# Patient Record
Sex: Male | Born: 1955 | Race: White | Hispanic: No | Marital: Married | Smoking: Former smoker
Health system: Southern US, Community
[De-identification: ages and names within clinical notes are randomized; demographics above are authoritative.]

## PROBLEM LIST (undated history)

## (undated) DIAGNOSIS — M549 Dorsalgia, unspecified: Secondary | ICD-10-CM

## (undated) DIAGNOSIS — E785 Hyperlipidemia, unspecified: Secondary | ICD-10-CM

## (undated) DIAGNOSIS — M1990 Unspecified osteoarthritis, unspecified site: Secondary | ICD-10-CM

## (undated) DIAGNOSIS — K219 Gastro-esophageal reflux disease without esophagitis: Secondary | ICD-10-CM

## (undated) DIAGNOSIS — B019 Varicella without complication: Secondary | ICD-10-CM

## (undated) DIAGNOSIS — E079 Disorder of thyroid, unspecified: Secondary | ICD-10-CM

## (undated) DIAGNOSIS — Z86010 Personal history of colonic polyps: Secondary | ICD-10-CM

## (undated) DIAGNOSIS — Z8619 Personal history of other infectious and parasitic diseases: Secondary | ICD-10-CM

## (undated) DIAGNOSIS — R5383 Other fatigue: Secondary | ICD-10-CM

## (undated) DIAGNOSIS — Z801 Family history of malignant neoplasm of trachea, bronchus and lung: Secondary | ICD-10-CM

## (undated) DIAGNOSIS — R748 Abnormal levels of other serum enzymes: Secondary | ICD-10-CM

## (undated) DIAGNOSIS — Z8042 Family history of malignant neoplasm of prostate: Secondary | ICD-10-CM

## (undated) DIAGNOSIS — I7300 Raynaud's syndrome without gangrene: Secondary | ICD-10-CM

## (undated) DIAGNOSIS — I2510 Atherosclerotic heart disease of native coronary artery without angina pectoris: Secondary | ICD-10-CM

## (undated) DIAGNOSIS — R55 Syncope and collapse: Secondary | ICD-10-CM

## (undated) DIAGNOSIS — N486 Induration penis plastica: Secondary | ICD-10-CM

## (undated) DIAGNOSIS — Z0000 Encounter for general adult medical examination without abnormal findings: Secondary | ICD-10-CM

## (undated) DIAGNOSIS — R0989 Other specified symptoms and signs involving the circulatory and respiratory systems: Secondary | ICD-10-CM

## (undated) DIAGNOSIS — K5900 Constipation, unspecified: Secondary | ICD-10-CM

## (undated) DIAGNOSIS — G709 Myoneural disorder, unspecified: Secondary | ICD-10-CM

## (undated) DIAGNOSIS — R739 Hyperglycemia, unspecified: Secondary | ICD-10-CM

## (undated) DIAGNOSIS — F419 Anxiety disorder, unspecified: Secondary | ICD-10-CM

## (undated) DIAGNOSIS — F418 Other specified anxiety disorders: Secondary | ICD-10-CM

## (undated) DIAGNOSIS — F1010 Alcohol abuse, uncomplicated: Secondary | ICD-10-CM

## (undated) DIAGNOSIS — Z889 Allergy status to unspecified drugs, medicaments and biological substances status: Secondary | ICD-10-CM

## (undated) DIAGNOSIS — E663 Overweight: Secondary | ICD-10-CM

## (undated) DIAGNOSIS — H9190 Unspecified hearing loss, unspecified ear: Secondary | ICD-10-CM

## (undated) DIAGNOSIS — R030 Elevated blood-pressure reading, without diagnosis of hypertension: Secondary | ICD-10-CM

## (undated) DIAGNOSIS — I10 Essential (primary) hypertension: Secondary | ICD-10-CM

## (undated) DIAGNOSIS — R001 Bradycardia, unspecified: Secondary | ICD-10-CM

## (undated) DIAGNOSIS — E039 Hypothyroidism, unspecified: Secondary | ICD-10-CM

## (undated) DIAGNOSIS — I499 Cardiac arrhythmia, unspecified: Secondary | ICD-10-CM

## (undated) DIAGNOSIS — Z800 Family history of malignant neoplasm of digestive organs: Secondary | ICD-10-CM

## (undated) DIAGNOSIS — F1011 Alcohol abuse, in remission: Secondary | ICD-10-CM

## (undated) HISTORY — DX: Family history of malignant neoplasm of trachea, bronchus and lung: Z80.1

## (undated) HISTORY — DX: Personal history of other infectious and parasitic diseases: Z86.19

## (undated) HISTORY — DX: Personal history of colonic polyps: Z86.010

## (undated) HISTORY — DX: Constipation, unspecified: K59.00

## (undated) HISTORY — DX: Essential (primary) hypertension: I10

## (undated) HISTORY — PX: COLONOSCOPY: SHX174

## (undated) HISTORY — DX: Bradycardia, unspecified: R00.1

## (undated) HISTORY — DX: Unspecified hearing loss, unspecified ear: H91.90

## (undated) HISTORY — DX: Dorsalgia, unspecified: M54.9

## (undated) HISTORY — DX: Family history of malignant neoplasm of prostate: Z80.42

## (undated) HISTORY — DX: Other specified anxiety disorders: F41.8

## (undated) HISTORY — DX: Elevated blood-pressure reading, without diagnosis of hypertension: R03.0

## (undated) HISTORY — DX: Disorder of thyroid, unspecified: E07.9

## (undated) HISTORY — PX: NO PAST SURGERIES: SHX2092

## (undated) HISTORY — DX: Family history of malignant neoplasm of digestive organs: Z80.0

## (undated) HISTORY — DX: Hyperglycemia, unspecified: R73.9

## (undated) HISTORY — PX: SKIN BIOPSY: SHX1

## (undated) HISTORY — DX: Raynaud's syndrome without gangrene: I73.00

## (undated) HISTORY — DX: Other fatigue: R53.83

## (undated) HISTORY — DX: Hyperlipidemia, unspecified: E78.5

## (undated) HISTORY — DX: Unspecified osteoarthritis, unspecified site: M19.90

## (undated) HISTORY — DX: Induration penis plastica: N48.6

## (undated) HISTORY — DX: Allergy status to unspecified drugs, medicaments and biological substances status: Z88.9

## (undated) HISTORY — DX: Overweight: E66.3

## (undated) HISTORY — DX: Varicella without complication: B01.9

## (undated) HISTORY — DX: Other specified symptoms and signs involving the circulatory and respiratory systems: R09.89

## (undated) HISTORY — DX: Alcohol abuse, in remission: F10.11

## (undated) HISTORY — DX: Encounter for general adult medical examination without abnormal findings: Z00.00

---

## 2007-04-17 DIAGNOSIS — Z860100 Personal history of colon polyps, unspecified: Secondary | ICD-10-CM

## 2007-04-17 HISTORY — DX: Personal history of colonic polyps: Z86.010

## 2007-04-17 HISTORY — DX: Personal history of colon polyps, unspecified: Z86.0100

## 2010-09-01 ENCOUNTER — Encounter: Payer: Self-pay | Admitting: Family Medicine

## 2010-09-01 ENCOUNTER — Ambulatory Visit (INDEPENDENT_AMBULATORY_CARE_PROVIDER_SITE_OTHER): Payer: BC Managed Care – PPO | Admitting: Family Medicine

## 2010-09-01 DIAGNOSIS — Z889 Allergy status to unspecified drugs, medicaments and biological substances status: Secondary | ICD-10-CM

## 2010-09-01 DIAGNOSIS — E079 Disorder of thyroid, unspecified: Secondary | ICD-10-CM

## 2010-09-01 DIAGNOSIS — E663 Overweight: Secondary | ICD-10-CM

## 2010-09-01 DIAGNOSIS — Z8619 Personal history of other infectious and parasitic diseases: Secondary | ICD-10-CM

## 2010-09-01 DIAGNOSIS — I10 Essential (primary) hypertension: Secondary | ICD-10-CM

## 2010-09-01 DIAGNOSIS — IMO0001 Reserved for inherently not codable concepts without codable children: Secondary | ICD-10-CM

## 2010-09-01 DIAGNOSIS — E785 Hyperlipidemia, unspecified: Secondary | ICD-10-CM

## 2010-09-01 DIAGNOSIS — Z9109 Other allergy status, other than to drugs and biological substances: Secondary | ICD-10-CM

## 2010-09-01 DIAGNOSIS — Z0000 Encounter for general adult medical examination without abnormal findings: Secondary | ICD-10-CM

## 2010-09-01 DIAGNOSIS — J321 Chronic frontal sinusitis: Secondary | ICD-10-CM

## 2010-09-01 DIAGNOSIS — R030 Elevated blood-pressure reading, without diagnosis of hypertension: Secondary | ICD-10-CM

## 2010-09-01 MED ORDER — LISINOPRIL 20 MG PO TABS: 20.0000 mg | ORAL_TABLET | Freq: Every day | ORAL | Status: DC

## 2010-09-01 MED ORDER — AZELASTINE HCL 0.15 % NA SOLN: 2.0000 | NASAL | Status: DC | PRN

## 2010-09-01 NOTE — Patient Instructions (Signed)

## 2010-09-02 ENCOUNTER — Encounter: Payer: Self-pay | Admitting: Family Medicine

## 2010-09-02 DIAGNOSIS — Z8619 Personal history of other infectious and parasitic diseases: Secondary | ICD-10-CM

## 2010-09-02 DIAGNOSIS — E663 Overweight: Secondary | ICD-10-CM

## 2010-09-02 DIAGNOSIS — I10 Essential (primary) hypertension: Secondary | ICD-10-CM | POA: Insufficient documentation

## 2010-09-02 DIAGNOSIS — IMO0001 Reserved for inherently not codable concepts without codable children: Secondary | ICD-10-CM

## 2010-09-02 DIAGNOSIS — Z889 Allergy status to unspecified drugs, medicaments and biological substances status: Secondary | ICD-10-CM

## 2010-09-02 HISTORY — DX: Overweight: E66.3

## 2010-09-02 HISTORY — DX: Personal history of other infectious and parasitic diseases: Z86.19

## 2010-09-02 HISTORY — DX: Allergy status to unspecified drugs, medicaments and biological substances: Z88.9

## 2010-09-02 HISTORY — DX: Essential (primary) hypertension: I10

## 2010-09-02 HISTORY — DX: Reserved for inherently not codable concepts without codable children: IMO0001

## 2010-09-02 NOTE — Assessment & Plan Note (Signed)
Patient reports a history of hypothyroidism which was treated for several months, but never followed up with repeat labs or ongoing prescriptiosn, will check a TSH level before prescribing any further meds

## 2010-09-03 DIAGNOSIS — E785 Hyperlipidemia, unspecified: Secondary | ICD-10-CM | POA: Insufficient documentation

## 2010-09-03 NOTE — Assessment & Plan Note (Signed)
Presently well controlled on Astepro. He report she has previously been treated with nasal steroids but develop excessive pressure in his eyes so they stopped these and his pressure returned to normal. Has moved here from [place] roughly 2 years ago and notes his allergies are much better here. He reports he had allergy testing done 10 years ago which did not show any obvious trigger. He has struggled intermittently was complication as well as chronic sinusitis in the frontal and maxillary regions. Consider a daily nonsedating antihistamine po as needed in future

## 2010-09-03 NOTE — Assessment & Plan Note (Signed)
Patient agrees to return for fasting labs next week. Encouraged to avoid trans fats and consider a fish oil and fiber supplement

## 2010-09-03 NOTE — Progress Notes (Signed)
Zachary Rowe [id number] 24-Jan-1955 09/03/2010      Progress Note New Patient  Subjective    Chief Complaint  Patient presents with  . Establish Care    new patient  Patient is a 55 year old Caucasian male in today to establish care. Moves to [place] roughly 2 years ago from [place]. Since moving here he has had very little difficulty with the allergies he suffered with in [place]. He used to have recurrent frontal and maxillary sinusitis and has suffered with very little of that.  Does not routinely take up oral antihistamines but does use Astepro daily and that seems to keep his congestion and pain and today.  No recent fevers, chills, ear pain, sore throat, chest pain, palpitations, shortness of breath, GI or GU complaints. He is a previous cigarette smoker but unfortunately does continue to chew tobacco intermittently. Past Medical History  Diagnosis Date  . Measles as a child  . Chicken pox as a child  . Chronic sinusitis 01-03-2000  . Hypertension   . Thyroid disease   . Hyperlipidemia   . Multiple allergies 09/02/2010  . History of chicken pox 09/02/2010  . History of measles 09/02/2010  . Overweight 09/02/2010  . Elevated BP 09/02/2010  . HTN (hypertension) 09/02/2010    Past Surgical History  Procedure Date  . Skin biopsy     BB in forehead    Family History  Problem Relation Age of Onset  . Hypertension Mother   . Diabetes Mother     borderline  . Neuropathy Mother   . Hypertension Father   . Diabetes Father     type 2  . Cancer Father 59    liver  . Diabetes Maternal Grandmother   . Emphysema Maternal Grandfather   . Stroke Paternal Grandfather     History   Social History  . Marital Status: Married    Spouse Name: N/A    Number of Children: N/A  . Years of Education: N/A   Occupational History  . Not on file.   Social History Main Topics  . Smoking status: Former Smoker    Types: Cigarettes    Quit date: 01/03/2007  . Smokeless  tobacco: Current User    Types: Chew   Comment: dip  . Alcohol Use: Yes     3-4 bottles of wine weekly  . Drug Use: No  . Sexually Active: Yes -- Male partner(s)   Other Topics Concern  . Not on file   Social History Narrative  . No narrative on file    No current outpatient prescriptions on file prior to visit.    No Known Allergies  Review of Systems  Review of Systems  Constitutional: Negative for fever, chills and malaise/fatigue.  HENT: Negative for hearing loss, nosebleeds and congestion.   Eyes: Negative for discharge.  Respiratory: Negative for cough, sputum production, shortness of breath and wheezing.   Cardiovascular: Negative for chest pain, palpitations and leg swelling.  Gastrointestinal: Negative for heartburn, nausea, vomiting, abdominal pain, diarrhea, constipation and blood in stool.  Genitourinary: Negative for dysuria, urgency, frequency and hematuria.  Musculoskeletal: Negative for myalgias, back pain and falls.  Skin: Negative for rash.  Neurological: Negative for dizziness, tremors, sensory change, focal weakness, loss of consciousness, weakness and headaches.  Endo/Heme/Allergies: Negative for polydipsia. Does not bruise/bleed easily.  Psychiatric/Behavioral: Negative for depression and suicidal ideas. The patient is not nervous/anxious and does not have insomnia.     Objective  BP 141/87  Pulse  60  Temp(Src) 98.1 F (36.7 C) (Oral)  Ht 6\' 3"  (1.905 m)  Wt 217 lb (98.431 kg)  BMI 27.12 kg/m2  SpO2 98%  Physical Exam  Physical Exam  Constitutional: He is oriented to person, place, and time and well-developed, well-nourished, and in no distress. No distress.  HENT:  Head: Normocephalic and atraumatic.  Right Ear: External ear normal.  Left Ear: External ear normal.  Nose: Nose normal.  Mouth/Throat: Oropharynx is clear and moist. No oropharyngeal exudate.  Eyes: Conjunctivae are normal. Pupils are equal, round, and reactive to light.  Right eye exhibits no discharge. Left eye exhibits no discharge. No scleral icterus.  Neck: Normal range of motion. Neck supple. No thyromegaly present.  Cardiovascular: Normal rate, regular rhythm and normal heart sounds.   No murmur heard. Pulmonary/Chest: Effort normal and breath sounds normal. No respiratory distress. He has no wheezes.  Abdominal: He exhibits no distension and no mass. There is no tenderness. There is no guarding.  Musculoskeletal: He exhibits no edema and no tenderness.  Lymphadenopathy:    He has no cervical adenopathy.  Neurological: He is alert and oriented to person, place, and time. He has normal reflexes. He displays normal reflexes. No cranial nerve deficit. He exhibits normal muscle tone. Gait normal. Coordination normal. GCS score is 15.  Skin: Skin is warm and dry. No rash noted. He is not diaphoretic. No erythema. No pallor.  Psychiatric: Mood, memory, affect and judgment normal.       Assessment & Plan  Thyroid disease Patient reports a history of hypothyroidism which was treated for several months, but never followed up with repeat labs or ongoing prescriptiosn, will check a TSH level before prescribing any further meds  Multiple allergies Presently well controlled on Astepro. He report she has previously been treated with nasal steroids but develop excessive pressure in his eyes so they stopped these and his pressure returned to normal. Has moved here from [place] roughly 2 years ago and notes his allergies are much better here. He reports he had allergy testing done 10 years ago which did not show any obvious trigger. He has struggled intermittently was complication as well as chronic sinusitis in the frontal and maxillary regions. Consider a daily nonsedating antihistamine po as needed in future  HTN (hypertension) Mild elevation on Lisinopril once daily. He is asked to minimize salt, get adequate sleep and we will reassess at next  visit.  Hyperlipidemia Patient agrees to return for fasting labs next week. Encouraged to avoid trans fats and consider a fish oil and fiber supplement

## 2010-09-03 NOTE — Assessment & Plan Note (Signed)
Mild elevation on Lisinopril once daily. He is asked to minimize salt, get adequate sleep and we will reassess at next visit.

## 2010-09-06 ENCOUNTER — Other Ambulatory Visit: Payer: BC Managed Care – PPO

## 2010-09-08 ENCOUNTER — Telehealth: Payer: Self-pay

## 2010-09-08 ENCOUNTER — Other Ambulatory Visit: Payer: Self-pay | Admitting: Family Medicine

## 2010-09-08 ENCOUNTER — Other Ambulatory Visit (INDEPENDENT_AMBULATORY_CARE_PROVIDER_SITE_OTHER): Payer: BC Managed Care – PPO

## 2010-09-08 DIAGNOSIS — Z0000 Encounter for general adult medical examination without abnormal findings: Secondary | ICD-10-CM

## 2010-09-08 DIAGNOSIS — E079 Disorder of thyroid, unspecified: Secondary | ICD-10-CM

## 2010-09-08 DIAGNOSIS — E785 Hyperlipidemia, unspecified: Secondary | ICD-10-CM

## 2010-09-08 DIAGNOSIS — I10 Essential (primary) hypertension: Secondary | ICD-10-CM

## 2010-09-08 LAB — RENAL FUNCTION PANEL
BUN: 17 mg/dL (ref 6–23)
CO2: 29 mEq/L (ref 19–32)
Creatinine, Ser: 1.2 mg/dL (ref 0.4–1.5)
GFR: 65.36 mL/min (ref >60.00)
Glucose, Bld: 98 mg/dL (ref 70–99)
Sodium: 139 mEq/L (ref 135–145)

## 2010-09-08 LAB — CBC WITH DIFFERENTIAL/PLATELET
Basophils Absolute: 0 10*3/uL (ref 0.0–0.1)
Basophils Relative: 0.5 % (ref 0.0–3.0)
Eosinophils Absolute: 0.2 10*3/uL (ref 0.0–0.7)
Hemoglobin: 14.3 g/dL (ref 13.0–17.0)
MCHC: 33.4 g/dL (ref 30.0–36.0)
MCV: 97.7 fl (ref 78.0–100.0)
Monocytes Absolute: 0.5 10*3/uL (ref 0.1–1.0)
Neutro Abs: 3.1 10*3/uL (ref 1.4–7.7)
Neutrophils Relative %: 61.5 % (ref 43.0–77.0)
RBC: 4.39 Mil/uL (ref 4.22–5.81)
RDW: 13.5 % (ref 11.5–14.6)

## 2010-09-08 LAB — HEPATIC FUNCTION PANEL
AST: 19 U/L (ref 0–37)
Albumin: 4.3 g/dL (ref 3.5–5.2)
Total Bilirubin: 0.7 mg/dL (ref 0.3–1.2)

## 2010-09-08 LAB — LIPID PANEL
Cholesterol: 208 mg/dL — ABNORMAL HIGH (ref 0–200)
Total CHOL/HDL Ratio: 3
Triglycerides: 53 mg/dL (ref 0.0–149.0)
VLDL: 10.6 mg/dL (ref 0.0–40.0)

## 2010-09-08 LAB — PSA: PSA: 0.33 ng/mL (ref 0.10–4.00)

## 2010-09-08 LAB — TSH: TSH: 8.04 u[IU]/mL — ABNORMAL HIGH (ref 0.35–5.50)

## 2010-09-08 NOTE — Telephone Encounter (Signed)
FYI: Pt would like future medication to go to [facility]

## 2010-09-09 ENCOUNTER — Telehealth: Payer: Self-pay

## 2010-09-09 DIAGNOSIS — I10 Essential (primary) hypertension: Secondary | ICD-10-CM

## 2010-09-09 MED ORDER — LISINOPRIL 20 MG PO TABS: 20.0000 mg | ORAL_TABLET | Freq: Every day | ORAL | Status: DC

## 2010-09-09 MED ORDER — LEVOTHYROXINE SODIUM 25 MCG PO TABS: 25.0000 ug | ORAL_TABLET | Freq: Every day | ORAL | Status: DC

## 2010-09-09 NOTE — Telephone Encounter (Signed)
Pt called and left a message stating that he would like md to return his call about the information he received about his labs earlier today. His callback number is [phone].

## 2010-09-09 NOTE — Telephone Encounter (Signed)
RX sent to [facility]

## 2010-09-09 NOTE — Telephone Encounter (Signed)
Spoke with patient over the phone, he agrees to start the Levothyroxine due to his subclinical hypothyroid. Also he is requesting a prescription of his Lisinopril get sent to [facility], he can have a 90 day supply with 3 rf

## 2010-11-01 ENCOUNTER — Encounter: Payer: Self-pay | Admitting: Family Medicine

## 2010-11-01 ENCOUNTER — Ambulatory Visit (INDEPENDENT_AMBULATORY_CARE_PROVIDER_SITE_OTHER): Payer: BC Managed Care – PPO | Admitting: Family Medicine

## 2010-11-01 DIAGNOSIS — I10 Essential (primary) hypertension: Secondary | ICD-10-CM

## 2010-11-01 DIAGNOSIS — F1010 Alcohol abuse, uncomplicated: Secondary | ICD-10-CM

## 2010-11-01 DIAGNOSIS — R5381 Other malaise: Secondary | ICD-10-CM

## 2010-11-01 DIAGNOSIS — R5383 Other fatigue: Secondary | ICD-10-CM | POA: Insufficient documentation

## 2010-11-01 DIAGNOSIS — E875 Hyperkalemia: Secondary | ICD-10-CM

## 2010-11-01 DIAGNOSIS — E079 Disorder of thyroid, unspecified: Secondary | ICD-10-CM

## 2010-11-01 DIAGNOSIS — E039 Hypothyroidism, unspecified: Secondary | ICD-10-CM

## 2010-11-01 DIAGNOSIS — F1011 Alcohol abuse, in remission: Secondary | ICD-10-CM

## 2010-11-01 HISTORY — DX: Alcohol abuse, in remission: F10.11

## 2010-11-01 HISTORY — DX: Other fatigue: R53.83

## 2010-11-01 LAB — RENAL FUNCTION PANEL
BUN: 17 mg/dL (ref 6–23)
Creatinine, Ser: 1 mg/dL (ref 0.4–1.5)
GFR: 87.18 mL/min (ref >60.00)
Glucose, Bld: 92 mg/dL (ref 70–99)
Phosphorus: 3.6 mg/dL (ref 2.3–4.6)
Sodium: 140 mEq/L (ref 135–145)

## 2010-11-01 LAB — T4, FREE: Free T4: 0.96 ng/dL (ref 0.60–1.60)

## 2010-11-01 LAB — TSH: TSH: 1.66 u[IU]/mL (ref 0.35–5.50)

## 2010-11-01 NOTE — Assessment & Plan Note (Signed)
Patient notes a history of roughly a bottle and a half of wine nightly. He reports he quit cold [place] a week ago and other than feeling a little tired and anxious he reports so further difficulty. No tremulous, hallucinations or other concerns. Agrees to continue to abstain

## 2010-11-01 NOTE — Assessment & Plan Note (Signed)
Likely multifactorial but with his HTN, bradycardia  And alcohol history we will check a 2d Echo to further evaluate.

## 2010-11-01 NOTE — Assessment & Plan Note (Signed)
Sent for repeat labs today. No need for change in medications today, continue Levothyroxine at the previous dose

## 2010-11-01 NOTE — Progress Notes (Signed)
EDMON MAGID [id number] [birth year] 11/01/2010      Progress Note-Follow Up  Subjective  Chief Complaint  Chief Complaint  Patient presents with  . Follow-up    2 month follow up- on thyroid    HPI  Caucasian male who is in today for followup on hypertension and multiple other medical problems. He reports one week ago he decided abruptly to quit drinking alcohol. He denies any inciting event but realized he continued to much for too long. Was up to a bottle and a half of wine a night. He denies any hallucinations, tremulousness, headaches, chest pain, palpitations, shortness of breath as a result. He notes he has had some mild increase in anxiety. His mother has found a mass in her stomach and lives in [place]. He may need to go out to help her with that so he does acknowledge increased stress at the moment. No other acute illness or concerns noted at today's visit.  Past Medical History  Diagnosis Date  . Measles as a child  . Chicken pox as a child  . Chronic sinusitis 01-03-2000  . Hypertension   . Thyroid disease   . Hyperlipidemia   . Multiple allergies 09/02/2010  . History of chicken pox 09/02/2010  . History of measles 09/02/2010  . Overweight 09/02/2010  . Elevated BP 09/02/2010  . HTN (hypertension) 09/02/2010  . 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 Fatigue 11/01/2010    Past Surgical History  Procedure Date  . Skin biopsy     BB in forehead    Family History  Problem Relation Age of Onset  . Hypertension Mother   . Diabetes Mother     borderline  . Neuropathy Mother   . Hypertension Father   . Diabetes Father     type 2  . Cancer Father 44    liver  . Diabetes Maternal Grandmother   . Emphysema Maternal Grandfather   . Stroke Paternal Grandfather     History   Social History  . Marital Status: Married    Spouse Name: N/A    Number of Children: N/A  . Years of Education: N/A   Occupational History  . Not on file.   Social History Main  Topics  . Smoking status: Former Smoker    Types: Cigarettes    Quit date: 01/03/2007  . Smokeless tobacco: Current User    Types: Chew   Comment: dip  . Alcohol Use: Yes     3-4 bottles of wine weekly  . Drug Use: No  . Sexually Active: Yes -- Male partner(s)   Other Topics Concern  . Not on file   Social History Narrative  . No narrative on file    Current Outpatient Prescriptions on File Prior to Visit  Medication Sig Dispense Refill  . aspirin 81 MG tablet Take 81 mg by mouth daily.        . Azelastine HCl (ASTEPRO) 0.15 % SOLN Place 2 sprays into the nose as needed (allergies/sinusitis).  30 mL  3  . levothyroxine (LEVOTHROID) 25 MCG tablet Take 1 tablet (25 mcg total) by mouth daily.  30 tablet  1  . lisinopril (PRINIVIL,ZESTRIL) 20 MG tablet Take 1 tablet (20 mg total) by mouth daily.  90 tablet  1    No Known Allergies  Review of Systems  Review of Systems  Constitutional: Positive for malaise/fatigue. Negative for fever.  HENT: Negative for congestion.   Eyes: Negative for discharge.  Respiratory: Negative for  shortness of breath.   Cardiovascular: Negative for chest pain, palpitations and leg swelling.  Gastrointestinal: Negative for nausea, abdominal pain and diarrhea.  Genitourinary: Negative for dysuria.  Musculoskeletal: Negative for falls.  Skin: Negative for rash.  Neurological: Negative for loss of consciousness and headaches.  Endo/Heme/Allergies: Negative for polydipsia.  Psychiatric/Behavioral: Negative for depression and suicidal ideas. The patient is not nervous/anxious and does not have insomnia.     Objective  BP 150/84  Pulse 54  Temp(Src) 97.7 F (36.5 C) (Oral)  Ht 6\' 3"  (1.905 m)  Wt 224 lb 6.4 oz (101.787 kg)  BMI 28.05 kg/m2  SpO2 98%  Physical Exam  Physical Exam  Constitutional: He is oriented to person, place, and time and well-developed, well-nourished, and in no distress. No distress.  HENT:  Head: Normocephalic and  atraumatic.  Eyes: Conjunctivae are normal.  Neck: Neck supple. No thyromegaly present.  Cardiovascular: Normal rate, regular rhythm and normal heart sounds.   No murmur heard. Pulmonary/Chest: Effort normal and breath sounds normal. No respiratory distress.  Abdominal: He exhibits no distension and no mass. There is no tenderness.  Musculoskeletal: He exhibits no edema.  Neurological: He is alert and oriented to person, place, and time.  Skin: Skin is warm.  Psychiatric: Memory, affect and judgment normal.    Lab Results  Component Value Date   TSH 1.66 11/01/2010   Lab Results  Component Value Date   WBC 5.0 09/08/2010   HGB 14.3 09/08/2010   HCT 42.9 09/08/2010   MCV 97.7 09/08/2010   PLT 270.0 09/08/2010   Lab Results  Component Value Date   CREATININE 1.0 11/01/2010   BUN 17 11/01/2010   NA 140 11/01/2010   K 5.2* 11/01/2010   CL 105 11/01/2010   CO2 29 11/01/2010   Lab Results  Component Value Date   ALT 18 09/08/2010   AST 19 09/08/2010   ALKPHOS 41 09/08/2010   BILITOT 0.7 09/08/2010   Lab Results  Component Value Date   CHOL 208* 09/08/2010   Lab Results  Component Value Date   HDL 62.20 09/08/2010   No results found for this basename: [facility]   Lab Results  Component Value Date   TRIG 53.0 09/08/2010   Lab Results  Component Value Date   CHOLHDL 3 09/08/2010     Assessment & Plan  Thyroid disease Sent for repeat labs today. No need for change in medications today, continue Levothyroxine at the previous dose   Fatigue Likely multifactorial but with his HTN, bradycardia  And alcohol history we will check a 2d Echo to further evaluate.  Alcohol abuse, in remission Patient notes a history of roughly a bottle and a half of wine nightly. He reports he quit cold [place] a week ago and other than feeling a little tired and anxious he reports so further difficulty. No tremulous, hallucinations or other concerns. Agrees to continue to abstain  Hyperkalemia Very mild and  slightly improved from last blood draw. Minimize K in the diet and we may need to consider cutting back on his Lisinopril in the future. No sign of kidney dysfunction is noted.  HTN (hypertension) Will continue Lisinopril at current dosing for now. BP likely up somewhat due to recent alcohol cessation. Reports he has been off of alcohol for an entire week thus far so no need for further medications in that regard at this time

## 2010-11-01 NOTE — Assessment & Plan Note (Signed)
Very mild and slightly improved from last blood draw. Minimize K in the diet and we may need to consider cutting back on his Lisinopril in the future. No sign of kidney dysfunction is noted.

## 2010-11-01 NOTE — Assessment & Plan Note (Signed)
Will continue Lisinopril at current dosing for now. BP likely up somewhat due to recent alcohol cessation. Reports he has been off of alcohol for an entire week thus far so no need for further medications in that regard at this time

## 2010-11-01 NOTE — Patient Instructions (Signed)

## 2010-11-06 ENCOUNTER — Encounter: Payer: Self-pay | Admitting: Family Medicine

## 2010-11-06 DIAGNOSIS — E039 Hypothyroidism, unspecified: Secondary | ICD-10-CM

## 2010-11-07 ENCOUNTER — Encounter: Payer: Self-pay | Admitting: Family Medicine

## 2010-11-07 ENCOUNTER — Ambulatory Visit (HOSPITAL_COMMUNITY): Payer: BC Managed Care – PPO | Attending: Family Medicine | Admitting: Radiology

## 2010-11-07 DIAGNOSIS — I059 Rheumatic mitral valve disease, unspecified: Secondary | ICD-10-CM | POA: Insufficient documentation

## 2010-11-07 DIAGNOSIS — I379 Nonrheumatic pulmonary valve disorder, unspecified: Secondary | ICD-10-CM | POA: Insufficient documentation

## 2010-11-07 DIAGNOSIS — I079 Rheumatic tricuspid valve disease, unspecified: Secondary | ICD-10-CM | POA: Insufficient documentation

## 2010-11-07 DIAGNOSIS — I10 Essential (primary) hypertension: Secondary | ICD-10-CM

## 2010-11-07 DIAGNOSIS — I517 Cardiomegaly: Secondary | ICD-10-CM

## 2010-11-07 DIAGNOSIS — R5383 Other fatigue: Secondary | ICD-10-CM

## 2010-11-07 DIAGNOSIS — F1010 Alcohol abuse, uncomplicated: Secondary | ICD-10-CM

## 2010-11-07 DIAGNOSIS — I319 Disease of pericardium, unspecified: Secondary | ICD-10-CM | POA: Insufficient documentation

## 2010-11-07 DIAGNOSIS — R5381 Other malaise: Secondary | ICD-10-CM | POA: Insufficient documentation

## 2010-11-07 MED ORDER — LEVOTHYROXINE SODIUM 25 MCG PO TABS: 25.0000 ug | ORAL_TABLET | Freq: Every day | ORAL | Status: DC

## 2010-11-07 NOTE — Telephone Encounter (Signed)
Please advise mychart message.

## 2010-12-09 ENCOUNTER — Encounter: Payer: Self-pay | Admitting: Family Medicine

## 2010-12-09 MED ORDER — LEVOTHYROXINE SODIUM 25 MCG PO TABS: 25.0000 ug | ORAL_TABLET | Freq: Every day | ORAL | Status: DC

## 2010-12-19 ENCOUNTER — Telehealth: Payer: Self-pay

## 2010-12-19 NOTE — Telephone Encounter (Signed)
Received paperwork from [facility] stating that Levothroid is on manuf back order? Per md ok to switch to Levothyrox 0.025mg  along as pt is willing to make the switch? Per patient its okay to switch medication

## 2010-12-22 ENCOUNTER — Telehealth: Payer: Self-pay

## 2010-12-22 ENCOUNTER — Other Ambulatory Visit: Payer: Self-pay

## 2010-12-22 ENCOUNTER — Encounter: Payer: Self-pay | Admitting: Family Medicine

## 2010-12-22 DIAGNOSIS — E039 Hypothyroidism, unspecified: Secondary | ICD-10-CM

## 2010-12-22 MED ORDER — LEVOTHYROXINE SODIUM 25 MCG PO TABS: 25.0000 ug | ORAL_TABLET | Freq: Every day | ORAL | Status: DC

## 2010-12-22 NOTE — Telephone Encounter (Signed)
Pt sent an echart message stating that he had called [facility] and they were stating they didn't have pts RX for Levothyroxine? I called and talked to [facility] [phone]) and she put me on hold and then came back on the phone to tell me that they need pt to call them because they need the patients [facility] card number? I informed patient that the medication was sent to the pharmacy on 12-19-10 at 9:05 AM. I informed patient that I can send a 1 month supply to a convenient pharmacy? Pt stated he was going to call them and would call us back if we need to send it to a closer pharmacy.

## 2010-12-26 ENCOUNTER — Other Ambulatory Visit: Payer: Self-pay | Admitting: *Deleted

## 2010-12-26 DIAGNOSIS — E039 Hypothyroidism, unspecified: Secondary | ICD-10-CM

## 2010-12-26 MED ORDER — LEVOTHYROXINE SODIUM 25 MCG PO TABS: 25.0000 ug | ORAL_TABLET | Freq: Every day | ORAL | Status: DC

## 2010-12-26 NOTE — Telephone Encounter (Signed)
RX sent on 12/22/10 #30 x 5.  90-day supply x 1 refill sent to [facility].

## 2011-04-11 ENCOUNTER — Encounter: Payer: Self-pay | Admitting: Family Medicine

## 2011-04-11 ENCOUNTER — Ambulatory Visit (INDEPENDENT_AMBULATORY_CARE_PROVIDER_SITE_OTHER): Payer: BC Managed Care – PPO | Admitting: Family Medicine

## 2011-04-11 VITALS — BP 124/84 | HR 51 | Temp 97.9°F | Ht 75.0 in | Wt 226.8 lb

## 2011-04-11 DIAGNOSIS — F1011 Alcohol abuse, in remission: Secondary | ICD-10-CM

## 2011-04-11 DIAGNOSIS — I7300 Raynaud's syndrome without gangrene: Secondary | ICD-10-CM

## 2011-04-11 DIAGNOSIS — E079 Disorder of thyroid, unspecified: Secondary | ICD-10-CM

## 2011-04-11 DIAGNOSIS — I10 Essential (primary) hypertension: Secondary | ICD-10-CM

## 2011-04-11 DIAGNOSIS — E039 Hypothyroidism, unspecified: Secondary | ICD-10-CM

## 2011-04-11 DIAGNOSIS — E875 Hyperkalemia: Secondary | ICD-10-CM

## 2011-04-11 HISTORY — DX: Raynaud's syndrome without gangrene: I73.00

## 2011-04-11 LAB — RENAL FUNCTION PANEL
CO2: 25 mEq/L (ref 19–32)
Calcium: 9.2 mg/dL (ref 8.4–10.5)
Chloride: 105 mEq/L (ref 96–112)
Glucose, Bld: 95 mg/dL (ref 70–99)
Potassium: 4.4 mEq/L (ref 3.5–5.1)
Sodium: 141 mEq/L (ref 135–145)

## 2011-04-11 LAB — CBC
Platelets: 251 10*3/uL (ref 150.0–400.0)
RBC: 4.46 Mil/uL (ref 4.22–5.81)
WBC: 4.9 10*3/uL (ref 4.5–10.5)

## 2011-04-11 LAB — TSH: TSH: 5.01 u[IU]/mL (ref 0.35–5.50)

## 2011-04-11 NOTE — Assessment & Plan Note (Signed)
Check tsh and freet4

## 2011-04-11 NOTE — Assessment & Plan Note (Signed)
Doing well repeat numbers improved

## 2011-04-11 NOTE — Assessment & Plan Note (Addendum)
May consider increasing Aspirin to twice a day. Encouraged to take [facility] daily, wear Smart [employer] daily. Increase exercise, report worsening symptoms

## 2011-04-11 NOTE — Progress Notes (Signed)
Patient ID: Zachary Rowe, male   DOB: [birth year], 56 y.o.   MRN: [id number] NAAMAN CURRO [id number] [birth year] 04/11/2011      Progress Note-Follow Up  Subjective  Chief Complaint  Chief Complaint  Patient presents with  . numbness    in toes    HPI  This 75 are old Caucasian male in today complaining of some trouble with his toes. He reports over the winter when he was sitting for prolonged periods of time doing some sewing he had some redness and purple color with pain in his left second and third toes as well as his right second toe when he would get up move around and warm up the pain and redness resolved. This is a new phenomenon hearing is present time. He denies any other back pain, hip pain, radicular symptoms. He's had no recent falls or traumas. He denies any polyuria polydipsia increased fatigue or signs of glucose dysfunction. He had surgery Amounts again. Reports 2-3 beverages a night. We'll have to glasses wine and occasional scotch. Does not feel he is getting away from him the way he has in the past. He is a previous smoker but has not smoked for many years  Past Medical History  Diagnosis Date  . Measles as a child  . Chicken pox as a child  . Chronic sinusitis 01-03-2000  . Hypertension   . Thyroid disease   . Hyperlipidemia   . Multiple allergies 09/02/2010  . History of chicken pox 09/02/2010  . History of measles 09/02/2010  . Overweight 09/02/2010  . Elevated BP 09/02/2010  . HTN (hypertension) 09/02/2010  . 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 Fatigue 11/01/2010  . Raynaud disease 04/11/2011    Past Surgical History  Procedure Date  . Skin biopsy     BB in forehead    Family History  Problem Relation Age of Onset  . Hypertension Mother   . Diabetes Mother     borderline  . Neuropathy Mother   . Hypertension Father   . Diabetes Father     type 2  . Cancer Father 62    liver  . Diabetes Maternal Grandmother   . Emphysema Maternal Grandfather     . Stroke Paternal Grandfather     History   Social History  . Marital Status: Married    Spouse Name: N/A    Number of Children: N/A  . Years of Education: N/A   Occupational History  . Not on file.   Social History Main Topics  . Smoking status: Former Smoker    Types: Cigarettes    Quit date: 01/03/2007  . Smokeless tobacco: Current User    Types: Chew   Comment: dip  . Alcohol Use: Yes     3-4 bottles of wine weekly  . Drug Use: No  . Sexually Active: Yes -- Male partner(s)   Other Topics Concern  . Not on file   Social History Narrative  . No narrative on file    Current Outpatient Prescriptions on File Prior to Visit  Medication Sig Dispense Refill  . aspirin 81 MG tablet Take 81 mg by mouth daily.        . Azelastine HCl (ASTEPRO) 0.15 % SOLN Place 2 sprays into the nose as needed (allergies/sinusitis).  30 mL  3  . levothyroxine (LEVOTHROID) 25 MCG tablet Take 1 tablet (25 mcg total) by mouth daily.  90 tablet  1  . lisinopril (PRINIVIL,ZESTRIL) 20 MG tablet  Take 1 tablet (20 mg total) by mouth daily.  90 tablet  1    No Known Allergies  Review of Systems  Review of Systems  Constitutional: Negative for fever and malaise/fatigue.  HENT: Negative for congestion.   Eyes: Negative for discharge.  Respiratory: Negative for shortness of breath.   Cardiovascular: Negative for chest pain, palpitations and leg swelling.  Gastrointestinal: Negative for nausea, abdominal pain and diarrhea.  Genitourinary: Negative for dysuria.  Musculoskeletal: Negative for falls.       Describes pain, red/purple in toes left 2 and 3 and right 2 after prolonged sitting in a cold room. Resolves when he warms and moves around  Skin: Negative for rash.  Neurological: Negative for loss of consciousness and headaches.  Endo/Heme/Allergies: Negative for polydipsia.  Psychiatric/Behavioral: Negative for depression and suicidal ideas. The patient is not nervous/anxious and does not  have insomnia.     Objective  BP 124/84  Pulse 51  Temp(Src) 97.9 F (36.6 C) (Temporal)  Ht 6\' 3"  (1.905 m)  Wt 226 lb 12.8 oz (102.876 kg)  BMI 28.35 kg/m2  SpO2 99%  Physical Exam  Physical Exam  Constitutional: He is oriented to person, place, and time and well-developed, well-nourished, and in no distress. No distress.  HENT:  Head: Normocephalic and atraumatic.  Eyes: Conjunctivae are normal.  Neck: Neck supple. No thyromegaly present.  Cardiovascular: Normal rate and regular rhythm.  Exam reveals no gallop.   No murmur heard. Pulmonary/Chest: Effort normal and breath sounds normal. No respiratory distress.  Abdominal: He exhibits no distension and no mass. There is no tenderness.  Musculoskeletal: He exhibits no edema.  Neurological: He is alert and oriented to person, place, and time.  Skin: Skin is warm.  Psychiatric: Memory, affect and judgment normal.    Lab Results  Component Value Date   TSH 1.66 11/01/2010   Lab Results  Component Value Date   WBC 5.0 09/08/2010   HGB 14.3 09/08/2010   HCT 42.9 09/08/2010   MCV 97.7 09/08/2010   PLT 270.0 09/08/2010   Lab Results  Component Value Date   CREATININE 1.0 11/01/2010   BUN 17 11/01/2010   NA 140 11/01/2010   K 5.2* 11/01/2010   CL 105 11/01/2010   CO2 29 11/01/2010   Lab Results  Component Value Date   ALT 18 09/08/2010   AST 19 09/08/2010   ALKPHOS 41 09/08/2010   BILITOT 0.7 09/08/2010   Lab Results  Component Value Date   CHOL 208* 09/08/2010   Lab Results  Component Value Date   HDL 62.20 09/08/2010   No results found for this basename: [facility]   Lab Results  Component Value Date   TRIG 53.0 09/08/2010   Lab Results  Component Value Date   CHOLHDL 3 09/08/2010     Assessment & Plan  Raynaud disease May consider increasing Aspirin to twice a day. Encouraged to take [facility] daily, wear Smart [employer] daily. Increase exercise, report worsening symptoms  HTN (hypertension) Doing well repeat  numbers improved  Alcohol abuse, in remission Has begun to drink again 2-3 beverages nightly. Encouraged to start Thiamine and Folic Acid daily and encouraged to be very careflu with his consumption  Hyperkalemia Repeat renal today  Thyroid disease Check tsh and freet4

## 2011-04-11 NOTE — Patient Instructions (Signed)
Raynaud's Syndrome Raynaud's Syndrome is a disorder of the blood vessels in your hands and feet. It occurs when small arteries of the arms/hands or legs/feet become sensitive to cold or emotional upset. This causes the arteries to constrict, or narrow, and reduces blood flow to the area. The color in the fingers or toes changes from white to bluish to red and this is not usually painful. There may be numbness and tingling. Sores on the skin (ulcers) can form. Symptoms are usually relieved by warming. HOME CARE INSTRUCTIONS   Avoid exposure to cold. Keep your whole body warm and dry. Dress in layers. Wear mittens or gloves when handling ice or frozen food and when outdoors. Use holders for glasses or cans containing cold drinks. If possible, stay indoors during cold weather.   Limit your use of caffeine. Switch to decaffeinated coffee, tea, and soda pop. Avoid chocolate.   Avoid smoking or being around cigarette smoke. Smoke will make symptoms worse.   Wear loose fitting socks and comfortable, roomy shoes.   Avoid vibrating tools and machinery.   If possible, avoid stressful and emotional situations. Exercise, meditation and yoga may help you cope with stress. Biofeedback may be useful.   Ask your caregiver about medicine (calcium channel blockers) that may control Raynaud's phenomena.  SEEK MEDICAL CARE IF:   Your discomfort becomes worse, despite conservative treatment.   You develop sores on your fingers and toes that do not heal.  Document Released: 12/17/1999 Document Revised: 12/08/2010 Document Reviewed: 12/24/2007 [facility] Patient Information 2012 [place], [place].   Start Thiamine 100mg  daily and a Folic Acid 100mg  daily  Can increasing aspirin to twice daily and get some smart wool socks, megared daily

## 2011-04-11 NOTE — Assessment & Plan Note (Signed)
Has begun to drink again 2-3 beverages nightly. Encouraged to start Thiamine and Folic Acid daily and encouraged to be very careflu with his consumption

## 2011-04-11 NOTE — Assessment & Plan Note (Signed)
Repeat renal today 

## 2011-04-12 LAB — T4, FREE: Free T4: 0.87 ng/dL (ref 0.60–1.60)

## 2011-04-14 ENCOUNTER — Encounter: Payer: Self-pay | Admitting: Family Medicine

## 2011-06-07 ENCOUNTER — Other Ambulatory Visit: Payer: Self-pay | Admitting: Family Medicine

## 2011-06-23 ENCOUNTER — Encounter: Payer: Self-pay | Admitting: Family Medicine

## 2011-06-23 NOTE — Telephone Encounter (Signed)
Please advise mychart question? 

## 2011-08-11 ENCOUNTER — Telehealth: Payer: Self-pay

## 2011-08-11 ENCOUNTER — Encounter: Payer: Self-pay | Admitting: Family Medicine

## 2011-08-11 MED ORDER — LEVOTHYROXINE SODIUM 25 MCG PO TABS: 25.0000 ug | ORAL_TABLET | Freq: Every day | ORAL | Status: DC

## 2011-08-11 NOTE — Telephone Encounter (Signed)
RX sent to pharmacy  

## 2011-08-14 ENCOUNTER — Other Ambulatory Visit: Payer: Self-pay | Admitting: Family Medicine

## 2011-12-14 ENCOUNTER — Other Ambulatory Visit: Payer: Self-pay | Admitting: Family Medicine

## 2012-01-25 ENCOUNTER — Other Ambulatory Visit: Payer: Self-pay | Admitting: Family Medicine

## 2012-01-25 ENCOUNTER — Encounter: Payer: Self-pay | Admitting: Family Medicine

## 2012-02-17 ENCOUNTER — Other Ambulatory Visit: Payer: Self-pay

## 2012-05-21 ENCOUNTER — Encounter: Payer: Self-pay | Admitting: Family Medicine

## 2012-06-24 ENCOUNTER — Telehealth: Payer: Self-pay | Admitting: Family Medicine

## 2012-06-24 NOTE — Telephone Encounter (Signed)
Refill- astepro 0.15% spr. Use two spray as needed. (needs appointment for additional refills). Qty 30 last fill 12.12.2013

## 2012-07-22 ENCOUNTER — Other Ambulatory Visit: Payer: Self-pay | Admitting: Family Medicine

## 2012-07-22 MED ORDER — LISINOPRIL 20 MG PO TABS: 20.0000 mg | ORAL_TABLET | Freq: Every day | ORAL | Status: DC

## 2012-07-22 NOTE — Addendum Note (Signed)
Addended by: Court Joy on: 07/22/2012 09:25 AM   Modules accepted: Orders

## 2012-07-25 ENCOUNTER — Encounter: Payer: Self-pay | Admitting: Family Medicine

## 2012-07-29 ENCOUNTER — Other Ambulatory Visit: Payer: Self-pay | Admitting: *Deleted

## 2012-07-29 MED ORDER — AZELASTINE HCL 0.15 % NA SOLN: NASAL | Status: DC

## 2012-07-29 NOTE — Telephone Encounter (Signed)
Rx request to pharmacy; **Office Visit Needed Prior to Future Refills**/SLS  

## 2012-08-12 ENCOUNTER — Encounter: Payer: Self-pay | Admitting: Family Medicine

## 2012-08-12 ENCOUNTER — Other Ambulatory Visit: Payer: Self-pay | Admitting: Family Medicine

## 2012-08-12 ENCOUNTER — Ambulatory Visit (INDEPENDENT_AMBULATORY_CARE_PROVIDER_SITE_OTHER): Payer: BC Managed Care – PPO | Admitting: Family Medicine

## 2012-08-12 VITALS — BP 104/68 | HR 65 | Temp 98.0°F | Ht 75.0 in | Wt 215.0 lb

## 2012-08-12 DIAGNOSIS — R5383 Other fatigue: Secondary | ICD-10-CM

## 2012-08-12 DIAGNOSIS — R5381 Other malaise: Secondary | ICD-10-CM

## 2012-08-12 DIAGNOSIS — E079 Disorder of thyroid, unspecified: Secondary | ICD-10-CM

## 2012-08-12 DIAGNOSIS — E785 Hyperlipidemia, unspecified: Secondary | ICD-10-CM

## 2012-08-12 DIAGNOSIS — I10 Essential (primary) hypertension: Secondary | ICD-10-CM

## 2012-08-12 DIAGNOSIS — E875 Hyperkalemia: Secondary | ICD-10-CM

## 2012-08-12 LAB — CBC
HCT: 45 % (ref 39.0–52.0)
Hemoglobin: 15.9 g/dL (ref 13.0–17.0)
MCH: 32.4 pg (ref 26.0–34.0)
MCHC: 35.3 g/dL (ref 30.0–36.0)
MCV: 91.8 fL (ref 78.0–100.0)

## 2012-08-12 NOTE — Assessment & Plan Note (Signed)
Describes feeling fatigued after working out side for several hours. Encouraged adequate hydration and small, frequent meals

## 2012-08-12 NOTE — Assessment & Plan Note (Signed)
Repeat TSH

## 2012-08-12 NOTE — Assessment & Plan Note (Signed)
Mild in past. Recheck today, avoid trans fats.

## 2012-08-12 NOTE — Assessment & Plan Note (Signed)
Well controlled on current meds, no changes, check labs

## 2012-08-12 NOTE — Progress Notes (Signed)
Patient ID: Zachary Rowe, male   DOB: 22-Mar-1955, 57 y.o.   MRN: [id number] Zachary Rowe [id number] Jun 27, 1955 08/12/2012      Progress Note-Follow Up  Subjective  Chief Complaint  Chief Complaint  Patient presents with  . Follow-up    med refill    HPI  Patient is a 57 year old Caucasian male in today for followup. Patient is reporting generally feeling well but is complaining of some fatigability after working outside in hot weather for several hours. Chest pain or palpitations no shortness of breath. He denies recent, fevers, headaches, GI or GU complaints.  Past Medical History  Diagnosis Date  . Measles as a child  . Chicken pox as a child  . Chronic sinusitis 01-03-2000  . Hypertension   . Thyroid disease   . Hyperlipidemia   . Multiple allergies 09/02/2010  . History of chicken pox 09/02/2010  . History of measles 09/02/2010  . Overweight(278.02) 09/02/2010  . Elevated BP 09/02/2010  . HTN (hypertension) 09/02/2010  . 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 Fatigue 11/01/2010  . Raynaud disease 04/11/2011    Past Surgical History  Procedure Laterality Date  . Skin biopsy      BB in forehead    Family History  Problem Relation Age of Onset  . Hypertension Mother   . Diabetes Mother     borderline  . Neuropathy Mother   . Hypertension Father   . Diabetes Father     type 2  . Cancer Father 43    liver  . Diabetes Maternal Grandmother   . Emphysema Maternal Grandfather   . Stroke Paternal Grandfather     History   Social History  . Marital Status: Married    Spouse Name: N/A    Number of Children: N/A  . Years of Education: N/A   Occupational History  . Not on file.   Social History Main Topics  . Smoking status: Former Smoker    Types: Cigarettes    Quit date: 01/03/2007  . Smokeless tobacco: Current User    Types: Chew     Comment: dip  . Alcohol Use: Yes     Comment: 3-4 bottles of wine weekly  . Drug Use: No  . Sexually Active: Yes --  Male partner(s)   Other Topics Concern  . Not on file   Social History Narrative  . No narrative on file    Current Outpatient Prescriptions on File Prior to Visit  Medication Sig Dispense Refill  . aspirin 81 MG tablet Take 81 mg by mouth daily.        . Azelastine HCl (ASTEPRO) 0.15 % SOLN USE TWO SPRAY AS NEEDED  30 mL  0  . lisinopril (PRINIVIL,ZESTRIL) 20 MG tablet Take 1 tablet (20 mg total) by mouth daily.  90 tablet  1   No current facility-administered medications on file prior to visit.    No Known Allergies  Review of Systems  Review of Systems  Constitutional: Negative for fever and malaise/fatigue.  HENT: Negative for congestion.   Eyes: Negative for pain and discharge.  Respiratory: Negative for shortness of breath.   Cardiovascular: Negative for chest pain, palpitations and leg swelling.  Gastrointestinal: Negative for nausea, abdominal pain and diarrhea.  Genitourinary: Negative for dysuria.  Musculoskeletal: Negative for falls.  Skin: Negative for rash.  Neurological: Negative for loss of consciousness and headaches.  Endo/Heme/Allergies: Negative for polydipsia.  Psychiatric/Behavioral: Negative for depression and suicidal ideas. The patient is not  nervous/anxious and does not have insomnia.     Objective  BP 104/68  Pulse 65  Temp(Src) 98 F (36.7 C) (Oral)  Ht 6\' 3"  (1.905 m)  Wt 215 lb 0.6 oz (97.542 kg)  BMI 26.88 kg/m2  SpO2 97%  Physical Exam  Physical Exam  Constitutional: He is oriented to person, place, and time and well-developed, well-nourished, and in no distress. No distress.  HENT:  Head: Normocephalic and atraumatic.  Eyes: Conjunctivae are normal.  Neck: Neck supple. No thyromegaly present.  Cardiovascular: Normal rate, regular rhythm and normal heart sounds.   No murmur heard. Pulmonary/Chest: Effort normal and breath sounds normal. No respiratory distress.  Abdominal: He exhibits no distension and no mass. There is no  tenderness.  Musculoskeletal: He exhibits no edema.  Neurological: He is alert and oriented to person, place, and time.  Skin: Skin is warm.  Psychiatric: Memory, affect and judgment normal.    Lab Results  Component Value Date   TSH 5.01 04/11/2011   Lab Results  Component Value Date   WBC 4.9 04/11/2011   HGB 14.5 04/11/2011   HCT 43.3 04/11/2011   MCV 97.0 04/11/2011   PLT 251.0 04/11/2011   Lab Results  Component Value Date   CREATININE 1.3 04/11/2011   BUN 19 04/11/2011   NA 141 04/11/2011   K 4.4 04/11/2011   CL 105 04/11/2011   CO2 25 04/11/2011   Lab Results  Component Value Date   ALT 18 09/08/2010   AST 19 09/08/2010   ALKPHOS 41 09/08/2010   BILITOT 0.7 09/08/2010   Lab Results  Component Value Date   CHOL 208* 09/08/2010   Lab Results  Component Value Date   HDL 62.20 09/08/2010   No results found for this basename: LDLCALC   Lab Results  Component Value Date   TRIG 53.0 09/08/2010   Lab Results  Component Value Date   CHOLHDL 3 09/08/2010     Assessment & Plan  HTN (hypertension) Well controlled on current meds, no changes, check labs  Hyperlipidemia Mild in past. Recheck today, avoid trans fats.  Hyperkalemia Repeat renal panel today  Thyroid disease Repeat TSH  Fatigue Describes feeling fatigued after working out side for several hours. Encouraged adequate hydration and small, frequent meals

## 2012-08-12 NOTE — Assessment & Plan Note (Signed)
Repeat renal panel today 

## 2012-08-12 NOTE — Patient Instructions (Signed)
gatorade while working in hot weather  Hypertension As your heart beats, it forces blood through your arteries. This force is your blood pressure. If the pressure is too high, it is called hypertension (HTN) or high blood pressure. HTN is dangerous because you may have it and not know it. High blood pressure may mean that your heart has to work harder to pump blood. Your arteries may be narrow or stiff. The extra work puts you at risk for heart disease, stroke, and other problems.  Blood pressure consists of two numbers, a higher number over a lower, 110/72, for example. It is stated as "110 over 72." The ideal is below 120 for the top number (systolic) and under 80 for the bottom (diastolic). Write down your blood pressure today. You should pay close attention to your blood pressure if you have certain conditions such as:  Heart failure.  Prior heart attack.  Diabetes  Chronic kidney disease.  Prior stroke.  Multiple risk factors for heart disease. To see if you have HTN, your blood pressure should be measured while you are seated with your arm held at the level of the heart. It should be measured at least twice. A one-time elevated blood pressure reading (especially in the Emergency Department) does not mean that you need treatment. There may be conditions in which the blood pressure is different between your right and left arms. It is important to see your caregiver soon for a recheck. Most people have essential hypertension which means that there is not a specific cause. This type of high blood pressure may be lowered by changing lifestyle factors such as:  Stress.  Smoking.  Lack of exercise.  Excessive weight.  Drug/tobacco/alcohol use.  Eating less salt. Most people do not have symptoms from high blood pressure until it has caused damage to the body. Effective treatment can often prevent, delay or reduce that damage. TREATMENT  When a cause has been identified, treatment for  high blood pressure is directed at the cause. There are a large number of medications to treat HTN. These fall into several categories, and your caregiver will help you select the medicines that are best for you. Medications may have side effects. You should review side effects with your caregiver. If your blood pressure stays high after you have made lifestyle changes or started on medicines,   Your medication(s) may need to be changed.  Other problems may need to be addressed.  Be certain you understand your prescriptions, and know how and when to take your medicine.  Be sure to follow up with your caregiver within the time frame advised (usually within two weeks) to have your blood pressure rechecked and to review your medications.  If you are taking more than one medicine to lower your blood pressure, make sure you know how and at what times they should be taken. Taking two medicines at the same time can result in blood pressure that is too low. SEEK IMMEDIATE MEDICAL CARE IF:  You develop a severe headache, blurred or changing vision, or confusion.  You have unusual weakness or numbness, or a faint feeling.  You have severe chest or abdominal pain, vomiting, or breathing problems. MAKE SURE YOU:   Understand these instructions.  Will watch your condition.  Will get help right away if you are not doing well or get worse. Document Released: 12/19/2004 Document Revised: 03/13/2011 Document Reviewed: 08/09/2007 [facility] Patient Information 2014 [place], [place].

## 2012-08-13 LAB — HEPATIC FUNCTION PANEL
Alkaline Phosphatase: 45 U/L (ref 39–117)
Bilirubin, Direct: 0.1 mg/dL (ref 0.0–0.3)
Indirect Bilirubin: 0.6 mg/dL (ref 0.0–0.9)
Total Bilirubin: 0.7 mg/dL (ref 0.3–1.2)
Total Protein: 7.6 g/dL (ref 6.0–8.3)

## 2012-08-13 LAB — LIPID PANEL
LDL Cholesterol: 145 mg/dL — ABNORMAL HIGH (ref 0–99)
VLDL: 20 mg/dL (ref 0–40)

## 2012-08-13 LAB — HEMOGLOBIN A1C
Hgb A1c MFr Bld: 5.4 % (ref <5.7)
Mean Plasma Glucose: 108 mg/dL (ref <117)

## 2012-08-13 LAB — RENAL FUNCTION PANEL
BUN: 19 mg/dL (ref 6–23)
Chloride: 99 mEq/L (ref 96–112)
Phosphorus: 3.3 mg/dL (ref 2.3–4.6)
Potassium: 5.2 mEq/L (ref 3.5–5.3)

## 2012-10-11 ENCOUNTER — Encounter: Payer: Self-pay | Admitting: Family Medicine

## 2012-10-11 ENCOUNTER — Ambulatory Visit (INDEPENDENT_AMBULATORY_CARE_PROVIDER_SITE_OTHER): Payer: BC Managed Care – PPO | Admitting: Family Medicine

## 2012-10-11 VITALS — BP 130/80 | HR 56 | Temp 98.0°F | Ht 75.0 in | Wt 218.0 lb

## 2012-10-11 DIAGNOSIS — I10 Essential (primary) hypertension: Secondary | ICD-10-CM

## 2012-10-11 DIAGNOSIS — N4830 Priapism, unspecified: Secondary | ICD-10-CM

## 2012-10-11 DIAGNOSIS — N486 Induration penis plastica: Secondary | ICD-10-CM

## 2012-10-11 NOTE — Patient Instructions (Signed)
Has appt in 4 weeks needs lab appt added Stop Aspirin Consider Probiotic such as Digestive Advantage

## 2012-10-11 NOTE — Progress Notes (Signed)
Patient ID: Zachary Rowe, male   DOB: 03/23/1955, 57 y.o.   MRN: [id number] KYREESE CHIO [id number] [birth year] 10/11/2012      Progress Note-Follow Up  Subjective  Chief Complaint  Chief Complaint  Patient presents with  . hard spot on genitals    X 1 month- hurts with erections- sometimes going to the bathroom will make it sting    HPI  Patient is a Caucasian male who is complaining of some firmness at the base of the penile shaft and some painful erections over the last month. No dysuria or discharge no fevers or chills. No testicular symptoms. No abdominal pain or back pain. No new partner should. Denies chest pain, palpitations or shortness of breath  Past Medical History  Diagnosis Date  . Measles as a child  . Chicken pox as a child  . Chronic sinusitis 01-03-2000  . Hypertension   . Thyroid disease   . Hyperlipidemia   . Multiple allergies 09/02/2010  . History of chicken pox 09/02/2010  . History of measles 09/02/2010  . Overweight(278.02) 09/02/2010  . Elevated BP 09/02/2010  . HTN (hypertension) 09/02/2010  . 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 Fatigue 11/01/2010  . Raynaud disease 04/11/2011    Past Surgical History  Procedure Laterality Date  . Skin biopsy      BB in forehead    Family History  Problem Relation Age of Onset  . Hypertension Mother   . Diabetes Mother     borderline  . Neuropathy Mother   . Hypertension Father   . Diabetes Father     type 2  . Cancer Father 51    liver  . Diabetes Maternal Grandmother   . Emphysema Maternal Grandfather   . Stroke Paternal Grandfather     History   Social History  . Marital Status: Married    Spouse Name: N/A    Number of Children: N/A  . Years of Education: N/A   Occupational History  . Not on file.   Social History Main Topics  . Smoking status: Former Smoker    Types: Cigarettes    Quit date: 01/03/2007  . Smokeless tobacco: Current User    Types: Chew     Comment: dip  . Alcohol  Use: Yes     Comment: 3-4 bottles of wine weekly  . Drug Use: No  . Sexual Activity: Yes    Partners: Female   Other Topics Concern  . Not on file   Social History Narrative  . No narrative on file    Current Outpatient Prescriptions on File Prior to Visit  Medication Sig Dispense Refill  . aspirin 81 MG tablet Take 81 mg by mouth daily.        . Azelastine HCl (ASTEPRO) 0.15 % SOLN USE TWO SPRAY AS NEEDED  30 mL  0  . levothyroxine (SYNTHROID, LEVOTHROID) 25 MCG tablet Take 25 mcg by mouth daily before breakfast.      . lisinopril (PRINIVIL,ZESTRIL) 20 MG tablet Take 1 tablet (20 mg total) by mouth daily.  90 tablet  1   No current facility-administered medications on file prior to visit.    No Known Allergies  Review of Systems  Review of Systems  Constitutional: Negative for fever and malaise/fatigue.  HENT: Negative for congestion.   Eyes: Negative for discharge.  Respiratory: Negative for shortness of breath.   Cardiovascular: Negative for chest pain, palpitations and leg swelling.  Gastrointestinal: Negative for nausea, abdominal pain,  diarrhea, constipation, blood in stool and melena.  Genitourinary: Negative for dysuria, urgency, hematuria and flank pain.  Musculoskeletal: Negative for falls.  Skin: Negative for rash.  Neurological: Negative for loss of consciousness and headaches.  Endo/Heme/Allergies: Negative for polydipsia.  Psychiatric/Behavioral: Negative for depression and suicidal ideas. The patient is not nervous/anxious and does not have insomnia.     Objective  BP 130/80  Pulse 56  Temp(Src) 98 F (36.7 C) (Oral)  Ht 6\' 3"  (1.905 m)  Wt 218 lb (98.884 kg)  BMI 27.25 kg/m2  SpO2 98%  Physical Exam  Physical Exam  Constitutional: He is oriented to person, place, and time and well-developed, well-nourished, and in no distress. No distress.  HENT:  Head: Normocephalic and atraumatic.  Eyes: Conjunctivae are normal.  Neck: Neck supple. No  thyromegaly present.  Cardiovascular: Normal rate, regular rhythm and normal heart sounds.   No murmur heard. Pulmonary/Chest: Effort normal and breath sounds normal. No respiratory distress.  Abdominal: He exhibits no distension and no mass. There is no tenderness.  Genitourinary: No discharge found.  Firm nonmobile lesion at base of penis. No scrotal lesions  Musculoskeletal: He exhibits no edema.  Neurological: He is alert and oriented to person, place, and time.  Skin: Skin is warm.  Psychiatric: Memory, affect and judgment normal.    Lab Results  Component Value Date   TSH 3.075 08/12/2012   Lab Results  Component Value Date   WBC 5.7 08/12/2012   HGB 15.9 08/12/2012   HCT 45.0 08/12/2012   MCV 91.8 08/12/2012   PLT 320 08/12/2012   Lab Results  Component Value Date   CREATININE 1.23 08/12/2012   BUN 19 08/12/2012   NA 137 08/12/2012   K 5.2 08/12/2012   CL 99 08/12/2012   CO2 28 08/12/2012   Lab Results  Component Value Date   ALT 48 08/12/2012   AST 38* 08/12/2012   ALKPHOS 45 08/12/2012   BILITOT 0.7 08/12/2012   Lab Results  Component Value Date   CHOL 243* 08/12/2012   Lab Results  Component Value Date   HDL 78 08/12/2012   Lab Results  Component Value Date   LDLCALC 145* 08/12/2012   Lab Results  Component Value Date   TRIG 98 08/12/2012   Lab Results  Component Value Date   CHOLHDL 3.1 08/12/2012     Assessment & Plan  HTN (hypertension) Well controlled, no changes today  Peyronie disease New firmness at base of penis and painful erection. Is referred to urology for further consideration

## 2012-10-14 ENCOUNTER — Encounter: Payer: Self-pay | Admitting: Family Medicine

## 2012-10-14 DIAGNOSIS — N486 Induration penis plastica: Secondary | ICD-10-CM

## 2012-10-14 HISTORY — DX: Induration penis plastica: N48.6

## 2012-10-14 NOTE — Assessment & Plan Note (Signed)
New firmness at base of penis and painful erection. Is referred to urology for further consideration

## 2012-10-14 NOTE — Assessment & Plan Note (Signed)
Well controlled, no changes today 

## 2012-11-11 ENCOUNTER — Telehealth: Payer: Self-pay | Admitting: *Deleted

## 2012-11-11 DIAGNOSIS — Z79899 Other long term (current) drug therapy: Secondary | ICD-10-CM

## 2012-11-11 DIAGNOSIS — I10 Essential (primary) hypertension: Secondary | ICD-10-CM

## 2012-11-11 DIAGNOSIS — E785 Hyperlipidemia, unspecified: Secondary | ICD-10-CM

## 2012-11-11 DIAGNOSIS — E079 Disorder of thyroid, unspecified: Secondary | ICD-10-CM

## 2012-11-11 LAB — CBC
Hemoglobin: 15.6 g/dL (ref 13.0–17.0)
MCH: 32.7 pg (ref 26.0–34.0)
MCHC: 36 g/dL (ref 30.0–36.0)
MCV: 90.8 fL (ref 78.0–100.0)
Platelets: 298 10*3/uL (ref 150–400)
RBC: 4.77 MIL/uL (ref 4.22–5.81)

## 2012-11-11 NOTE — Telephone Encounter (Signed)
Lab orders placed, forwarded to [facility]/SLS 

## 2012-11-12 ENCOUNTER — Encounter: Payer: Self-pay | Admitting: Family Medicine

## 2012-11-12 ENCOUNTER — Ambulatory Visit (INDEPENDENT_AMBULATORY_CARE_PROVIDER_SITE_OTHER): Payer: BC Managed Care – PPO | Admitting: Family Medicine

## 2012-11-12 VITALS — BP 122/72 | HR 80 | Temp 98.0°F | Resp 16 | Ht 75.5 in | Wt 219.5 lb

## 2012-11-12 DIAGNOSIS — I10 Essential (primary) hypertension: Secondary | ICD-10-CM

## 2012-11-12 DIAGNOSIS — E079 Disorder of thyroid, unspecified: Secondary | ICD-10-CM

## 2012-11-12 DIAGNOSIS — Z0000 Encounter for general adult medical examination without abnormal findings: Secondary | ICD-10-CM

## 2012-11-12 DIAGNOSIS — E785 Hyperlipidemia, unspecified: Secondary | ICD-10-CM

## 2012-11-12 LAB — HEPATIC FUNCTION PANEL
ALT: 20 U/L (ref 0–53)
AST: 19 U/L (ref 0–37)
Albumin: 4.4 g/dL (ref 3.5–5.2)
Total Protein: 7.1 g/dL (ref 6.0–8.3)

## 2012-11-12 LAB — BASIC METABOLIC PANEL
BUN: 13 mg/dL (ref 6–23)
CO2: 28 mEq/L (ref 19–32)
Chloride: 102 mEq/L (ref 96–112)
Glucose, Bld: 99 mg/dL (ref 70–99)
Potassium: 4.8 mEq/L (ref 3.5–5.3)
Sodium: 138 mEq/L (ref 135–145)

## 2012-11-12 LAB — LIPID PANEL
Cholesterol: 231 mg/dL — ABNORMAL HIGH (ref 0–200)
VLDL: 23 mg/dL (ref 0–40)

## 2012-11-12 LAB — URINALYSIS, MICROSCOPIC ONLY
Bacteria, UA: NONE SEEN
Casts: NONE SEEN

## 2012-11-12 NOTE — Progress Notes (Signed)
Pre visit review using our clinic review tool, if applicable. No additional management support is needed unless otherwise documented below in the visit note/SLS  

## 2012-11-12 NOTE — Patient Instructions (Signed)

## 2012-11-12 NOTE — Progress Notes (Signed)
Patient ID: Zachary Rowe, male   DOB: [birth year], 57 y.o.   MRN: [id number] Zachary Rowe [id number] 02-Sep-1955 11/12/2012      Progress Note-Follow Up  Subjective  Chief Complaint  Chief Complaint  Patient presents with  . Annual Exam    Labs done prior.    HPI  Patient is a 57 yo male in today for annual exam. Doing well, no recent hospitalizations or acute illness. Has been seen by [facility] for Peyronie's and they have agreed to just monitor. Denies any headaches, chest pain, palpitations, shortness of breath, GI or GU concerns. He's taking medications as prescribed and without difficulty.  Past Medical History  Diagnosis Date  . Measles as a child  . Chicken pox as a child  . Chronic sinusitis 01-03-2000  . Hypertension   . Thyroid disease   . Hyperlipidemia   . Multiple allergies 09/02/2010  . History of chicken pox 09/02/2010  . History of measles 09/02/2010  . Overweight(278.02) 09/02/2010  . Elevated BP 09/02/2010  . HTN (hypertension) 09/02/2010  . 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 Fatigue 11/01/2010  . Raynaud disease 04/11/2011  . Peyronie disease 10/14/2012    Past Surgical History  Procedure Laterality Date  . Skin biopsy      BB in forehead    Family History  Problem Relation Age of Onset  . Hypertension Mother   . Diabetes Mother     borderline  . Neuropathy Mother   . Hypertension Father   . Diabetes Father     type 2  . Cancer Father 20    liver  . Diabetes Maternal Grandmother   . Emphysema Maternal Grandfather   . Stroke Paternal Grandfather     History   Social History  . Marital Status: Married    Spouse Name: N/A    Number of Children: N/A  . Years of Education: N/A   Occupational History  . Not on file.   Social History Main Topics  . Smoking status: Former Smoker    Types: Cigarettes    Quit date: 01/03/2007  . Smokeless tobacco: Current User    Types: Chew     Comment: dip  . Alcohol Use: Yes     Comment:  3-4 bottles of wine weekly  . Drug Use: No  . Sexual Activity: Yes    Partners: Female   Other Topics Concern  . Not on file   Social History Narrative  . No narrative on file    Current Outpatient Prescriptions on File Prior to Visit  Medication Sig Dispense Refill  . aspirin 81 MG tablet Take 81 mg by mouth daily.        . Azelastine HCl (ASTEPRO) 0.15 % SOLN USE TWO SPRAY AS NEEDED  30 mL  0  . levothyroxine (SYNTHROID, LEVOTHROID) 25 MCG tablet Take 25 mcg by mouth daily before breakfast.      . lisinopril (PRINIVIL,ZESTRIL) 20 MG tablet Take 1 tablet (20 mg total) by mouth daily.  90 tablet  1   No current facility-administered medications on file prior to visit.    No Known Allergies  Review of Systems  Review of Systems  Constitutional: Negative for fever, chills and malaise/fatigue.  HENT: Negative for congestion, hearing loss and nosebleeds.   Eyes: Negative for discharge.  Respiratory: Negative for cough, sputum production, shortness of breath and wheezing.   Cardiovascular: Negative for chest pain, palpitations and leg swelling.  Gastrointestinal: Negative for heartburn, nausea, vomiting,  abdominal pain, diarrhea, constipation and blood in stool.  Genitourinary: Negative for dysuria, urgency, frequency and hematuria.  Musculoskeletal: Negative for back pain, falls and myalgias.  Skin: Negative for rash.  Neurological: Negative for dizziness, tremors, sensory change, focal weakness, loss of consciousness, weakness and headaches.  Endo/Heme/Allergies: Negative for polydipsia. Does not bruise/bleed easily.  Psychiatric/Behavioral: Negative for depression and suicidal ideas. The patient is not nervous/anxious and does not have insomnia.     Objective  BP 122/72  Pulse 80  Temp(Src) 98 F (36.7 C) (Oral)  Resp 16  Ht 6' 3.5" (1.918 m)  Wt 219 lb 8 oz (99.565 kg)  BMI 27.07 kg/m2  SpO2 98%  Physical Exam  Physical Exam  Constitutional: He is oriented to  person, place, and time and well-developed, well-nourished, and in no distress. No distress.  HENT:  Head: Normocephalic and atraumatic.  Eyes: Conjunctivae are normal.  Neck: Neck supple. No thyromegaly present.  Cardiovascular: Normal rate, regular rhythm and normal heart sounds.   No murmur heard. Pulmonary/Chest: Effort normal and breath sounds normal. No respiratory distress.  Abdominal: He exhibits no distension and no mass. There is no tenderness.  Musculoskeletal: He exhibits no edema.  Neurological: He is alert and oriented to person, place, and time.  Skin: Skin is warm.  Psychiatric: Memory, affect and judgment normal.    Lab Results  Component Value Date   TSH 4.281 11/11/2012   Lab Results  Component Value Date   WBC 4.7 11/11/2012   HGB 15.6 11/11/2012   HCT 43.3 11/11/2012   MCV 90.8 11/11/2012   PLT 298 11/11/2012   Lab Results  Component Value Date   CREATININE 1.22 11/11/2012   BUN 13 11/11/2012   NA 138 11/11/2012   K 4.8 11/11/2012   CL 102 11/11/2012   CO2 28 11/11/2012   Lab Results  Component Value Date   ALT 20 11/11/2012   AST 19 11/11/2012   ALKPHOS 42 11/11/2012   BILITOT 0.9 11/11/2012   Lab Results  Component Value Date   CHOL 231* 11/11/2012   Lab Results  Component Value Date   HDL 64 11/11/2012   Lab Results  Component Value Date   LDLCALC 144* 11/11/2012   Lab Results  Component Value Date   TRIG 113 11/11/2012   Lab Results  Component Value Date   CHOLHDL 3.6 11/11/2012     Assessment & Plan HTN (hypertension) Well controlled, no changes  Thyroid disease Stable on current dose of Levothyroxine no changes  Hyperlipidemia Avoid trans fats, increase exercise. Start krill oil caps  Preventative health care Doing well, encouraged heart heatlhy diet, regular exercise and adequate sleep. Reviewed annual labs. Has first colonoscopy in 2009 repeat in 2019.

## 2012-11-17 ENCOUNTER — Encounter: Payer: Self-pay | Admitting: Family Medicine

## 2012-11-17 DIAGNOSIS — Z0000 Encounter for general adult medical examination without abnormal findings: Secondary | ICD-10-CM

## 2012-11-17 HISTORY — DX: Encounter for general adult medical examination without abnormal findings: Z00.00

## 2012-11-17 NOTE — Assessment & Plan Note (Signed)
Doing well, encouraged heart heatlhy diet, regular exercise and adequate sleep. Reviewed annual labs. Has first colonoscopy in 2009 repeat in 2019.

## 2012-11-17 NOTE — Assessment & Plan Note (Signed)
Stable on current dose of Levothyroxine no changes

## 2012-11-17 NOTE — Assessment & Plan Note (Signed)
Well controlled, no changes 

## 2012-11-17 NOTE — Assessment & Plan Note (Signed)
Avoid trans fats, increase exercise. Start krill oil caps

## 2013-01-13 ENCOUNTER — Telehealth: Payer: Self-pay | Admitting: Family Medicine

## 2013-01-13 NOTE — Telephone Encounter (Signed)
Patient is requesting a referral to [name] for a colonscopy. He states that the last colonscopy that he had was in 2009 and was recommended to follow up in 3 years. Gastroenterologist that he saw was in [place], Dr. Tiana Loft.

## 2013-01-14 ENCOUNTER — Other Ambulatory Visit: Payer: Self-pay | Admitting: Family Medicine

## 2013-01-14 NOTE — Telephone Encounter (Signed)
Please advise 

## 2013-01-14 NOTE — Telephone Encounter (Signed)
I just need to know why they wanted to repeat it so soon so I can put that in the referral. Did he have polyps? Etc? FH?

## 2013-01-15 NOTE — Telephone Encounter (Signed)
Spoke with pt, he states he had polyps on his previous colonoscopy and they advised him to repeat it in 3 years.  He is not sure what type of polyps they were.

## 2013-01-16 ENCOUNTER — Other Ambulatory Visit: Payer: Self-pay | Admitting: Family Medicine

## 2013-01-16 DIAGNOSIS — Z86010 Personal history of colonic polyps: Secondary | ICD-10-CM

## 2013-01-16 NOTE — Telephone Encounter (Signed)
So I put in referral but the gastroenterologist is going to want the report from the last one if possible, see if he can come in with contact info from last GI so he can sign of release of records for the colonoscopy

## 2013-01-17 NOTE — Telephone Encounter (Signed)
Returned my call

## 2013-01-17 NOTE — Telephone Encounter (Signed)
Left a message for pt to return my call. 

## 2013-01-20 NOTE — Telephone Encounter (Signed)
Patient informed and states it should be in MD's records.  I looked and found a copy and put it on Jennifer's desk

## 2013-01-21 ENCOUNTER — Encounter: Payer: Self-pay | Admitting: Internal Medicine

## 2013-02-04 ENCOUNTER — Encounter: Payer: Self-pay | Admitting: Family Medicine

## 2013-02-20 ENCOUNTER — Encounter: Payer: Self-pay | Admitting: Internal Medicine

## 2013-02-20 ENCOUNTER — Ambulatory Visit (AMBULATORY_SURGERY_CENTER): Payer: Self-pay | Admitting: *Deleted

## 2013-02-20 VITALS — Ht 75.0 in | Wt 224.2 lb

## 2013-02-20 DIAGNOSIS — Z86010 Personal history of colonic polyps: Secondary | ICD-10-CM

## 2013-02-20 MED ORDER — NA SULFATE-K SULFATE-MG SULF 17.5-3.13-1.6 GM/177ML PO SOLN: 1.0000 | Freq: Once | ORAL | Status: DC

## 2013-02-20 NOTE — Progress Notes (Signed)
Denies allergies to eggs or soy products. Denies complications with sedation or anesthesia. 

## 2013-02-27 ENCOUNTER — Encounter: Payer: Self-pay | Admitting: Internal Medicine

## 2013-03-06 ENCOUNTER — Encounter: Payer: Self-pay | Admitting: Internal Medicine

## 2013-03-06 ENCOUNTER — Ambulatory Visit (AMBULATORY_SURGERY_CENTER): Payer: BC Managed Care – PPO | Admitting: Internal Medicine

## 2013-03-06 VITALS — BP 131/75 | HR 48 | Temp 97.4°F | Resp 25 | Ht 75.0 in | Wt 224.0 lb

## 2013-03-06 DIAGNOSIS — D129 Benign neoplasm of anus and anal canal: Secondary | ICD-10-CM

## 2013-03-06 DIAGNOSIS — Z86010 Personal history of colonic polyps: Secondary | ICD-10-CM

## 2013-03-06 DIAGNOSIS — D126 Benign neoplasm of colon, unspecified: Secondary | ICD-10-CM

## 2013-03-06 DIAGNOSIS — D128 Benign neoplasm of rectum: Secondary | ICD-10-CM

## 2013-03-06 HISTORY — PX: COLONOSCOPY: SHX174

## 2013-03-06 MED ORDER — SODIUM CHLORIDE 0.9 % IV SOLN: 500.0000 mL | INTRAVENOUS | Status: DC

## 2013-03-06 NOTE — Op Note (Signed)
[place]  [address] [place], [id number]   COLONOSCOPY PROCEDURE REPORT  PATIENT: Zachary Rowe, Zachary Rowe  MR#: [id number] BIRTHDATE: June 21, 1955 , 55  yrs. old GENDER: Male ENDOSCOPIST: Gatha Mayer, MD, [facility] REFERRED CV:ELFYB Charlett Blake, M.D. PROCEDURE DATE:  03/06/2013 PROCEDURE:   Colonoscopy with snare polypectomy First Screening Colonoscopy - Avg.  risk and is 50 yrs.  old or older - No.  Prior Negative Screening - Now for repeat screening. N/A  History of Adenoma - Now for follow-up colonoscopy & has been > or = to 3 yrs.  Yes hx of adenoma.  Has been 3 or more years since last colonoscopy.  Polyps Removed Today? Yes. ASA CLASS:   Class II INDICATIONS:Patient's personal history of adenomatous colon polyps and Last colonoscopy performed 5 years ago. MEDICATIONS: propofol (Diprivan) 400mg  IV, MAC sedation, administered by CRNA, and These medications were titrated to patient response per physician's verbal order  DESCRIPTION OF PROCEDURE:   After the risks benefits and alternatives of the procedure were thoroughly explained, informed consent was obtained.  A digital rectal exam revealed no abnormalities of the rectum, A digital rectal exam revealed no prostatic nodules, and A digital rectal exam revealed the prostate was not enlarged.   The LB CF-[id number] Loaner [id number]  endoscope was introduced through the anus and advanced to the cecum, which was identified by both the appendix and ileocecal valve. No adverse events experienced.   The quality of the prep was Suprep adequate The instrument was then slowly withdrawn as the colon was fully examined.  COLON FINDINGS: Four diminutive sessile polyps were found at the cecum, in the ascending colon, transverse colon, and rectum.  A polypectomy was performed with a cold snare.  The resection was complete and the polyp tissue was completely retrieved.   The colon mucosa was otherwise normal.   A right colon retroflexion  was performed.  Retroflexed views revealed no abnormalities. The time to cecum=3 minutes 35 seconds.  Withdrawal time=17 minutes 30 seconds.  The scope was withdrawn and the procedure completed. COMPLICATIONS: There were no complications.  ENDOSCOPIC IMPRESSION: 1.   Four diminutive sessile polyps were found at the cecum, in the ascending colon, transverse colon, and rectum; polypectomy was performed with a cold snare 2.   The colon mucosa was otherwise normal - adequate prep  RECOMMENDATIONS: Timing of repeat colonoscopy will be determined by pathology findings in a patient with hx adenoma 2009   eSigned:  Gatha Mayer, MD, [facility] 03/06/2013 10:42 AM   cc: The Patient and Willette Alma, MD

## 2013-03-06 NOTE — Progress Notes (Signed)
A/ox3 pleased with MAC, report to Suzanne RN 

## 2013-03-06 NOTE — Progress Notes (Signed)
Called to room to assist during endoscopic procedure.  Patient ID and intended procedure confirmed with present staff. Received instructions for my participation in the procedure from the performing physician.  

## 2013-03-06 NOTE — Patient Instructions (Addendum)
I found and removed 4 tiny polyps that look benign.  I will let you know pathology results and when to have another routine colonoscopy by mail.  I appreciate the opportunity to care for you. Gatha Mayer, MD, FACG YOU HAD AN ENDOSCOPIC PROCEDURE TODAY AT [facility]: Refer to the procedure report that was given to you for any specific questions about what was found during the examination.  If the procedure report does not answer your questions, please call your gastroenterologist to clarify.  If you requested that your care partner not be given the details of your procedure findings, then the procedure report has been included in a sealed envelope for you to review at your convenience later.  YOU SHOULD EXPECT: Some feelings of bloating in the abdomen. Passage of more gas than usual.  Walking can help get rid of the air that was put into your GI tract during the procedure and reduce the bloating. If you had a lower endoscopy (such as a colonoscopy or flexible sigmoidoscopy) you may notice spotting of blood in your stool or on the toilet paper. If you underwent a bowel prep for your procedure, then you may not have a normal bowel movement for a few days.  DIET: Your first meal following the procedure should be a light meal and then it is ok to progress to your normal diet.  A half-sandwich or bowl of soup is an example of a good first meal.  Heavy or fried foods are harder to digest and may make you feel nauseous or bloated.  Likewise meals heavy in dairy and vegetables can cause extra gas to form and this can also increase the bloating.  Drink plenty of fluids but you should avoid alcoholic beverages for 24 hours.  ACTIVITY: Your care partner should take you home directly after the procedure.  You should plan to take it easy, moving slowly for the rest of the day.  You can resume normal activity the day after the procedure however you should NOT DRIVE or use heavy machinery for 24  hours (because of the sedation medicines used during the test).    SYMPTOMS TO REPORT IMMEDIATELY: A gastroenterologist can be reached at any hour.  During normal business hours, 8:30 AM to 5:00 PM Monday through Friday, call [phone].  After hours and on weekends, please call the GI answering service at [phone] who will take a message and have the physician on call contact you.   Following lower endoscopy (colonoscopy or flexible sigmoidoscopy):  Excessive amounts of blood in the stool  Significant tenderness or worsening of abdominal pains  Swelling of the abdomen that is new, acute  Fever of 100F or higher  FOLLOW UP: If any biopsies were taken you will be contacted by phone or by letter within the next 1-3 weeks.  Call your gastroenterologist if you have not heard about the biopsies in 3 weeks.  Our staff will call the home number listed on your records the next business day following your procedure to check on you and address any questions or concerns that you may have at that time regarding the information given to you following your procedure. This is a courtesy call and so if there is no answer at the home number and we have not heard from you through the emergency physician on call, we will assume that you have returned to your regular daily activities without incident.  SIGNATURES/CONFIDENTIALITY: You and/or your care partner have signed  paperwork which will be entered into your electronic medical record.  These signatures attest to the fact that that the information above on your After Visit Summary has been reviewed and is understood.  Full responsibility of the confidentiality of this discharge information lies with you and/or your care-partner.

## 2013-03-07 ENCOUNTER — Telehealth: Payer: Self-pay | Admitting: *Deleted

## 2013-03-07 NOTE — Telephone Encounter (Signed)
No answer, left message to call if questions or concerns. 

## 2013-03-12 ENCOUNTER — Encounter: Payer: Self-pay | Admitting: Internal Medicine

## 2013-03-12 NOTE — Progress Notes (Signed)
Quick Note:  Diminutive adenomas Repeat colonoscopy 5 years ______

## 2013-06-25 ENCOUNTER — Other Ambulatory Visit: Payer: Self-pay | Admitting: Family Medicine

## 2013-06-30 ENCOUNTER — Encounter: Payer: Self-pay | Admitting: Family Medicine

## 2013-06-30 MED ORDER — AZELASTINE HCL 0.15 % NA SOLN: NASAL | Status: DC

## 2013-07-21 ENCOUNTER — Encounter: Payer: Self-pay | Admitting: Family Medicine

## 2013-07-21 ENCOUNTER — Telehealth: Payer: Self-pay | Admitting: Family Medicine

## 2013-07-21 MED ORDER — LEVOTHYROXINE SODIUM 25 MCG PO TABS: ORAL_TABLET | ORAL | Status: DC

## 2013-07-21 MED ORDER — LISINOPRIL 20 MG PO TABS: ORAL_TABLET | ORAL | Status: DC

## 2013-07-21 NOTE — Telephone Encounter (Signed)
Refills granted.  Patient overdue for follow-up and labs with Dr. Charlett Blake. No additional refills without appointment.

## 2013-07-21 NOTE — Telephone Encounter (Signed)
Refill- lisinopril  Refill- synthroid  [facility]

## 2013-07-22 MED ORDER — LEVOTHYROXINE SODIUM 25 MCG PO TABS: ORAL_TABLET | ORAL | Status: DC

## 2013-07-22 MED ORDER — LISINOPRIL 20 MG PO TABS: ORAL_TABLET | ORAL | Status: DC

## 2013-07-22 NOTE — Addendum Note (Signed)
Addended by: Varney Daily on: 07/22/2013 07:58 AM   Modules accepted: Orders

## 2013-07-25 ENCOUNTER — Encounter: Payer: Self-pay | Admitting: Physician Assistant

## 2013-07-25 ENCOUNTER — Ambulatory Visit (INDEPENDENT_AMBULATORY_CARE_PROVIDER_SITE_OTHER): Payer: BC Managed Care – PPO | Admitting: Physician Assistant

## 2013-07-25 VITALS — BP 148/92 | HR 42 | Temp 97.9°F | Resp 16 | Ht 75.0 in | Wt 215.5 lb

## 2013-07-25 DIAGNOSIS — R202 Paresthesia of skin: Secondary | ICD-10-CM

## 2013-07-25 DIAGNOSIS — R209 Unspecified disturbances of skin sensation: Secondary | ICD-10-CM

## 2013-07-25 LAB — COMPREHENSIVE METABOLIC PANEL
ALBUMIN: 4.5 g/dL (ref 3.5–5.2)
ALT: 22 U/L (ref 0–53)
AST: 23 U/L (ref 0–37)
Alkaline Phosphatase: 39 U/L (ref 39–117)
BUN: 14 mg/dL (ref 6–23)
CHLORIDE: 101 meq/L (ref 96–112)
CO2: 28 mEq/L (ref 19–32)
Calcium: 9 mg/dL (ref 8.4–10.5)
Creat: 1.09 mg/dL (ref 0.50–1.35)
Glucose, Bld: 86 mg/dL (ref 70–99)
Potassium: 4.9 mEq/L (ref 3.5–5.3)
Sodium: 137 mEq/L (ref 135–145)
Total Bilirubin: 0.7 mg/dL (ref 0.2–1.2)
Total Protein: 6.9 g/dL (ref 6.0–8.3)

## 2013-07-25 LAB — TSH: TSH: 5.138 u[IU]/mL — ABNORMAL HIGH (ref 0.350–4.500)

## 2013-07-25 LAB — HEMOGLOBIN A1C
Hgb A1c MFr Bld: 5.6 % (ref <5.7)
Mean Plasma Glucose: 114 mg/dL (ref <117)

## 2013-07-25 LAB — VITAMIN B12: Vitamin B-12: 262 pg/mL (ref 211–911)

## 2013-07-25 NOTE — Patient Instructions (Signed)
Please obtain labs.  I will call you with your results.  We will set up further workup and treatment based on lab results.

## 2013-07-25 NOTE — Assessment & Plan Note (Signed)
Unclear etiology. Will check B12, TSH, BMP and A1C.  If workup unremarkable, may need referral to neurology for further evaluation. May attempt trial of gabapentin once results are in.

## 2013-07-25 NOTE — Progress Notes (Signed)
Pre visit review using our clinic review tool, if applicable. No additional management support is needed unless otherwise documented below in the visit note/SLS  

## 2013-07-25 NOTE — Progress Notes (Signed)
Patient presents to clinic today c/o burning and "weird" sensation of plantar surface of feet bilaterally that has been present for about a month. Patient denies trauma or injury.  Is a non-smoker.  Denies history of B12 deficiency.  Has history of hypothyroidism, currently on Levothyroxine 25 mcg.  Denies history of diabetes.  Last A1C 1 year ago in the normal range.  Patient does have history of Raynaud's affecting bilateral feet in winter months.  Denies skin discoloration, coolness or decreased ROM.  Denies hx of nerve damage to lower extremities.  Denies altered sensation elsewhere.  Past Medical History  Diagnosis Date  . Measles as a child  . Chicken pox as a child  . Chronic sinusitis 01-03-2000  . Hypertension   . Thyroid disease   . Hyperlipidemia   . Multiple allergies 09/02/2010  . History of chicken pox 09/02/2010  . History of measles 09/02/2010  . Overweight(278.02) 09/02/2010  . Elevated BP 09/02/2010  . HTN (hypertension) 09/02/2010  . 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 Fatigue 11/01/2010  . Raynaud disease 04/11/2011  . Peyronie disease 10/14/2012  . Preventative health care 11/17/2012  . Hard of hearing     wears bilateral hearing aids  . Personal history of colonic polyps - adenoma 04/17/2007    04/2007 - 3 sigmoid polyps - at least 1 tubular adenoma    Current Outpatient Prescriptions on File Prior to Visit  Medication Sig Dispense Refill  . Azelastine HCl (ASTEPRO) 0.15 % SOLN USE TWO SPRAY AS NEEDED  30 mL  1  . folic acid (FOLVITE) 854 MCG tablet Take 400 mcg by mouth daily as needed.       Marland Kitchen levothyroxine (SYNTHROID) 25 MCG tablet TAKE 1 TABLET DAILY  90 tablet  0  . lisinopril (PRINIVIL,ZESTRIL) 20 MG tablet TAKE 1 TABLET DAILY  90 tablet  0  . Thiamine HCl (VITAMIN B-1) 250 MG tablet Take 250 mg by mouth daily as needed.        No current facility-administered medications on file prior to visit.    No Known Allergies  Family History  Problem Relation Age  of Onset  . Hypertension Mother   . Diabetes Mother     borderline  . Neuropathy Mother   . Macular degeneration Mother   . COPD Mother     recent pneumonia  . Hypertension Father   . Diabetes Father     type 2  . Cancer Father 53    liver  . Diabetes Maternal Grandmother   . Parkinson's disease Maternal Grandmother   . Emphysema Maternal Grandfather   . COPD Maternal Grandfather   . Stroke Paternal Grandfather   . Cancer Maternal Uncle     prostate cancer  . Colon cancer Neg Hx   . Esophageal cancer Neg Hx   . Rectal cancer Neg Hx   . Stomach cancer Neg Hx     History   Social History  . Marital Status: Married    Spouse Name: N/A    Number of Children: N/A  . Years of Education: N/A   Social History Main Topics  . Smoking status: Former Smoker    Types: Cigarettes    Quit date: 01/03/2007  . Smokeless tobacco: Current User    Types: Chew     Comment: dip  . Alcohol Use: 9.0 - 12.0 oz/week    15-20 Glasses of wine per week     Comment: occas drinks Scotch  . Drug Use: No  .  Sexual Activity: Yes    Partners: Female   Other Topics Concern  . None   Social History Narrative  . None   Review of Systems - See HPI.  All other ROS are negative.  BP 148/92  Pulse 42  Temp(Src) 97.9 F (36.6 C) (Oral)  Resp 16  Ht 6\' 3"  (1.905 m)  Wt 215 lb 8 oz (97.75 kg)  BMI 26.94 kg/m2  SpO2 98%  Physical Exam  Vitals reviewed. Constitutional: He is oriented to person, place, and time and well-developed, well-nourished, and in no distress.  HENT:  Head: Normocephalic and atraumatic.  Eyes: Conjunctivae are normal.  Neck: Neck supple. No thyromegaly present.  Cardiovascular: Normal rate, regular rhythm, normal heart sounds and intact distal pulses.   Pulmonary/Chest: Effort normal and breath sounds normal. No respiratory distress. He has no wheezes. He has no rales. He exhibits no tenderness.  Neurological: He is alert and oriented to person, place, and time. He  has normal strength and intact cranial nerves. No cranial nerve deficit. Gait normal.  Sensation of dorsal and plantar surfaces of feet intact bilaterally.  2-point discrimination and proprioception intact.  Sharp/sull sensation intact.  Monofilament testing within normal limits. Patient states soft sensation feels different on left foot from right foot.   Skin: Skin is warm and dry. No rash noted.  Psychiatric: Affect normal.   Assessment/Plan: Abnormal sensation of lower extremity -- bilateral feet Unclear etiology. Will check B12, TSH, BMP and A1C.  If workup unremarkable, may need referral to neurology for further evaluation. May attempt trial of gabapentin once results are in.

## 2013-07-27 ENCOUNTER — Telehealth: Payer: Self-pay | Admitting: Physician Assistant

## 2013-07-27 DIAGNOSIS — R7989 Other specified abnormal findings of blood chemistry: Secondary | ICD-10-CM

## 2013-07-27 NOTE — Telephone Encounter (Signed)
Labs unremarkable for cause of symptoms.  TSH is mildly elevated indicating that dose of levothyroxine may be insufficient.  Because this level is just slightly elevated, I recommend we repeat it in 3 weeks.  If still elevated will increase levothyroxine dose.  Order for TSH placed.  Patient expected in 3 weeks.  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

## 2013-07-28 NOTE — Telephone Encounter (Signed)
Letter mailed to patient and mychart message sent

## 2013-08-20 ENCOUNTER — Telehealth: Payer: Self-pay | Admitting: Physician Assistant

## 2013-08-20 DIAGNOSIS — E039 Hypothyroidism, unspecified: Secondary | ICD-10-CM

## 2013-08-20 LAB — TSH: TSH: 6.506 u[IU]/mL — ABNORMAL HIGH (ref 0.350–4.500)

## 2013-08-20 MED ORDER — LEVOTHYROXINE SODIUM 50 MCG PO TABS: 50.0000 ug | ORAL_TABLET | Freq: Every day | ORAL | Status: DC

## 2013-08-20 NOTE — Telephone Encounter (Signed)
TSH is still elevated, indicated current dose of Synthroid is no sufficient.  I have increased dosage to 50 mcg daily.  He can take two of the 25 mcg tablets until he picks up new prescription.  We will need to check his TSH again in 6 weeks to make sure we are on the right track.  This may take a couple of dose changes over the next couple of months until we get to a therapeutic dose.

## 2013-08-20 NOTE — Telephone Encounter (Signed)
Patient informed, understood & agreed; will call lab to get appt [[facility]] after the merger; future lab order placed/SLS

## 2013-08-22 ENCOUNTER — Encounter: Payer: Self-pay | Admitting: Family Medicine

## 2013-09-04 ENCOUNTER — Encounter: Payer: Self-pay | Admitting: Family Medicine

## 2013-09-04 DIAGNOSIS — E079 Disorder of thyroid, unspecified: Secondary | ICD-10-CM

## 2013-09-05 ENCOUNTER — Other Ambulatory Visit: Payer: Self-pay | Admitting: Physician Assistant

## 2013-09-05 DIAGNOSIS — E039 Hypothyroidism, unspecified: Secondary | ICD-10-CM

## 2013-09-05 MED ORDER — LEVOTHYROXINE SODIUM 50 MCG PO TABS: 50.0000 ug | ORAL_TABLET | Freq: Every day | ORAL | Status: DC

## 2013-09-05 NOTE — Addendum Note (Signed)
Addended by: Varney Daily on: 09/05/2013 08:16 AM   Modules accepted: Orders

## 2013-09-25 ENCOUNTER — Other Ambulatory Visit: Payer: Self-pay | Admitting: Physician Assistant

## 2013-09-25 DIAGNOSIS — E039 Hypothyroidism, unspecified: Secondary | ICD-10-CM

## 2013-09-25 NOTE — Telephone Encounter (Signed)
Inform patient we will send in 1 month of Levothyroxine but he will need an appt for additional refills  thanks

## 2013-09-29 ENCOUNTER — Encounter: Payer: Self-pay | Admitting: Family Medicine

## 2013-09-29 DIAGNOSIS — R202 Paresthesia of skin: Secondary | ICD-10-CM

## 2013-09-29 DIAGNOSIS — Z0000 Encounter for general adult medical examination without abnormal findings: Secondary | ICD-10-CM

## 2013-09-29 DIAGNOSIS — E079 Disorder of thyroid, unspecified: Secondary | ICD-10-CM

## 2013-09-29 DIAGNOSIS — W57XXXA Bitten or stung by nonvenomous insect and other nonvenomous arthropods, initial encounter: Secondary | ICD-10-CM

## 2013-09-29 DIAGNOSIS — R51 Headache: Secondary | ICD-10-CM

## 2013-09-29 DIAGNOSIS — A770 Spotted fever due to Rickettsia rickettsii: Secondary | ICD-10-CM

## 2013-09-29 DIAGNOSIS — M791 Myalgia, unspecified site: Secondary | ICD-10-CM

## 2013-09-29 DIAGNOSIS — E785 Hyperlipidemia, unspecified: Secondary | ICD-10-CM

## 2013-09-29 DIAGNOSIS — I10 Essential (primary) hypertension: Secondary | ICD-10-CM

## 2013-11-11 ENCOUNTER — Other Ambulatory Visit (INDEPENDENT_AMBULATORY_CARE_PROVIDER_SITE_OTHER): Payer: BC Managed Care – PPO

## 2013-11-11 DIAGNOSIS — R519 Headache, unspecified: Secondary | ICD-10-CM

## 2013-11-11 DIAGNOSIS — Z0000 Encounter for general adult medical examination without abnormal findings: Secondary | ICD-10-CM

## 2013-11-11 DIAGNOSIS — R202 Paresthesia of skin: Secondary | ICD-10-CM

## 2013-11-11 DIAGNOSIS — I10 Essential (primary) hypertension: Secondary | ICD-10-CM

## 2013-11-11 DIAGNOSIS — W57XXXA Bitten or stung by nonvenomous insect and other nonvenomous arthropods, initial encounter: Secondary | ICD-10-CM

## 2013-11-11 DIAGNOSIS — E785 Hyperlipidemia, unspecified: Secondary | ICD-10-CM

## 2013-11-11 DIAGNOSIS — M791 Myalgia, unspecified site: Secondary | ICD-10-CM

## 2013-11-11 DIAGNOSIS — R51 Headache: Secondary | ICD-10-CM

## 2013-11-11 DIAGNOSIS — E875 Hyperkalemia: Secondary | ICD-10-CM

## 2013-11-11 DIAGNOSIS — E079 Disorder of thyroid, unspecified: Secondary | ICD-10-CM

## 2013-11-11 DIAGNOSIS — A770 Spotted fever due to Rickettsia rickettsii: Secondary | ICD-10-CM

## 2013-11-11 LAB — PSA: PSA: 0.31 ng/mL (ref 0.10–4.00)

## 2013-11-11 LAB — RENAL FUNCTION PANEL
Albumin: 3.8 g/dL (ref 3.5–5.2)
BUN: 12 mg/dL (ref 6–23)
CHLORIDE: 101 meq/L (ref 96–112)
CO2: 28 mEq/L (ref 19–32)
Calcium: 9.3 mg/dL (ref 8.4–10.5)
Creatinine, Ser: 1.1 mg/dL (ref 0.4–1.5)
GFR: 76.85 mL/min (ref >60.00)
Glucose, Bld: 101 mg/dL — ABNORMAL HIGH (ref 70–99)
Phosphorus: 3.2 mg/dL (ref 2.3–4.6)
Potassium: 5.3 mEq/L — ABNORMAL HIGH (ref 3.5–5.1)
Sodium: 138 mEq/L (ref 135–145)

## 2013-11-11 LAB — HEPATIC FUNCTION PANEL
ALBUMIN: 3.8 g/dL (ref 3.5–5.2)
ALT: 42 U/L (ref 0–53)
AST: 31 U/L (ref 0–37)
Alkaline Phosphatase: 45 U/L (ref 39–117)
Bilirubin, Direct: 0.1 mg/dL (ref 0.0–0.3)
Total Bilirubin: 0.7 mg/dL (ref 0.2–1.2)
Total Protein: 7.1 g/dL (ref 6.0–8.3)

## 2013-11-11 LAB — LIPID PANEL
Cholesterol: 254 mg/dL — ABNORMAL HIGH (ref 0–200)
HDL: 57.2 mg/dL (ref >39.00)
LDL Cholesterol: 177 mg/dL — ABNORMAL HIGH (ref 0–99)
NonHDL: 196.8
TRIGLYCERIDES: 97 mg/dL (ref 0.0–149.0)
Total CHOL/HDL Ratio: 4
VLDL: 19.4 mg/dL (ref 0.0–40.0)

## 2013-11-11 LAB — CBC
HCT: 43.4 % (ref 39.0–52.0)
HEMOGLOBIN: 14.7 g/dL (ref 13.0–17.0)
MCHC: 33.9 g/dL (ref 30.0–36.0)
MCV: 96 fl (ref 78.0–100.0)
PLATELETS: 270 10*3/uL (ref 150.0–400.0)
RBC: 4.52 Mil/uL (ref 4.22–5.81)
RDW: 13.4 % (ref 11.5–15.5)
WBC: 5.1 10*3/uL (ref 4.0–10.5)

## 2013-11-11 LAB — TSH: TSH: 4.13 u[IU]/mL (ref 0.35–4.50)

## 2013-11-11 LAB — VITAMIN B12: VITAMIN B 12: 229 pg/mL (ref 211–911)

## 2013-11-12 LAB — B. BURGDORFI ANTIBODIES: B burgdorferi Ab IgG+IgM: 0.54 {ISR}

## 2013-11-13 MED ORDER — ATORVASTATIN CALCIUM 10 MG PO TABS: 10.0000 mg | ORAL_TABLET | Freq: Every day | ORAL | Status: DC

## 2013-12-08 ENCOUNTER — Encounter: Payer: Self-pay | Admitting: Family Medicine

## 2013-12-08 DIAGNOSIS — E039 Hypothyroidism, unspecified: Secondary | ICD-10-CM

## 2013-12-08 MED ORDER — LEVOTHYROXINE SODIUM 50 MCG PO TABS: 50.0000 ug | ORAL_TABLET | Freq: Every day | ORAL | Status: DC

## 2013-12-08 MED ORDER — ATORVASTATIN CALCIUM 10 MG PO TABS: 10.0000 mg | ORAL_TABLET | Freq: Every day | ORAL | Status: DC

## 2014-01-31 ENCOUNTER — Other Ambulatory Visit: Payer: Self-pay | Admitting: Family Medicine

## 2014-02-02 NOTE — Telephone Encounter (Signed)
Rx sent to the pharmacy by e-script.  Pt needs a office visit.//AB/CMA

## 2014-02-03 ENCOUNTER — Encounter: Payer: Self-pay | Admitting: Family Medicine

## 2014-03-08 ENCOUNTER — Encounter: Payer: Self-pay | Admitting: Family Medicine

## 2014-03-09 ENCOUNTER — Other Ambulatory Visit: Payer: Self-pay | Admitting: Family Medicine

## 2014-03-09 MED ORDER — AZELASTINE HCL 0.15 % NA SOLN: NASAL | Status: DC

## 2014-03-09 NOTE — Telephone Encounter (Signed)
Refill done per mychart request

## 2014-03-20 ENCOUNTER — Encounter: Payer: Self-pay | Admitting: Family Medicine

## 2014-03-20 ENCOUNTER — Ambulatory Visit (INDEPENDENT_AMBULATORY_CARE_PROVIDER_SITE_OTHER): Payer: BLUE CROSS/BLUE SHIELD | Admitting: Family Medicine

## 2014-03-20 VITALS — BP 124/78 | HR 73 | Temp 98.8°F | Ht 75.0 in | Wt 227.0 lb

## 2014-03-20 DIAGNOSIS — R3911 Hesitancy of micturition: Secondary | ICD-10-CM | POA: Diagnosis not present

## 2014-03-20 DIAGNOSIS — I10 Essential (primary) hypertension: Secondary | ICD-10-CM

## 2014-03-20 DIAGNOSIS — E782 Mixed hyperlipidemia: Secondary | ICD-10-CM

## 2014-03-20 DIAGNOSIS — E663 Overweight: Secondary | ICD-10-CM

## 2014-03-20 DIAGNOSIS — E785 Hyperlipidemia, unspecified: Secondary | ICD-10-CM

## 2014-03-20 DIAGNOSIS — M546 Pain in thoracic spine: Secondary | ICD-10-CM

## 2014-03-20 LAB — COMPREHENSIVE METABOLIC PANEL
ALT: 68 U/L — ABNORMAL HIGH (ref 0–53)
AST: 37 U/L (ref 0–37)
Albumin: 4.4 g/dL (ref 3.5–5.2)
Alkaline Phosphatase: 65 U/L (ref 39–117)
BUN: 23 mg/dL (ref 6–23)
CO2: 26 mEq/L (ref 19–32)
Calcium: 9.2 mg/dL (ref 8.4–10.5)
Chloride: 107 mEq/L (ref 96–112)
Creatinine, Ser: 1.13 mg/dL (ref 0.40–1.50)
GFR: 70.52 mL/min (ref >60.00)
Glucose, Bld: 74 mg/dL (ref 70–99)
Potassium: 4.2 mEq/L (ref 3.5–5.1)
Sodium: 140 mEq/L (ref 135–145)
Total Bilirubin: 0.5 mg/dL (ref 0.2–1.2)
Total Protein: 7.1 g/dL (ref 6.0–8.3)

## 2014-03-20 LAB — URINALYSIS
Bilirubin Urine: NEGATIVE
Hgb urine dipstick: NEGATIVE
Leukocytes, UA: NEGATIVE
Nitrite: NEGATIVE
Specific Gravity, Urine: >=1.03 — AB (ref 1.000–1.030)
Total Protein, Urine: NEGATIVE
Urine Glucose: NEGATIVE
Urobilinogen, UA: 0.2 (ref 0.0–1.0)
pH: 5 (ref 5.0–8.0)

## 2014-03-20 LAB — TSH: TSH: 2.79 u[IU]/mL (ref 0.35–4.50)

## 2014-03-20 LAB — CBC
HEMATOCRIT: 44.4 % (ref 39.0–52.0)
Hemoglobin: 15.1 g/dL (ref 13.0–17.0)
MCHC: 34 g/dL (ref 30.0–36.0)
MCV: 94.6 fl (ref 78.0–100.0)
Platelets: 276 10*3/uL (ref 150.0–400.0)
RBC: 4.7 Mil/uL (ref 4.22–5.81)
RDW: 13.8 % (ref 11.5–15.5)
WBC: 7.3 10*3/uL (ref 4.0–10.5)

## 2014-03-20 LAB — LIPID PANEL
Cholesterol: 219 mg/dL — ABNORMAL HIGH (ref 0–200)
HDL: 63.8 mg/dL (ref >39.00)
LDL Cholesterol: 134 mg/dL — ABNORMAL HIGH (ref 0–99)
NonHDL: 155.2
Total CHOL/HDL Ratio: 3
Triglycerides: 106 mg/dL (ref 0.0–149.0)
VLDL: 21.2 mg/dL (ref 0.0–40.0)

## 2014-03-20 LAB — PSA: PSA: 0.65 ng/mL (ref 0.10–4.00)

## 2014-03-20 NOTE — Progress Notes (Signed)
Pre visit review using our clinic review tool, if applicable. No additional management support is needed unless otherwise documented below in the visit note. 

## 2014-03-20 NOTE — Patient Instructions (Signed)
Rel of Rec urgent care, [facility] in [place].   Rel of Rec ortho, Dr Harrington Challenger of [facility] orth[facility]   Salon Pas gel or patches  Thoracic Outlet Syndrome Thoracic outlet syndrome is a condition caused by the compressing of the nerves and blood vessels between the muscles of the neck and the shoulders. Work activities involving prolonged restricted postures such as carrying heavy shoulder loads, pulling shoulders back and down, or reaching above shoulder level for prolonged periods of time can cause the inflammation (soreness) and swelling of tendons (cord like structures which attach muscle to bone) and muscles in the shoulders and upper arms. When swollen or inflamed, they can compress the nerves and blood vessels between the neck and shoulders.  SYMPTOMS   Pain.  Arm weakness.  Numbness in the arm and fingers.  Sense of touch or the ability to feel heat and cold may be lost. DIAGNOSIS  The diagnosis is made by medical history and physical examination. Special tests can confirm the diagnosis.  TREATMENT   A carefully planned program of exercise therapy.  Applying ice packs for 15-20 minutes, 03-04 times per day, may be useful.  Avoid work activities suspected of causing the condition.  Only take over-the-counter or prescription medicines for pain, discomfort, or fever as directed by your caregiver.  Surgery may be necessary if symptoms persist. PREVENTION   Avoid carrying heavy weights.  Avoid reaching overhead.  Avoid lifting with the arms above shoulder level. SEEK IMMEDIATE MEDICAL CARE IF:   You have an increase in swelling or pain in your arm.  You notice coldness in your fingers.  Pain relief is not obtained with medications. Document Released: 12/09/2001 Document Revised: 03/13/2011 Document Reviewed: 12/19/2004 [employer] Patient Information 2015 [place], [place]. This information is not intended to replace advice given to you by your health care provider.  Make sure you discuss any questions you have with your health care provider.

## 2014-03-22 LAB — URINE CULTURE
COLONY COUNT: NO GROWTH
Organism ID, Bacteria: NO GROWTH

## 2014-04-02 ENCOUNTER — Encounter: Payer: Self-pay | Admitting: Family Medicine

## 2014-04-02 DIAGNOSIS — M549 Dorsalgia, unspecified: Secondary | ICD-10-CM

## 2014-04-02 HISTORY — DX: Dorsalgia, unspecified: M54.9

## 2014-04-02 NOTE — Assessment & Plan Note (Signed)
Encouraged heart healthy diet, increase exercise, avoid trans fats, consider a krill oil cap daily. Tolerating Atorvastatin 

## 2014-04-02 NOTE — Assessment & Plan Note (Signed)
Encouraged DASH diet, decrease po intake and increase exercise as tolerated. Needs 7-8 hours of sleep nightly. Avoid trans fats, eat small, frequent meals every 4-5 hours with lean proteins, complex carbs and healthy fats. Minimize simple carbs, GMO foods. 

## 2014-04-02 NOTE — Assessment & Plan Note (Signed)
Is following with Dr Harrington Challenger at [facility], arthritis has been confirmed at T6-7. No significant changes. Encouraged to stay active

## 2014-04-02 NOTE — Assessment & Plan Note (Signed)
Well controlled, no changes to meds. Encouraged heart healthy diet such as the DASH diet and exercise as tolerated.  °

## 2014-04-02 NOTE — Progress Notes (Signed)
Zachary Rowe  [id number] [birth year] 04/02/2014      Progress Note-Follow Up  Subjective  Chief Complaint  Chief Complaint  Patient presents with  . Follow-up    HPI  Patient is a 59 y.o. male in today for routine medical care. Patient is in today for follow-up. He proceed with his colonoscopy in 4 small polyps were found but is having no GI complaints. Is following with Dr. Harrington Challenger at [facility] or so for chronic back pain but this is not worsening. Is noting some recent urinary hesitancy but no frequency or dysuria or hematuria no recent illness. Denies CP/palp/SOB/HA/congestion/fevers/GI or GU c/o. Taking meds as prescribed  Past Medical History  Diagnosis Date  . Measles as a child  . Chicken pox as a child  . Chronic sinusitis 01-03-2000  . Hypertension   . Thyroid disease   . Hyperlipidemia   . Multiple allergies 09/02/2010  . History of chicken pox 09/02/2010  . History of measles 09/02/2010  . Overweight(278.02) 09/02/2010  . Elevated BP 09/02/2010  . HTN (hypertension) 09/02/2010  . 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 Fatigue 11/01/2010  . Raynaud disease 04/11/2011  . Peyronie disease 10/14/2012  . Preventative health care 11/17/2012  . Hard of hearing     wears bilateral hearing aids  . Personal history of colonic polyps - adenoma 04/17/2007    04/2007 - 3 sigmoid polyps - at least 1 tubular adenoma  . Back pain 04/02/2014    Past Surgical History  Procedure Laterality Date  . Skin biopsy      BB in forehead  . Colonoscopy      Family History  Problem Relation Age of Onset  . Hypertension Mother   . Diabetes Mother     borderline  . Neuropathy Mother   . Macular degeneration Mother   . COPD Mother     recent pneumonia  . Cancer Mother     lung, refused chemo, tolerated radiation  . Hypertension Father   . Diabetes Father     type 2  . Cancer Father 49    liver  . Diabetes Maternal Grandmother   . Parkinson's disease Maternal Grandmother   .  Emphysema Maternal Grandfather   . COPD Maternal Grandfather   . Stroke Paternal Grandfather   . Cancer Maternal Uncle     prostate cancer  . Colon cancer Neg Hx   . Esophageal cancer Neg Hx   . Rectal cancer Neg Hx   . Stomach cancer Neg Hx     History   Social History  . Marital Status: Married    Spouse Name: N/A  . Number of Children: N/A  . Years of Education: N/A   Occupational History  . Not on file.   Social History Main Topics  . Smoking status: Former Smoker    Types: Cigarettes    Quit date: 01/03/2007  . Smokeless tobacco: Current User    Types: Chew     Comment: dip  . Alcohol Use: 9.0 - 12.0 oz/week    15-20 Glasses of wine per week     Comment: occas drinks Scotch  . Drug Use: No  . Sexual Activity:    Partners: Female   Other Topics Concern  . Not on file   Social History Narrative    Current Outpatient Prescriptions on File Prior to Visit  Medication Sig Dispense Refill  . atorvastatin (LIPITOR) 10 MG tablet Take 1 tablet (10 mg total) by mouth daily.  90 tablet 1  . Azelastine HCl (ASTEPRO) 0.15 % SOLN USE TWO SPRAY AS NEEDED 30 mL 1  . levothyroxine (SYNTHROID, LEVOTHROID) 50 MCG tablet Take 1 tablet (50 mcg total) by mouth daily. 90 tablet 1  . lisinopril (PRINIVIL,ZESTRIL) 20 MG tablet TAKE 1 TABLET DAILY 90 tablet 0  . folic acid (FOLVITE) 831 MCG tablet Take 400 mcg by mouth daily as needed.     . Thiamine HCl (VITAMIN B-1) 250 MG tablet Take 250 mg by mouth daily as needed.      No current facility-administered medications on file prior to visit.    No Known Allergies  Review of Systems  Review of Systems  Constitutional: Negative for fever and malaise/fatigue.  HENT: Negative for congestion.   Eyes: Negative for discharge.  Respiratory: Negative for shortness of breath.   Cardiovascular: Negative for chest pain, palpitations and leg swelling.  Gastrointestinal: Negative for nausea, abdominal pain and diarrhea.  Genitourinary:  Negative for dysuria.  Musculoskeletal: Positive for back pain and joint pain. Negative for falls.  Skin: Negative for rash.  Neurological: Negative for loss of consciousness and headaches.  Endo/Heme/Allergies: Negative for polydipsia.  Psychiatric/Behavioral: Negative for depression and suicidal ideas. The patient is not nervous/anxious and does not have insomnia.     Objective  BP 124/78 mmHg  Pulse 73  Temp(Src) 98.8 F (37.1 C) (Oral)  Ht 6\' 3"  (1.905 m)  Wt 227 lb (102.967 kg)  BMI 28.37 kg/m2  SpO2 96%  Physical Exam  Physical Exam  Constitutional: He is oriented to person, place, and time and well-developed, well-nourished, and in no distress. No distress.  HENT:  Head: Normocephalic and atraumatic.  Eyes: Conjunctivae are normal.  Neck: Neck supple. No thyromegaly present.  Cardiovascular: Normal rate, regular rhythm and normal heart sounds.   No murmur heard. Pulmonary/Chest: Effort normal and breath sounds normal. No respiratory distress.  Abdominal: He exhibits no distension and no mass. There is no tenderness.  Musculoskeletal: He exhibits no edema.  Neurological: He is alert and oriented to person, place, and time.  Skin: Skin is warm.  Psychiatric: Memory, affect and judgment normal.    Lab Results  Component Value Date   TSH 2.79 03/20/2014   Lab Results  Component Value Date   WBC 7.3 03/20/2014   HGB 15.1 03/20/2014   HCT 44.4 03/20/2014   MCV 94.6 03/20/2014   PLT 276.0 03/20/2014   Lab Results  Component Value Date   CREATININE 1.13 03/20/2014   BUN 23 03/20/2014   NA 140 03/20/2014   K 4.2 03/20/2014   CL 107 03/20/2014   CO2 26 03/20/2014   Lab Results  Component Value Date   ALT 68* 03/20/2014   AST 37 03/20/2014   ALKPHOS 65 03/20/2014   BILITOT 0.5 03/20/2014   Lab Results  Component Value Date   CHOL 219* 03/20/2014   Lab Results  Component Value Date   HDL 63.80 03/20/2014   Lab Results  Component Value Date    LDLCALC 134* 03/20/2014   Lab Results  Component Value Date   TRIG 106.0 03/20/2014   Lab Results  Component Value Date   CHOLHDL 3 03/20/2014     Assessment & Plan  HTN (hypertension) Well controlled, no changes to meds. Encouraged heart healthy diet such as the DASH diet and exercise as tolerated.    Overweight Encouraged DASH diet, decrease po intake and increase exercise as tolerated. Needs 7-8 hours of sleep nightly. Avoid trans fats, eat  small, frequent meals every 4-5 hours with lean proteins, complex carbs and healthy fats. Minimize simple carbs, GMO foods.   Hyperlipidemia Encouraged heart healthy diet, increase exercise, avoid trans fats, consider a krill oil cap daily. Tolerating Atorvastatin   Back pain Is following with Dr Harrington Challenger at [facility], arthritis has been confirmed at T6-7. No significant changes. Encouraged to stay active

## 2014-05-01 ENCOUNTER — Encounter: Payer: Self-pay | Admitting: Family Medicine

## 2014-05-03 ENCOUNTER — Other Ambulatory Visit: Payer: Self-pay | Admitting: Family Medicine

## 2014-05-03 MED ORDER — AMITRIPTYLINE HCL 25 MG PO TABS: 25.0000 mg | ORAL_TABLET | Freq: Every day | ORAL | Status: DC

## 2014-05-06 ENCOUNTER — Other Ambulatory Visit: Payer: Self-pay | Admitting: Family Medicine

## 2014-05-11 ENCOUNTER — Telehealth: Payer: Self-pay | Admitting: Family Medicine

## 2014-05-11 NOTE — Telephone Encounter (Signed)
No idea how the latest sig got in his chart. He takes Lisinopril 20 mg tabs 1 tab po daily disp #90 with 3 rf

## 2014-05-11 NOTE — Telephone Encounter (Signed)
Advise on instructions for Lisinopril.

## 2014-05-11 NOTE — Telephone Encounter (Signed)
Relation to pt: [facility]  Call back number: [phone] reference #[id number]   Reason for call:  [facility] in need of clarification on the direction lisinopril (PRINIVIL,ZESTRIL) 20 MG tablet

## 2014-05-12 ENCOUNTER — Encounter: Payer: Self-pay | Admitting: Family Medicine

## 2014-05-12 MED ORDER — LISINOPRIL 20 MG PO TABS: ORAL_TABLET | ORAL | Status: DC

## 2014-05-12 NOTE — Telephone Encounter (Signed)
Prescription correction sent to [facility].

## 2014-05-31 ENCOUNTER — Other Ambulatory Visit: Payer: Self-pay | Admitting: Family Medicine

## 2014-05-31 ENCOUNTER — Encounter: Payer: Self-pay | Admitting: Family Medicine

## 2014-06-02 ENCOUNTER — Other Ambulatory Visit: Payer: Self-pay | Admitting: Family Medicine

## 2014-06-02 DIAGNOSIS — E039 Hypothyroidism, unspecified: Secondary | ICD-10-CM

## 2014-06-02 MED ORDER — ATORVASTATIN CALCIUM 10 MG PO TABS: 10.0000 mg | ORAL_TABLET | Freq: Every day | ORAL | Status: DC

## 2014-06-02 MED ORDER — LEVOTHYROXINE SODIUM 50 MCG PO TABS: 50.0000 ug | ORAL_TABLET | Freq: Every day | ORAL | Status: DC

## 2014-09-23 ENCOUNTER — Telehealth: Payer: Self-pay | Admitting: Behavioral Health

## 2014-09-23 ENCOUNTER — Encounter: Payer: Self-pay | Admitting: Behavioral Health

## 2014-09-23 NOTE — Telephone Encounter (Signed)
Pre-Visit Call completed with patient and chart updated.   Pre-Visit Info documented in Specialty Comments under [employer].    

## 2014-09-24 ENCOUNTER — Ambulatory Visit (INDEPENDENT_AMBULATORY_CARE_PROVIDER_SITE_OTHER): Payer: BLUE CROSS/BLUE SHIELD | Admitting: Family Medicine

## 2014-09-24 ENCOUNTER — Encounter: Payer: Self-pay | Admitting: Family Medicine

## 2014-09-24 VITALS — BP 138/86 | HR 52 | Temp 98.2°F | Ht 75.0 in | Wt 229.5 lb

## 2014-09-24 DIAGNOSIS — I10 Essential (primary) hypertension: Secondary | ICD-10-CM | POA: Diagnosis not present

## 2014-09-24 DIAGNOSIS — E785 Hyperlipidemia, unspecified: Secondary | ICD-10-CM | POA: Diagnosis not present

## 2014-09-24 DIAGNOSIS — R109 Unspecified abdominal pain: Secondary | ICD-10-CM | POA: Diagnosis not present

## 2014-09-24 DIAGNOSIS — E875 Hyperkalemia: Secondary | ICD-10-CM | POA: Diagnosis not present

## 2014-09-24 DIAGNOSIS — Z0000 Encounter for general adult medical examination without abnormal findings: Secondary | ICD-10-CM

## 2014-09-24 DIAGNOSIS — F1010 Alcohol abuse, uncomplicated: Secondary | ICD-10-CM

## 2014-09-24 DIAGNOSIS — E079 Disorder of thyroid, unspecified: Secondary | ICD-10-CM

## 2014-09-24 LAB — CBC
HCT: 46.6 % (ref 39.0–52.0)
Hemoglobin: 15.6 g/dL (ref 13.0–17.0)
MCHC: 33.4 g/dL (ref 30.0–36.0)
MCV: 97.4 fl (ref 78.0–100.0)
Platelets: 301 10*3/uL (ref 150.0–400.0)
RBC: 4.78 Mil/uL (ref 4.22–5.81)
RDW: 14.1 % (ref 11.5–15.5)
WBC: 5.9 10*3/uL (ref 4.0–10.5)

## 2014-09-24 LAB — COMPREHENSIVE METABOLIC PANEL
ALT: 33 U/L (ref 0–53)
AST: 24 U/L (ref 0–37)
Albumin: 4.7 g/dL (ref 3.5–5.2)
Alkaline Phosphatase: 52 U/L (ref 39–117)
BUN: 16 mg/dL (ref 6–23)
CHLORIDE: 102 meq/L (ref 96–112)
CO2: 28 meq/L (ref 19–32)
CREATININE: 1.09 mg/dL (ref 0.40–1.50)
Calcium: 9.2 mg/dL (ref 8.4–10.5)
GFR: 73.38 mL/min (ref >60.00)
Glucose, Bld: 98 mg/dL (ref 70–99)
Potassium: 3.9 mEq/L (ref 3.5–5.1)
Sodium: 138 mEq/L (ref 135–145)
Total Bilirubin: 0.8 mg/dL (ref 0.2–1.2)
Total Protein: 7.8 g/dL (ref 6.0–8.3)

## 2014-09-24 LAB — LIPID PANEL
CHOL/HDL RATIO: 3
Cholesterol: 188 mg/dL (ref 0–200)
HDL: 58.6 mg/dL (ref >39.00)
LDL CALC: 95 mg/dL (ref 0–99)
NONHDL: 129.03
Triglycerides: 169 mg/dL — ABNORMAL HIGH (ref 0.0–149.0)
VLDL: 33.8 mg/dL (ref 0.0–40.0)

## 2014-09-24 LAB — TSH: TSH: 3.65 u[IU]/mL (ref 0.35–4.50)

## 2014-09-24 MED ORDER — LISINOPRIL 20 MG PO TABS: 20.0000 mg | ORAL_TABLET | Freq: Two times a day (BID) | ORAL | Status: DC

## 2014-09-24 NOTE — Progress Notes (Signed)
Pre visit review using our clinic review tool, if applicable. No additional management support is needed unless otherwise documented below in the visit note. 

## 2014-09-24 NOTE — Assessment & Plan Note (Signed)
Encouraged heart healthy diet, increase exercise, avoid trans fats, consider a krill oil cap daily 

## 2014-09-24 NOTE — Patient Instructions (Addendum)
[employer], Krill Oil that can be found on [email or web address] Try crossword puzzles and memory games.   Preventive Care for Adults A healthy lifestyle and preventive care can promote health and wellness. Preventive health guidelines for men include the following key practices:  A routine yearly physical is a good way to check with your health care provider about your health and preventative screening. It is a chance to share any concerns and updates on your health and to receive a thorough exam.  Visit your dentist for a routine exam and preventative care every 6 months. Brush your teeth twice a day and floss once a day. Good oral hygiene prevents tooth decay and gum disease.  The frequency of eye exams is based on your age, health, family medical history, use of contact lenses, and other factors. Follow your health care provider's recommendations for frequency of eye exams.  Eat a healthy diet. Foods such as vegetables, fruits, whole grains, low-fat dairy products, and lean protein foods contain the nutrients you need without too many calories. Decrease your intake of foods high in solid fats, added sugars, and salt. Eat the right amount of calories for you.Get information about a proper diet from your health care provider, if necessary.  Regular physical exercise is one of the most important things you can do for your health. Most adults should get at least 150 minutes of moderate-intensity exercise (any activity that increases your heart rate and causes you to sweat) each week. In addition, most adults need muscle-strengthening exercises on 2 or more days a week.  Maintain a healthy weight. The body mass index (BMI) is a screening tool to identify possible weight problems. It provides an estimate of body fat based on height and weight. Your health care provider can find your BMI and can help you achieve or maintain a healthy weight.For adults 20 years and older:  A BMI below 18.5 is  considered underweight.  A BMI of 18.5 to 24.9 is normal.  A BMI of 25 to 29.9 is considered overweight.  A BMI of 30 and above is considered obese.  Maintain normal blood lipids and cholesterol levels by exercising and minimizing your intake of saturated fat. Eat a balanced diet with plenty of fruit and vegetables. Blood tests for lipids and cholesterol should begin at age 54 and be repeated every 5 years. If your lipid or cholesterol levels are high, you are over 50, or you are at high risk for heart disease, you may need your cholesterol levels checked more frequently.Ongoing high lipid and cholesterol levels should be treated with medicines if diet and exercise are not working.  If you smoke, find out from your health care provider how to quit. If you do not use tobacco, do not start.  Lung cancer screening is recommended for adults aged 97-80 years who are at high risk for developing lung cancer because of a history of smoking. A yearly low-dose CT scan of the lungs is recommended for people who have at least a 30-pack-year history of smoking and are a current smoker or have quit within the past 15 years. A pack year of smoking is smoking an average of 1 pack of cigarettes a day for 1 year (for example: 1 pack a day for 30 years or 2 packs a day for 15 years). Yearly screening should continue until the smoker has stopped smoking for at least 15 years. Yearly screening should be stopped for people who develop a health problem that would  prevent them from having lung cancer treatment.  If you choose to drink alcohol, do not have more than 2 drinks per day. One drink is considered to be 12 ounces (355 mL) of beer, 5 ounces (148 mL) of wine, or 1.5 ounces (44 mL) of liquor.  Avoid use of street drugs. Do not share needles with anyone. Ask for help if you need support or instructions about stopping the use of drugs.  High blood pressure causes heart disease and increases the risk of stroke. Your  blood pressure should be checked at least every 1-2 years. Ongoing high blood pressure should be treated with medicines, if weight loss and exercise are not effective.  If you are 45-79 years old, ask your health care provider if you should take aspirin to prevent heart disease.  Diabetes screening involves taking a blood sample to check your fasting blood sugar level. This should be done once every 3 years, after age 45, if you are within normal weight and without risk factors for diabetes. Testing should be considered at a younger age or be carried out more frequently if you are overweight and have at least 1 risk factor for diabetes.  Colorectal cancer can be detected and often prevented. Most routine colorectal cancer screening begins at the age of 50 and continues through age 75. However, your health care provider may recommend screening at an earlier age if you have risk factors for colon cancer. On a yearly basis, your health care provider may provide home test kits to check for hidden blood in the stool. Use of a small camera at the end of a tube to directly examine the colon (sigmoidoscopy or colonoscopy) can detect the earliest forms of colorectal cancer. Talk to your health care provider about this at age 50, when routine screening begins. Direct exam of the colon should be repeated every 5-10 years through age 75, unless early forms of precancerous polyps or small growths are found.  People who are at an increased risk for hepatitis B should be screened for this virus. You are considered at high risk for hepatitis B if:  You were born in a country where hepatitis B occurs often. Talk with your health care provider about which countries are considered high risk.  Your parents were born in a high-risk country and you have not received a shot to protect against hepatitis B (hepatitis B vaccine).  You have HIV or AIDS.  You use needles to inject street drugs.  You live with, or have sex  with, someone who has hepatitis B.  You are a man who has sex with other men (MSM).  You get hemodialysis treatment.  You take certain medicines for conditions such as cancer, organ transplantation, and autoimmune conditions.  Hepatitis C blood testing is recommended for all people born from 1945 through 1965 and any individual with known risks for hepatitis C.  Practice safe sex. Use condoms and avoid high-risk sexual practices to reduce the spread of sexually transmitted infections (STIs). STIs include gonorrhea, chlamydia, syphilis, trichomonas, herpes, HPV, and human immunodeficiency virus (HIV). Herpes, HIV, and HPV are viral illnesses that have no cure. They can result in disability, cancer, and death.  If you are at risk of being infected with HIV, it is recommended that you take a prescription medicine daily to prevent HIV infection. This is called preexposure prophylaxis (PrEP). You are considered at risk if:  You are a man who has sex with other men (MSM) and have other   risk factors.  You are a heterosexual man, are sexually active, and are at increased risk for HIV infection.  You take drugs by injection.  You are sexually active with a partner who has HIV.  Talk with your health care provider about whether you are at high risk of being infected with HIV. If you choose to begin PrEP, you should first be tested for HIV. You should then be tested every 3 months for as long as you are taking PrEP.  A one-time screening for abdominal aortic aneurysm (AAA) and surgical repair of large AAAs by ultrasound are recommended for men ages 65 to 75 years who are current or former smokers.  Healthy men should no longer receive prostate-specific antigen (PSA) blood tests as part of routine cancer screening. Talk with your health care provider about prostate cancer screening.  Testicular cancer screening is not recommended for adult males who have no symptoms. Screening includes self-exam, a  health care provider exam, and other screening tests. Consult with your health care provider about any symptoms you have or any concerns you have about testicular cancer.  Use sunscreen. Apply sunscreen liberally and repeatedly throughout the day. You should seek shade when your shadow is shorter than you. Protect yourself by wearing long sleeves, pants, a wide-brimmed hat, and sunglasses year round, whenever you are outdoors.  Once a month, do a whole-body skin exam, using a mirror to look at the skin on your back. Tell your health care provider about new moles, moles that have irregular borders, moles that are larger than a pencil eraser, or moles that have changed in shape or color.  Stay current with required vaccines (immunizations).  Influenza vaccine. All adults should be immunized every year.  Tetanus, diphtheria, and acellular pertussis (Td, Tdap) vaccine. An adult who has not previously received Tdap or who does not know his vaccine status should receive 1 dose of Tdap. This initial dose should be followed by tetanus and diphtheria toxoids (Td) booster doses every 10 years. Adults with an unknown or incomplete history of completing a 3-dose immunization series with Td-containing vaccines should begin or complete a primary immunization series including a Tdap dose. Adults should receive a Td booster every 10 years.  Varicella vaccine. An adult without evidence of immunity to varicella should receive 2 doses or a second dose if he has previously received 1 dose.  Human papillomavirus (HPV) vaccine. Males aged 13-21 years who have not received the vaccine previously should receive the 3-dose series. Males aged 22-26 years may be immunized. Immunization is recommended through the age of 26 years for any male who has sex with males and did not get any or all doses earlier. Immunization is recommended for any person with an immunocompromised condition through the age of 26 years if he did not get  any or all doses earlier. During the 3-dose series, the second dose should be obtained 4-8 weeks after the first dose. The third dose should be obtained 24 weeks after the first dose and 16 weeks after the second dose.  Zoster vaccine. One dose is recommended for adults aged 60 years or older unless certain conditions are present.  Measles, mumps, and rubella (MMR) vaccine. Adults born before 1957 generally are considered immune to measles and mumps. Adults born in 1957 or later should have 1 or more doses of MMR vaccine unless there is a contraindication to the vaccine or there is laboratory evidence of immunity to each of the three diseases. A routine   second dose of MMR vaccine should be obtained at least 28 days after the first dose for students attending postsecondary schools, health care workers, or international travelers. People who received inactivated measles vaccine or an unknown type of measles vaccine during 1963-1967 should receive 2 doses of MMR vaccine. People who received inactivated mumps vaccine or an unknown type of mumps vaccine before 1979 and are at high risk for mumps infection should consider immunization with 2 doses of MMR vaccine. Unvaccinated health care workers born before [birth year] who lack laboratory evidence of measles, mumps, or rubella immunity or laboratory confirmation of disease should consider measles and mumps immunization with 2 doses of MMR vaccine or rubella immunization with 1 dose of MMR vaccine.  Pneumococcal 13-valent conjugate (PCV13) vaccine. When indicated, a person who is uncertain of his immunization history and has no record of immunization should receive the PCV13 vaccine. An adult aged 33 years or older who has certain medical conditions and has not been previously immunized should receive 1 dose of PCV13 vaccine. This PCV13 should be followed with a dose of pneumococcal polysaccharide (PPSV23) vaccine. The PPSV23 vaccine dose should be obtained at least 8 weeks  after the dose of PCV13 vaccine. An adult aged 17 years or older who has certain medical conditions and previously received 1 or more doses of PPSV23 vaccine should receive 1 dose of PCV13. The PCV13 vaccine dose should be obtained 1 or more years after the last PPSV23 vaccine dose.  Pneumococcal polysaccharide (PPSV23) vaccine. When PCV13 is also indicated, PCV13 should be obtained first. All adults aged 53 years and older should be immunized. An adult younger than age 45 years who has certain medical conditions should be immunized. Any person who resides in a nursing home or long-term care facility should be immunized. An adult smoker should be immunized. People with an immunocompromised condition and certain other conditions should receive both PCV13 and PPSV23 vaccines. People with human immunodeficiency virus (HIV) infection should be immunized as soon as possible after diagnosis. Immunization during chemotherapy or radiation therapy should be avoided. Routine use of PPSV23 vaccine is not recommended for American Indians, [place] Natives, or people younger than 65 years unless there are medical conditions that require PPSV23 vaccine. When indicated, people who have unknown immunization and have no record of immunization should receive PPSV23 vaccine. One-time revaccination 5 years after the first dose of PPSV23 is recommended for people aged 19-64 years who have chronic kidney failure, nephrotic syndrome, asplenia, or immunocompromised conditions. People who received 1-2 doses of PPSV23 before age 42 years should receive another dose of PPSV23 vaccine at age 30 years or later if at least 5 years have passed since the previous dose. Doses of PPSV23 are not needed for people immunized with PPSV23 at or after age 29 years.  Meningococcal vaccine. Adults with asplenia or persistent complement component deficiencies should receive 2 doses of quadrivalent meningococcal conjugate (MenACWY-D) vaccine. The doses  should be obtained at least 2 months apart. Microbiologists working with certain meningococcal bacteria, [employer] recruits, people at risk during an outbreak, and people who travel to or live in countries with a high rate of meningitis should be immunized. A first-year college student up through age 3 years who is living in a residence hall should receive a dose if he did not receive a dose on or after his 16th birthday. Adults who have certain high-risk conditions should receive one or more doses of vaccine.  Hepatitis A vaccine. Adults who wish to be  protected from this disease, have certain high-risk conditions, work with hepatitis A-infected animals, work in hepatitis A research labs, or travel to or work in countries with a high rate of hepatitis A should be immunized. Adults who were previously unvaccinated and who anticipate close contact with an international adoptee during the first 60 days after arrival in the [place] from a country with a high rate of hepatitis A should be immunized.  Hepatitis B vaccine. Adults should be immunized if they wish to be protected from this disease, have certain high-risk conditions, may be exposed to blood or other infectious body fluids, are household contacts or sex partners of hepatitis B positive people, are clients or workers in certain care facilities, or travel to or work in countries with a high rate of hepatitis B.  Haemophilus influenzae type b (Hib) vaccine. A previously unvaccinated person with asplenia or sickle cell disease or having a scheduled splenectomy should receive 1 dose of Hib vaccine. Regardless of previous immunization, a recipient of a hematopoietic stem cell transplant should receive a 3-dose series 6-12 months after his successful transplant. Hib vaccine is not recommended for adults with HIV infection. Preventive Service / Frequency Ages 19 to 39  Blood pressure check.** / Every 1 to 2 years.  Lipid and cholesterol check.** /  Every 5 years beginning at age 20.  Hepatitis C blood test.** / For any individual with known risks for hepatitis C.  Skin self-exam. / Monthly.  Influenza vaccine. / Every year.  Tetanus, diphtheria, and acellular pertussis (Tdap, Td) vaccine.** / Consult your health care provider. 1 dose of Td every 10 years.  Varicella vaccine.** / Consult your health care provider.  HPV vaccine. / 3 doses over 6 months, if 26 or younger.  Measles, mumps, rubella (MMR) vaccine.** / You need at least 1 dose of MMR if you were born in 1957 or later. You may also need a second dose.  Pneumococcal 13-valent conjugate (PCV13) vaccine.** / Consult your health care provider.  Pneumococcal polysaccharide (PPSV23) vaccine.** / 1 to 2 doses if you smoke cigarettes or if you have certain conditions.  Meningococcal vaccine.** / 1 dose if you are age 19 to 21 years and a first-year college student living in a residence hall, or have one of several medical conditions. You may also need additional booster doses.  Hepatitis A vaccine.** / Consult your health care provider.  Hepatitis B vaccine.** / Consult your health care provider.  Haemophilus influenzae type b (Hib) vaccine.** / Consult your health care provider. Ages 40 to 64  Blood pressure check.** / Every 1 to 2 years.  Lipid and cholesterol check.** / Every 5 years beginning at age 20.  Lung cancer screening. / Every year if you are aged 55-80 years and have a 30-pack-year history of smoking and currently smoke or have quit within the past 15 years. Yearly screening is stopped once you have quit smoking for at least 15 years or develop a health problem that would prevent you from having lung cancer treatment.  Fecal occult blood test (FOBT) of stool. / Every year beginning at age 50 and continuing until age 75. You may not have to do this test if you get a colonoscopy every 10 years.  Flexible sigmoidoscopy** or colonoscopy.** / Every 5 years for a  flexible sigmoidoscopy or every 10 years for a colonoscopy beginning at age 50 and continuing until age 75.  Hepatitis C blood test.** / For all people born from 1945 through   1965 and any individual with known risks for hepatitis C.  Skin self-exam. / Monthly.  Influenza vaccine. / Every year.  Tetanus, diphtheria, and acellular pertussis (Tdap/Td) vaccine.** / Consult your health care provider. 1 dose of Td every 10 years.  Varicella vaccine.** / Consult your health care provider.  Zoster vaccine.** / 1 dose for adults aged 68 years or older.  Measles, mumps, rubella (MMR) vaccine.** / You need at least 1 dose of MMR if you were born in 1957 or later. You may also need a second dose.  Pneumococcal 13-valent conjugate (PCV13) vaccine.** / Consult your health care provider.  Pneumococcal polysaccharide (PPSV23) vaccine.** / 1 to 2 doses if you smoke cigarettes or if you have certain conditions.  Meningococcal vaccine.** / Consult your health care provider.  Hepatitis A vaccine.** / Consult your health care provider.  Hepatitis B vaccine.** / Consult your health care provider.  Haemophilus influenzae type b (Hib) vaccine.** / Consult your health care provider. Ages 29 and over  Blood pressure check.** / Every 1 to 2 years.  Lipid and cholesterol check.**/ Every 5 years beginning at age 61.  Lung cancer screening. / Every year if you are aged 11-80 years and have a 30-pack-year history of smoking and currently smoke or have quit within the past 15 years. Yearly screening is stopped once you have quit smoking for at least 15 years or develop a health problem that would prevent you from having lung cancer treatment.  Fecal occult blood test (FOBT) of stool. / Every year beginning at age 23 and continuing until age 58. You may not have to do this test if you get a colonoscopy every 10 years.  Flexible sigmoidoscopy** or colonoscopy.** / Every 5 years for a flexible sigmoidoscopy or  every 10 years for a colonoscopy beginning at age 68 and continuing until age 74.  Hepatitis C blood test.** / For all people born from [birth year] through 1965 and any individual with known risks for hepatitis C.  Abdominal aortic aneurysm (AAA) screening.** / A one-time screening for ages 67 to 62 years who are current or former smokers.  Skin self-exam. / Monthly.  Influenza vaccine. / Every year.  Tetanus, diphtheria, and acellular pertussis (Tdap/Td) vaccine.** / 1 dose of Td every 10 years.  Varicella vaccine.** / Consult your health care provider.  Zoster vaccine.** / 1 dose for adults aged 3 years or older.  Pneumococcal 13-valent conjugate (PCV13) vaccine.** / Consult your health care provider.  Pneumococcal polysaccharide (PPSV23) vaccine.** / 1 dose for all adults aged 1 years and older.  Meningococcal vaccine.** / Consult your health care provider.  Hepatitis A vaccine.** / Consult your health care provider.  Hepatitis B vaccine.** / Consult your health care provider.  Haemophilus influenzae type b (Hib) vaccine.** / Consult your health care provider. **Family history and personal history of risk and conditions may change your health care provider's recommendations. Document Released: 02/14/2001 Document Revised: 12/24/2012 Document Reviewed: 05/16/2010 [facility] Patient Information 2015 [place], [place]. This information is not intended to replace advice given to you by your health care provider. Make sure you discuss any questions you have with your health care provider.

## 2014-09-24 NOTE — Progress Notes (Signed)
Patient ID: Zachary Rowe, male   DOB: [birth year], 59 y.o.   MRN: [id number]   Subjective:    Patient ID: Zachary Rowe, male    DOB: 1955-03-21, 59 y.o.   MRN: [id number]  Chief Complaint  Patient presents with  . Annual Exam    HPI Patient is in today for ann Denies CP/palp/SOB/HA/congestion/fevers/GI or GU c/o. Taking meds as prescribedual exam. Is generally feeling well. No recent illness or acute complaints except for some mild abdominal pain most notably in epigastrium and right upper quadrant. No change in bowels. No nausea vomiting. Does note very rare word recognition difficulties.  Past Medical History  Diagnosis Date  . Measles as a child  . Chicken pox as a child  . Chronic sinusitis 01-03-2000  . Hypertension   . Thyroid disease   . Hyperlipidemia   . Multiple allergies 09/02/2010  . History of chicken pox 09/02/2010  . History of measles 09/02/2010  . Overweight(278.02) 09/02/2010  . Elevated BP 09/02/2010  . HTN (hypertension) 09/02/2010  . 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 Fatigue 11/01/2010  . Raynaud disease 04/11/2011  . Peyronie disease 10/14/2012  . Preventative health care 11/17/2012  . Hard of hearing     wears bilateral hearing aids  . Personal history of colonic polyps - adenoma 04/17/2007    04/2007 - 3 sigmoid polyps - at least 1 tubular adenoma  . Back pain 04/02/2014    Past Surgical History  Procedure Laterality Date  . Skin biopsy      BB in forehead  . Colonoscopy      Family History  Problem Relation Age of Onset  . Hypertension Mother   . Diabetes Mother     borderline  . Neuropathy Mother   . Macular degeneration Mother   . COPD Mother     recent pneumonia  . Cancer Mother     lung, refused chemo, tolerated radiation  . Hypertension Father   . Diabetes Father     type 2  . Cancer Father 25    liver  . Diabetes Maternal Grandmother   . Parkinson's disease Maternal Grandmother   . Emphysema Maternal Grandfather   . COPD  Maternal Grandfather   . Stroke Paternal Grandfather   . Cancer Maternal Uncle     prostate cancer  . Colon cancer Neg Hx   . Esophageal cancer Neg Hx   . Rectal cancer Neg Hx   . Stomach cancer Neg Hx     Social History   Social History  . Marital Status: Married    Spouse Name: N/A  . Number of Children: N/A  . Years of Education: N/A   Occupational History  . Not on file.   Social History Main Topics  . Smoking status: Former Smoker    Types: Cigarettes    Quit date: 01/03/2007  . Smokeless tobacco: Current User    Types: Chew     Comment: dip  . Alcohol Use: 9.0 - 12.0 oz/week    15-20 Glasses of wine per week     Comment: occas drinks Scotch  . Drug Use: No  . Sexual Activity:    Partners: Female     Comment: lives with wife   Other Topics Concern  . Not on file   Social History Narrative    Outpatient Prescriptions Prior to Visit  Medication Sig Dispense Refill  . atorvastatin (LIPITOR) 10 MG tablet Take 1 tablet (10 mg total) by mouth daily.  90 tablet 2  . levothyroxine (SYNTHROID, LEVOTHROID) 50 MCG tablet Take 1 tablet (50 mcg total) by mouth daily. 90 tablet 2  . Thiamine HCl (VITAMIN B-1) 250 MG tablet Take 250 mg by mouth daily as needed.     . Azelastine HCl (ASTEPRO) 0.15 % SOLN USE TWO SPRAY AS NEEDED 30 mL 1  . lisinopril (PRINIVIL,ZESTRIL) 20 MG tablet Take 1 by mouth daily 90 tablet 3  . amitriptyline (ELAVIL) 25 MG tablet Take 1-2 tablets (25-50 mg total) by mouth at bedtime. (Patient not taking: Reported on 09/24/2014) 60 tablet 1  . folic acid (FOLVITE) 024 MCG tablet Take 400 mcg by mouth daily as needed.      No facility-administered medications prior to visit.    No Known Allergies  Review of Systems  Constitutional: Negative for fever and malaise/fatigue.  HENT: Negative for congestion.   Eyes: Negative for discharge.  Respiratory: Negative for shortness of breath.   Cardiovascular: Negative for chest pain, palpitations and leg  swelling.  Gastrointestinal: Positive for abdominal pain. Negative for nausea.  Genitourinary: Positive for urgency. Negative for dysuria.  Musculoskeletal: Negative for falls.  Skin: Negative for rash.  Neurological: Negative for loss of consciousness and headaches.  Endo/Heme/Allergies: Negative for environmental allergies.  Psychiatric/Behavioral: Negative for depression. The patient is not nervous/anxious.        Objective:    Physical Exam  BP 138/86 mmHg  Pulse 52  Temp(Src) 98.2 F (36.8 C) (Oral)  Ht 6\' 3"  (1.905 m)  Wt 229 lb 8 oz (104.101 kg)  BMI 28.69 kg/m2  SpO2 98% Wt Readings from Last 3 Encounters:  09/24/14 229 lb 8 oz (104.101 kg)  03/20/14 227 lb (102.967 kg)  07/25/13 215 lb 8 oz (97.75 kg)     Lab Results  Component Value Date   WBC 5.9 09/24/2014   HGB 15.6 09/24/2014   HCT 46.6 09/24/2014   PLT 301.0 09/24/2014   GLUCOSE 98 09/24/2014   CHOL 188 09/24/2014   TRIG 169.0* 09/24/2014   HDL 58.60 09/24/2014   LDLDIRECT 134.4 09/08/2010   LDLCALC 95 09/24/2014   ALT 33 09/24/2014   AST 24 09/24/2014   NA 138 09/24/2014   K 3.9 09/24/2014   CL 102 09/24/2014   CREATININE 1.09 09/24/2014   BUN 16 09/24/2014   CO2 28 09/24/2014   TSH 3.65 09/24/2014   PSA 0.65 03/20/2014   HGBA1C 5.6 07/25/2013    Lab Results  Component Value Date   TSH 3.65 09/24/2014   Lab Results  Component Value Date   WBC 5.9 09/24/2014   HGB 15.6 09/24/2014   HCT 46.6 09/24/2014   MCV 97.4 09/24/2014   PLT 301.0 09/24/2014   Lab Results  Component Value Date   NA 138 09/24/2014   K 3.9 09/24/2014   CO2 28 09/24/2014   GLUCOSE 98 09/24/2014   BUN 16 09/24/2014   CREATININE 1.09 09/24/2014   BILITOT 0.8 09/24/2014   ALKPHOS 52 09/24/2014   AST 24 09/24/2014   ALT 33 09/24/2014   PROT 7.8 09/24/2014   ALBUMIN 4.7 09/24/2014   CALCIUM 9.2 09/24/2014   GFR 73.38 09/24/2014   Lab Results  Component Value Date   CHOL 188 09/24/2014   Lab Results    Component Value Date   HDL 58.60 09/24/2014   Lab Results  Component Value Date   LDLCALC 95 09/24/2014   Lab Results  Component Value Date   TRIG 169.0* 09/24/2014   Lab Results  Component Value Date   CHOLHDL 3 09/24/2014   Lab Results  Component Value Date   HGBA1C 5.6 07/25/2013       Assessment & Plan:   . I have changed Mr. Kareem's lisinopril. I am also having him maintain his folic acid, vitamin B-1, amitriptyline, levothyroxine, and atorvastatin.  Meds ordered this encounter  Medications  . lisinopril (PRINIVIL,ZESTRIL) 20 MG tablet    Sig: Take 1 tablet (20 mg total) by mouth 2 (two) times daily. Take 1 by mouth daily    Dispense:  180 tablet    Refill:  3     Penni Homans, MD

## 2014-09-25 LAB — HEPATITIS PANEL, ACUTE
HCV Ab: NEGATIVE
HEP B S AG: NEGATIVE
Hep A IgM: NONREACTIVE
Hep B C IgM: NONREACTIVE

## 2014-09-25 LAB — HIV ANTIBODY (ROUTINE TESTING W REFLEX): HIV: NONREACTIVE

## 2014-09-28 ENCOUNTER — Other Ambulatory Visit: Payer: Self-pay | Admitting: Family Medicine

## 2014-09-29 ENCOUNTER — Other Ambulatory Visit: Payer: Self-pay | Admitting: Family Medicine

## 2014-09-29 ENCOUNTER — Ambulatory Visit (HOSPITAL_BASED_OUTPATIENT_CLINIC_OR_DEPARTMENT_OTHER)
Admission: RE | Admit: 2014-09-29 | Discharge: 2014-09-29 | Disposition: A | Discharge status: Home / Self Care | Payer: BLUE CROSS/BLUE SHIELD | Source: Ambulatory Visit | Attending: Family Medicine | Admitting: Family Medicine

## 2014-09-29 ENCOUNTER — Telehealth: Payer: Self-pay | Admitting: Family Medicine

## 2014-09-29 DIAGNOSIS — N281 Cyst of kidney, acquired: Secondary | ICD-10-CM | POA: Diagnosis not present

## 2014-09-29 DIAGNOSIS — E785 Hyperlipidemia, unspecified: Secondary | ICD-10-CM | POA: Insufficient documentation

## 2014-09-29 DIAGNOSIS — R932 Abnormal findings on diagnostic imaging of liver and biliary tract: Secondary | ICD-10-CM | POA: Insufficient documentation

## 2014-09-29 DIAGNOSIS — R109 Unspecified abdominal pain: Secondary | ICD-10-CM | POA: Diagnosis present

## 2014-09-29 DIAGNOSIS — F1010 Alcohol abuse, uncomplicated: Secondary | ICD-10-CM | POA: Diagnosis not present

## 2014-09-29 DIAGNOSIS — R140 Abdominal distension (gaseous): Secondary | ICD-10-CM | POA: Diagnosis not present

## 2014-09-29 NOTE — Telephone Encounter (Signed)
Dawn with [facility] Ph# [phone] Ref# [id number]  Lisinopril RX needs clarification, placed on hold until received. Sig: Take 1 tablet (20 mg total) by mouth 2 (two) times daily. Take 1 by mouth daily They need to know correct directions for use.

## 2014-09-29 NOTE — Telephone Encounter (Signed)
His lisinopril should be 20 mg tabs po bid, disp same number and refills, please give clarifying order to pharmacy

## 2014-09-29 NOTE — Telephone Encounter (Signed)
Relation to AY:TKZS Call back number:[phone]   Reason for call:  Patient stated he had a ultrasound today and they advised him to have a MRI or CT scan. Patient requesting orders. Patient would like to know when orders are placed so he can schedule an appoitment. Please advise

## 2014-09-29 NOTE — Telephone Encounter (Signed)
I have ordered CT scan

## 2014-09-29 NOTE — Telephone Encounter (Signed)
I have informed the patient of results per PCP instructions.  He does want further testing to be done, but does need to be done (scheduled) before October 09, 2014 as he is going out of town after the 7th of October and will be gone approximately 3 weeks.

## 2014-09-30 ENCOUNTER — Other Ambulatory Visit: Payer: Self-pay | Admitting: Family Medicine

## 2014-09-30 ENCOUNTER — Encounter: Payer: Self-pay | Admitting: Family Medicine

## 2014-09-30 NOTE — Telephone Encounter (Signed)
Notified pharmacist who stated understanding.

## 2014-10-02 ENCOUNTER — Encounter (HOSPITAL_BASED_OUTPATIENT_CLINIC_OR_DEPARTMENT_OTHER): Payer: Self-pay

## 2014-10-02 ENCOUNTER — Encounter: Payer: Self-pay | Admitting: Family Medicine

## 2014-10-02 ENCOUNTER — Ambulatory Visit (HOSPITAL_BASED_OUTPATIENT_CLINIC_OR_DEPARTMENT_OTHER)
Admission: RE | Admit: 2014-10-02 | Discharge: 2014-10-02 | Disposition: A | Discharge status: Home / Self Care | Payer: BLUE CROSS/BLUE SHIELD | Source: Ambulatory Visit | Attending: Family Medicine | Admitting: Family Medicine

## 2014-10-02 ENCOUNTER — Other Ambulatory Visit: Payer: Self-pay | Admitting: Family Medicine

## 2014-10-02 DIAGNOSIS — N281 Cyst of kidney, acquired: Secondary | ICD-10-CM | POA: Diagnosis not present

## 2014-10-02 DIAGNOSIS — R109 Unspecified abdominal pain: Secondary | ICD-10-CM | POA: Insufficient documentation

## 2014-10-02 DIAGNOSIS — I700 Atherosclerosis of aorta: Secondary | ICD-10-CM | POA: Diagnosis not present

## 2014-10-02 DIAGNOSIS — N289 Disorder of kidney and ureter, unspecified: Secondary | ICD-10-CM | POA: Diagnosis present

## 2014-10-02 MED ORDER — IOHEXOL 300 MG/ML  SOLN
100.0000 mL | Freq: Once | INTRAMUSCULAR | Status: AC | PRN
  Administered 2014-10-02: 100 mL via INTRAVENOUS

## 2014-10-02 NOTE — Assessment & Plan Note (Signed)
Minimize fatty and spicy foods, hydrate well. [place] of abd revealed renal lesion, renal lesion not cancerous on further imaging, no new concerns

## 2014-10-02 NOTE — Assessment & Plan Note (Signed)
Patient encouraged to maintain heart healthy diet, regular exercise, adequate sleep. Consider daily probiotics. Take medications as prescribed. Check labs 

## 2014-10-02 NOTE — Assessment & Plan Note (Signed)
Increase Lisinopril to 20 mg po bid

## 2014-10-02 NOTE — Assessment & Plan Note (Signed)
On Levothyroxine, continue to monitor 

## 2014-12-02 ENCOUNTER — Other Ambulatory Visit: Payer: Self-pay | Admitting: Family Medicine

## 2014-12-02 NOTE — Telephone Encounter (Signed)
Rx sent to the pharmacy by e-script.//AB/CMA 

## 2014-12-31 ENCOUNTER — Encounter: Payer: Self-pay | Admitting: Family Medicine

## 2014-12-31 ENCOUNTER — Ambulatory Visit (INDEPENDENT_AMBULATORY_CARE_PROVIDER_SITE_OTHER): Payer: BLUE CROSS/BLUE SHIELD | Admitting: Family Medicine

## 2014-12-31 VITALS — BP 132/86 | HR 51 | Temp 98.1°F | Resp 16 | Ht 75.0 in | Wt 232.2 lb

## 2014-12-31 DIAGNOSIS — L02222 Furuncle of back [any part, except buttock]: Secondary | ICD-10-CM

## 2014-12-31 DIAGNOSIS — L02232 Carbuncle of back [any part, except buttock]: Secondary | ICD-10-CM

## 2014-12-31 MED ORDER — VITAMIN B-1 250 MG PO TABS: 250.0000 mg | ORAL_TABLET | Freq: Every day | ORAL | Status: DC | PRN

## 2014-12-31 MED ORDER — DOXYCYCLINE HYCLATE 100 MG PO TABS: 100.0000 mg | ORAL_TABLET | Freq: Two times a day (BID) | ORAL | Status: DC

## 2014-12-31 NOTE — Assessment & Plan Note (Signed)
New.  No area to drain on exam today- no obvious pus pocket.  Start Doxy.  Encouraged hot compresses and to avoid picking or popping.  Pt to f/u in 7-10 days to assess healing vs need for I&D.  Reviewed supportive care and red flags that should prompt return.  Pt expressed understanding and is in agreement w/ plan.

## 2014-12-31 NOTE — Progress Notes (Signed)
   Subjective:    Patient ID: Zachary Rowe, male    DOB: 24-Aug-1955, 59 y.o.   MRN: [id number]  HPI Boil- pt reports he had area in center of back that 'popped' a few days prior the Christmas.  Continues to drain.  Intermittently painful.  No fevers.  Reports wife has been squeezing and picking at area daily.   Review of Systems For ROS see HPI     Objective:   Physical Exam  Constitutional: He is oriented to person, place, and time. He appears well-developed and well-nourished. No distress.  HENT:  Head: Normocephalic and atraumatic.  Neurological: He is alert and oriented to person, place, and time.  Skin: Skin is warm and dry. There is erythema (surrounding 2cm indurated and slightly fluctuant abscess in center of back).  Vitals reviewed.         Assessment & Plan:

## 2014-12-31 NOTE — Progress Notes (Signed)
Pre visit review using our clinic review tool, if applicable. No additional management support is needed unless otherwise documented below in the visit note. 

## 2014-12-31 NOTE — Patient Instructions (Signed)
Follow up in 7-10 days for a wound recheck Start the Doxycycline twice daily- take w/ food Keep area clean and covered Try and avoid picking or squeezing- this may force the infection deeper Call with any questions or concerns Hang in there!!!

## 2015-01-11 ENCOUNTER — Ambulatory Visit: Payer: BLUE CROSS/BLUE SHIELD | Admitting: Family Medicine

## 2015-01-11 ENCOUNTER — Telehealth: Payer: Self-pay | Admitting: Family Medicine

## 2015-01-11 NOTE — Telephone Encounter (Signed)
No charge.  I certainly understand!

## 2015-01-11 NOTE — Telephone Encounter (Signed)
Patient left voicemail stating he will not make his 12:15pm driveway is icey will [facility]

## 2015-01-14 ENCOUNTER — Encounter: Payer: Self-pay | Admitting: Family Medicine

## 2015-01-14 DIAGNOSIS — E039 Hypothyroidism, unspecified: Secondary | ICD-10-CM

## 2015-01-14 MED ORDER — LEVOTHYROXINE SODIUM 50 MCG PO TABS: 50.0000 ug | ORAL_TABLET | Freq: Every day | ORAL | Status: DC

## 2015-03-11 ENCOUNTER — Other Ambulatory Visit: Payer: Self-pay | Admitting: Family Medicine

## 2015-04-04 ENCOUNTER — Encounter: Payer: Self-pay | Admitting: Family Medicine

## 2015-04-13 ENCOUNTER — Other Ambulatory Visit: Payer: Self-pay | Admitting: Family Medicine

## 2015-06-02 ENCOUNTER — Encounter: Payer: Self-pay | Admitting: Family Medicine

## 2015-06-07 ENCOUNTER — Other Ambulatory Visit: Payer: Self-pay | Admitting: Family Medicine

## 2015-06-07 DIAGNOSIS — M25552 Pain in left hip: Secondary | ICD-10-CM

## 2015-06-07 NOTE — Telephone Encounter (Signed)
Dr Charlett Blake-- I pended the sports medicine referral for you to review / sign.

## 2015-06-10 ENCOUNTER — Ambulatory Visit: Payer: BLUE CROSS/BLUE SHIELD | Admitting: Family Medicine

## 2015-06-21 ENCOUNTER — Encounter: Payer: Self-pay | Admitting: Family Medicine

## 2015-06-21 ENCOUNTER — Ambulatory Visit (INDEPENDENT_AMBULATORY_CARE_PROVIDER_SITE_OTHER): Payer: BLUE CROSS/BLUE SHIELD | Admitting: Family Medicine

## 2015-06-21 VITALS — BP 130/86 | HR 49 | Ht 75.0 in | Wt 222.0 lb

## 2015-06-21 DIAGNOSIS — M76892 Other specified enthesopathies of left lower limb, excluding foot: Secondary | ICD-10-CM | POA: Insufficient documentation

## 2015-06-21 DIAGNOSIS — M65852 Other synovitis and tenosynovitis, left thigh: Secondary | ICD-10-CM

## 2015-06-21 MED ORDER — MELOXICAM 15 MG PO TABS: 15.0000 mg | ORAL_TABLET | Freq: Every day | ORAL | Status: DC

## 2015-06-21 NOTE — Progress Notes (Signed)
Pre visit review using our clinic review tool, if applicable. No additional management support is needed unless otherwise documented below in the visit note. 

## 2015-06-21 NOTE — Progress Notes (Signed)
[facility] [address] [place], [place] [address] Phone: [phone] Subjective:    I'm seeing this patient by the request  of:  Penni Homans, MD   CC: Left hip pain  [id number] Zachary Rowe is a 60 y.o. male coming in with complaint of left hip pain. Patient describes pain as more of a dull ache that seems to happen after sitting for long amount of time. It better with activity. Patient has been doing a lot of yard work and working in a gym. Patient denies any radiation down the leg, denies any numbness. Rates the severity pain is 4 out of 10 when it occurs. Has not tried any over-the-counter medications. Does not remember any true injury. No associated back pain. Denies any fevers, chills, any abnormal weight loss. No bowel or bladder incontinence. Patient is just concerned because he is going out of the country for one week and does not want have worsening pain. 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     Past Medical History  Diagnosis Date  . Measles as a child  . Chicken pox as a child  . Chronic sinusitis 01-03-2000  . Hypertension   . Thyroid disease   . Hyperlipidemia   . Multiple allergies 09/02/2010  . History of chicken pox 09/02/2010  . History of measles 09/02/2010  . Overweight(278.02) 09/02/2010  . Elevated BP 09/02/2010  . HTN (hypertension) 09/02/2010  . 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 Fatigue 11/01/2010  . Raynaud disease 04/11/2011  . Peyronie disease 10/14/2012  . Preventative health care 11/17/2012  . Hard of hearing     wears bilateral hearing aids  . Personal history of colonic polyps - adenoma 04/17/2007    04/2007 - 3 sigmoid polyps - at least 1 tubular adenoma  . Back pain 04/02/2014   Past Surgical History  Procedure Laterality Date  . Skin biopsy      BB in forehead  . Colonoscopy     Social History   Social History  . Marital  Status: Married    Spouse Name: N/A  . Number of Children: N/A  . Years of Education: N/A   Social History Main Topics  . Smoking status: Former Smoker    Types: Cigarettes    Quit date: 01/03/2007  . Smokeless tobacco: Current User    Types: Chew     Comment: dip  . Alcohol Use: 9.0 - 12.0 oz/week    15-20 Glasses of wine per week     Comment: occas drinks Scotch  . Drug Use: No  . Sexual Activity:    Partners: Female     Comment: lives with wife   Other Topics Concern  . None   Social History Narrative   No Known Allergies Family History  Problem Relation Age of Onset  . Hypertension Mother   . Diabetes Mother     borderline  . Neuropathy Mother   . Macular degeneration Mother   . COPD Mother     recent pneumonia  . Cancer Mother     lung, refused chemo, tolerated radiation  . Hypertension Father   . Diabetes Father     type 2  . Cancer Father 29    liver  . Diabetes Maternal Grandmother   . Parkinson's disease Maternal Grandmother   . Emphysema Maternal Grandfather   . COPD Maternal Grandfather   .  Stroke Paternal Grandfather   . Cancer Maternal Uncle     prostate cancer  . Colon cancer Neg Hx   . Esophageal cancer Neg Hx   . Rectal cancer Neg Hx   . Stomach cancer Neg Hx     Past medical history, social, surgical and family history all reviewed in electronic medical record.  No pertanent information unless stated regarding to the chief complaint.   Review of Systems: No headache, visual changes, nausea, vomiting, diarrhea, constipation, dizziness, abdominal pain, skin rash, fevers, chills, night sweats, weight loss, swollen lymph nodes, body aches, joint swelling, muscle aches, chest pain, shortness of breath, mood changes.   Objective Blood pressure 130/86, pulse 49, height 6\' 3"  (1.905 m), weight 222 lb (100.699 kg), SpO2 97 %.  General: No apparent distress alert and oriented x3 mood and affect normal, dressed appropriately.  HEENT: Pupils equal,  extraocular movements intact  Respiratory: Patient's speak in full sentences and does not appear short of breath  Cardiovascular: No lower extremity edema, non tender, no erythema  Skin: Warm dry intact with no signs of infection or rash on extremities or on axial skeleton.  Abdomen: Soft nontender  Neuro: Cranial nerves II through XII are intact, neurovascularly intact in all extremities with 2+ DTRs and 2+ pulses.  Lymph: No lymphadenopathy of posterior or anterior cervical chain or axillae bilaterally.  Gait normal with good balance and coordination.  MSK:  Non tender with full range of motion and good stability and symmetric strength and tone of shoulders, elbows, wrist, knee and ankles bilaterally.  Hip: Left ROM IR: 15 Deg, ER: 45 Deg, Flexion: 120 Deg, Extension: 100 Deg, Abduction: 45 Deg, Adduction: 25 Deg Strength IR: 5/5, ER: 5/5, Flexion: 5/5, Extension: 5/5, Abduction: 5/5, Adduction: 5/5 Pelvic alignment unremarkable to inspection and palpation. Standing hip rotation and gait without trendelenburg sign / unsteadiness. Greater trochanter without tenderness to palpation. No tenderness over piriformis and greater trochanter. No pain with FABER or FADIR. No SI joint tenderness and normal minimal SI movement. Very mild tightness of the left hip flexor compared to the contralateral side. Contralateral hip unremarkable.  Procedure note.  [id number]; 15 minutes spent for Therapeutic exercises as stated in above notes.  This included exercises focusing on stretching, strengthening, with significant focus on eccentric aspects.  Hip strengthening exercises which included:  Pelvic tilt/bracing to help with proper recruitment of the lower abs and pelvic floor muscles  Glute strengthening to properly contract glutes without over-engaging low back and hamstrings - prone hip extension and glute bridge exercises Proper stretching techniques to increase effectiveness for the hip flexors, groin,  quads, piriformic and low back when appropriate  Proper technique shown and discussed handout in great detail with ATC.  All questions were discussed and answered.    Impression and Recommendations:     This case required medical decision making of moderate complexity.      Note: This dictation was prepared with Dragon dictation along with smaller phrase technology. Any transcriptional errors that result from this process are unintentional.

## 2015-06-21 NOTE — Assessment & Plan Note (Signed)
I believe the patient's symptoms correspond more with her left hip flexor tendinitis. We discussed with patient at great length. We discussed icing regimen, we discussed oral anti-inflammatories emerged patient given. Patient will decrease if any worsening stomach pain. We discussed home exercises and patient work with Product/process development scientist. We discussed proper shoes. Stretching after activities. Follow-up again in 4 weeks. Worsening symptoms x-rays would be warranted. Also possible formal physical therapy. Differential includes arthritis but I think will likelihood with full range of motion and no pain with internal range of motion., Lumbar radiculopathy which is low likelihood but no significant neck pain, or possible tensor fascia lata or bursitis. We will see how patient is doing at follow-up in 4 weeks.

## 2015-06-21 NOTE — Patient Instructions (Signed)
Great to meet you  Ice the area after exercise or a lot of activity to avoid any inflammation  Meloxicam daily for 10 days then as needed.  Exercises 3 times a week. Do some of the stretching after working out.  I think you will do well overall.  See me again in 4 weeks if not perfect  Have a great trip!

## 2015-06-25 ENCOUNTER — Other Ambulatory Visit: Payer: Self-pay | Admitting: Family Medicine

## 2015-07-12 ENCOUNTER — Encounter: Payer: Self-pay | Admitting: Family Medicine

## 2015-07-14 ENCOUNTER — Ambulatory Visit: Payer: BLUE CROSS/BLUE SHIELD | Admitting: Family Medicine

## 2015-07-26 ENCOUNTER — Other Ambulatory Visit: Payer: Self-pay | Admitting: Family Medicine

## 2015-10-04 ENCOUNTER — Other Ambulatory Visit: Payer: Self-pay | Admitting: Family Medicine

## 2015-12-20 ENCOUNTER — Other Ambulatory Visit: Payer: Self-pay | Admitting: Family Medicine

## 2015-12-28 ENCOUNTER — Other Ambulatory Visit: Payer: Self-pay | Admitting: Family Medicine

## 2016-01-31 ENCOUNTER — Other Ambulatory Visit: Payer: Self-pay | Admitting: Family Medicine

## 2016-01-31 NOTE — Telephone Encounter (Signed)
Patient needs appointment. Last appt 12/31/2014  thx PC

## 2016-02-09 NOTE — Telephone Encounter (Signed)
Patient scheduled physical with PCP for 02/11/2016

## 2016-02-11 ENCOUNTER — Encounter: Payer: BLUE CROSS/BLUE SHIELD | Admitting: Family Medicine

## 2016-02-11 ENCOUNTER — Telehealth: Payer: Self-pay | Admitting: Family Medicine

## 2016-02-11 NOTE — Telephone Encounter (Signed)
No charge. 

## 2016-02-11 NOTE — Telephone Encounter (Signed)
Patient cancelled 10:30am appointment today due to car trouble, physical [facility] to 05/04/16, charge or no charge

## 2016-03-20 ENCOUNTER — Other Ambulatory Visit: Payer: Self-pay | Admitting: Family Medicine

## 2016-03-20 MED ORDER — ATORVASTATIN CALCIUM 10 MG PO TABS: 10.0000 mg | ORAL_TABLET | Freq: Every day | ORAL | 0 refills | Status: DC

## 2016-03-20 MED ORDER — LEVOTHYROXINE SODIUM 50 MCG PO TABS: 50.0000 ug | ORAL_TABLET | Freq: Every day | ORAL | 0 refills | Status: DC

## 2016-03-20 MED ORDER — LISINOPRIL 20 MG PO TABS: 20.0000 mg | ORAL_TABLET | Freq: Two times a day (BID) | ORAL | 0 refills | Status: DC

## 2016-05-04 ENCOUNTER — Encounter: Payer: Self-pay | Admitting: Family Medicine

## 2016-05-04 ENCOUNTER — Ambulatory Visit (INDEPENDENT_AMBULATORY_CARE_PROVIDER_SITE_OTHER): Payer: BLUE CROSS/BLUE SHIELD | Admitting: Family Medicine

## 2016-05-04 VITALS — BP 184/84 | HR 51 | Temp 97.9°F | Resp 18 | Ht 75.0 in | Wt 228.2 lb

## 2016-05-04 DIAGNOSIS — R001 Bradycardia, unspecified: Secondary | ICD-10-CM

## 2016-05-04 DIAGNOSIS — I10 Essential (primary) hypertension: Secondary | ICD-10-CM

## 2016-05-04 DIAGNOSIS — Z125 Encounter for screening for malignant neoplasm of prostate: Secondary | ICD-10-CM | POA: Diagnosis not present

## 2016-05-04 DIAGNOSIS — R5383 Other fatigue: Secondary | ICD-10-CM

## 2016-05-04 DIAGNOSIS — Z0000 Encounter for general adult medical examination without abnormal findings: Secondary | ICD-10-CM

## 2016-05-04 DIAGNOSIS — E079 Disorder of thyroid, unspecified: Secondary | ICD-10-CM

## 2016-05-04 DIAGNOSIS — E663 Overweight: Secondary | ICD-10-CM

## 2016-05-04 DIAGNOSIS — Z23 Encounter for immunization: Secondary | ICD-10-CM | POA: Diagnosis not present

## 2016-05-04 DIAGNOSIS — E785 Hyperlipidemia, unspecified: Secondary | ICD-10-CM | POA: Diagnosis not present

## 2016-05-04 DIAGNOSIS — F418 Other specified anxiety disorders: Secondary | ICD-10-CM

## 2016-05-04 LAB — LIPID PANEL
CHOL/HDL RATIO: 3
CHOLESTEROL: 188 mg/dL (ref 0–200)
HDL: 67.8 mg/dL (ref >39.00)
LDL CALC: 105 mg/dL — AB (ref 0–99)
NonHDL: 120.29
TRIGLYCERIDES: 78 mg/dL (ref 0.0–149.0)
VLDL: 15.6 mg/dL (ref 0.0–40.0)

## 2016-05-04 LAB — CBC
HEMATOCRIT: 43.9 % (ref 39.0–52.0)
HEMOGLOBIN: 14.9 g/dL (ref 13.0–17.0)
MCHC: 34 g/dL (ref 30.0–36.0)
MCV: 96.8 fl (ref 78.0–100.0)
PLATELETS: 256 10*3/uL (ref 150.0–400.0)
RBC: 4.53 Mil/uL (ref 4.22–5.81)
RDW: 13.6 % (ref 11.5–15.5)
WBC: 5.1 10*3/uL (ref 4.0–10.5)

## 2016-05-04 LAB — TSH: TSH: 5.44 u[IU]/mL — ABNORMAL HIGH (ref 0.35–4.50)

## 2016-05-04 LAB — COMPREHENSIVE METABOLIC PANEL
ALBUMIN: 4.6 g/dL (ref 3.5–5.2)
ALT: 42 U/L (ref 0–53)
AST: 31 U/L (ref 0–37)
Alkaline Phosphatase: 43 U/L (ref 39–117)
BUN: 15 mg/dL (ref 6–23)
CALCIUM: 9.6 mg/dL (ref 8.4–10.5)
CHLORIDE: 102 meq/L (ref 96–112)
CO2: 29 meq/L (ref 19–32)
Creatinine, Ser: 1.14 mg/dL (ref 0.40–1.50)
GFR: 69.31 mL/min (ref >60.00)
Glucose, Bld: 109 mg/dL — ABNORMAL HIGH (ref 70–99)
POTASSIUM: 4.9 meq/L (ref 3.5–5.1)
Sodium: 137 mEq/L (ref 135–145)
Total Bilirubin: 1.3 mg/dL — ABNORMAL HIGH (ref 0.2–1.2)
Total Protein: 7.1 g/dL (ref 6.0–8.3)

## 2016-05-04 LAB — PSA: PSA: 0.38 ng/mL (ref 0.10–4.00)

## 2016-05-04 MED ORDER — VITAMIN B-1 250 MG PO TABS: 250.0000 mg | ORAL_TABLET | Freq: Every day | ORAL | 6 refills | Status: DC | PRN

## 2016-05-04 MED ORDER — FOLIC ACID 400 MCG PO TABS: 400.0000 ug | ORAL_TABLET | Freq: Every day | ORAL | 3 refills | Status: DC

## 2016-05-04 MED ORDER — HYDROCHLOROTHIAZIDE 25 MG PO TABS: 25.0000 mg | ORAL_TABLET | Freq: Every day | ORAL | 3 refills | Status: DC

## 2016-05-04 NOTE — Assessment & Plan Note (Addendum)
Taking Lisinopril twice daily will add hctz 25 mg po qhs, increase Potassium in diet, check bp and cmp in 2 weeks refer to cardiology for further consideration

## 2016-05-04 NOTE — Progress Notes (Signed)
Pre visit review using our clinic review tool, if applicable. No additional management support is needed unless otherwise documented below in the visit note. 

## 2016-05-04 NOTE — Assessment & Plan Note (Signed)
Tolerating statin, encouraged heart healthy diet, avoid trans fats, minimize simple carbs and saturated fats. Increase exercise as tolerated 

## 2016-05-04 NOTE — Progress Notes (Signed)
Subjective:  I acted as a Education administrator for Dr. Charlett Blake. [place], [place]   Patient ID: Zachary Rowe, male    DOB: July 14, 1955, 61 y.o.   MRN: [id number]  Chief Complaint  Patient presents with  . Annual Exam  . Hypertension  . Hyperlipidemia    HPI  Patient is in today for an annual exam. Following up on hypertension, hyperlipidemia and other medical conditions. Patient c/o having bad dreams over the past 2 years. Patient states he drinks during the week and feels as though that may play a part in his bad dreams. He was advised to stop drinking all together. His blood pressure is elevated today, denies chest pain and  Headaches. No recent febrile illness or hospitalizations. No hallucinations. Denies CP/palp/SOB/HA/congestion/fevers/GI or GU c/o. Taking meds as prescribed  Patient Care Team: Mosie Lukes, MD as PCP - General (Family Medicine) Gatha Mayer, MD as Consulting Physician (Gastroenterology) Melina Schools, OD as Consulting Physician (Optometry)   Past Medical History:  Diagnosis Date  . 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 Back pain 04/02/2014  . Chicken pox as a child  . Chronic sinusitis 01-03-2000  . Depression with anxiety 05/07/2016  . Elevated BP 09/02/2010  . Fatigue 11/01/2010  . Hard of hearing    wears bilateral hearing aids  . History of chicken pox 09/02/2010  . History of measles 09/02/2010  . HTN (hypertension) 09/02/2010  . Hyperlipidemia   . Hypertension   . Measles as a child  . Multiple allergies 09/02/2010  . Overweight(278.02) 09/02/2010  . Personal history of colonic polyps - adenoma 04/17/2007   04/2007 - 3 sigmoid polyps - at least 1 tubular adenoma  . Peyronie disease 10/14/2012  . Preventative health care 11/17/2012  . Raynaud disease 04/11/2011  . Thyroid disease     Past Surgical History:  Procedure Laterality Date  . COLONOSCOPY    . SKIN BIOPSY     BB in forehead    Family History  Problem Relation Age of Onset  . Hypertension Mother    . Diabetes Mother     borderline  . Neuropathy Mother   . Macular degeneration Mother   . COPD Mother     recent pneumonia  . Cancer Mother     lung, refused chemo, tolerated radiation  . Hypertension Father   . Diabetes Father     type 2  . Cancer Father 29    liver  . Diabetes Maternal Grandmother   . Parkinson's disease Maternal Grandmother   . Emphysema Maternal Grandfather   . COPD Maternal Grandfather   . Stroke Paternal Grandfather   . Cancer Maternal Uncle     prostate cancer  . Colon cancer Neg Hx   . Esophageal cancer Neg Hx   . Rectal cancer Neg Hx   . Stomach cancer Neg Hx     Social History   Social History  . Marital status: Married    Spouse name: N/A  . Number of children: N/A  . Years of education: N/A   Occupational History  . Not on file.   Social History Main Topics  . Smoking status: Former Smoker    Types: Cigarettes    Quit date: 01/03/2007  . Smokeless tobacco: Current User    Types: Chew     Comment: dip  . Alcohol use 9.0 - 12.0 oz/week    15 - 20 Glasses of wine per week     Comment: occas drinks Scotch  .  Drug use: No  . Sexual activity: Yes    Partners: Female     Comment: lives with wife   Other Topics Concern  . Not on file   Social History Narrative  . No narrative on file    Outpatient Medications Prior to Visit  Medication Sig Dispense Refill  . atorvastatin (LIPITOR) 10 MG tablet Take 1 tablet (10 mg total) by mouth daily. 90 tablet 0  . Azelastine HCl 0.15 % SOLN USE TWO SPRAY(S) NASALLY ONCE DAILY AS NEEDED 30 mL 1  . lisinopril (PRINIVIL,ZESTRIL) 20 MG tablet Take 1 tablet (20 mg total) by mouth 2 (two) times daily. 884 tablet 0  . folic acid (FOLVITE) 166 MCG tablet Take 400 mcg by mouth daily.    Marland Kitchen levothyroxine (SYNTHROID, LEVOTHROID) 50 MCG tablet Take 1 tablet (50 mcg total) by mouth daily before breakfast. 90 tablet 0  . meloxicam (MOBIC) 15 MG tablet Take 1 tablet (15 mg total) by mouth daily. 30 tablet 0    . Thiamine HCl (VITAMIN B-1) 250 MG tablet Take 1 tablet (250 mg total) by mouth daily as needed. 30 tablet 6   No facility-administered medications prior to visit.     No Known Allergies  Review of Systems  Constitutional: Positive for malaise/fatigue. Negative for fever.  HENT: Negative for congestion.   Eyes: Negative for blurred vision.  Respiratory: Negative for cough and shortness of breath.   Cardiovascular: Negative for chest pain, palpitations and leg swelling.  Gastrointestinal: Negative for vomiting.  Musculoskeletal: Negative for back pain.  Skin: Negative for rash.  Neurological: Negative for loss of consciousness and headaches.  Psychiatric/Behavioral: Positive for depression. Negative for suicidal ideas. The patient is nervous/anxious.        Objective:    Physical Exam  Constitutional: He is oriented to person, place, and time. He appears well-developed and well-nourished. No distress.  HENT:  Head: Normocephalic and atraumatic.  Eyes: Conjunctivae are normal.  Neck: Normal range of motion. No thyromegaly present.  Cardiovascular: Normal rate and regular rhythm.   Pulmonary/Chest: Effort normal and breath sounds normal. He has no wheezes.  Abdominal: Soft. Bowel sounds are normal. There is no tenderness.  Musculoskeletal: Normal range of motion. He exhibits no edema or deformity.  Neurological: He is alert and oriented to person, place, and time.  Skin: Skin is warm and dry. He is not diaphoretic.  Psychiatric: He has a normal mood and affect.    BP (!) 184/84 (BP Location: Left Arm, Patient Position: Sitting, Cuff Size: Normal)   Pulse (!) 51   Temp 97.9 F (36.6 C) (Oral)   Resp 18   Ht 6\' 3"  (1.905 m)   Wt 228 lb 3.2 oz (103.5 kg)   SpO2 98%   BMI 28.52 kg/m  Wt Readings from Last 3 Encounters:  05/04/16 228 lb 3.2 oz (103.5 kg)  06/21/15 222 lb (100.7 kg)  12/31/14 232 lb 4 oz (105.3 kg)   BP Readings from Last 3 Encounters:  05/04/16 (!)  184/84  06/21/15 130/86  12/31/14 132/86     Immunization History  Administered Date(s) Administered  . Tdap 05/04/2016    Health Maintenance  Topic Date Due  . INFLUENZA VACCINE  08/02/2016  . COLONOSCOPY  03/07/2018  . TETANUS/TDAP  05/05/2026  . Hepatitis C Screening  Completed  . HIV Screening  Completed    Lab Results  Component Value Date   WBC 5.1 05/04/2016   HGB 14.9 05/04/2016   HCT 43.9  05/04/2016   PLT 256.0 05/04/2016   GLUCOSE 109 (H) 05/04/2016   CHOL 188 05/04/2016   TRIG 78.0 05/04/2016   HDL 67.80 05/04/2016   LDLDIRECT 134.4 09/08/2010   LDLCALC 105 (H) 05/04/2016   ALT 42 05/04/2016   AST 31 05/04/2016   NA 137 05/04/2016   K 4.9 05/04/2016   CL 102 05/04/2016   CREATININE 1.14 05/04/2016   BUN 15 05/04/2016   CO2 29 05/04/2016   TSH 5.44 (H) 05/04/2016   PSA 0.38 05/04/2016   HGBA1C 5.6 07/25/2013    Lab Results  Component Value Date   TSH 5.44 (H) 05/04/2016   Lab Results  Component Value Date   WBC 5.1 05/04/2016   HGB 14.9 05/04/2016   HCT 43.9 05/04/2016   MCV 96.8 05/04/2016   PLT 256.0 05/04/2016   Lab Results  Component Value Date   NA 137 05/04/2016   K 4.9 05/04/2016   CO2 29 05/04/2016   GLUCOSE 109 (H) 05/04/2016   BUN 15 05/04/2016   CREATININE 1.14 05/04/2016   BILITOT 1.3 (H) 05/04/2016   ALKPHOS 43 05/04/2016   AST 31 05/04/2016   ALT 42 05/04/2016   PROT 7.1 05/04/2016   ALBUMIN 4.6 05/04/2016   CALCIUM 9.6 05/04/2016   GFR 69.31 05/04/2016   Lab Results  Component Value Date   CHOL 188 05/04/2016   Lab Results  Component Value Date   HDL 67.80 05/04/2016   Lab Results  Component Value Date   LDLCALC 105 (H) 05/04/2016   Lab Results  Component Value Date   TRIG 78.0 05/04/2016   Lab Results  Component Value Date   CHOLHDL 3 05/04/2016   Lab Results  Component Value Date   HGBA1C 5.6 07/25/2013         Assessment & Plan:   Problem List Items Addressed This Visit    Thyroid  disease    On Levothyroxine, continue to monitor      Relevant Orders   TSH (Completed)   Ambulatory referral to Cardiology   Overweight    Encouraged DASH diet, decrease po intake and increase exercise as tolerated. Needs 7-8 hours of sleep nightly. Avoid trans fats, eat small, frequent meals every 4-5 hours with lean proteins, complex carbs and healthy fats. Minimize simple      HTN (hypertension) - Primary    Taking Lisinopril twice daily will add hctz 25 mg po qhs, increase Potassium in diet, check bp and cmp in 2 weeks refer to cardiology for further consideration      Relevant Medications   hydrochlorothiazide (HYDRODIURIL) 25 MG tablet   Other Relevant Orders   CBC (Completed)   Comprehensive metabolic panel (Completed)   Ambulatory referral to Cardiology   Hyperlipidemia    Tolerating statin, encouraged heart healthy diet, avoid trans fats, minimize simple carbs and saturated fats. Increase exercise as tolerated      Relevant Medications   hydrochlorothiazide (HYDRODIURIL) 25 MG tablet   Other Relevant Orders   Lipid panel (Completed)   Ambulatory referral to Cardiology   Fatigue   Relevant Orders   Ambulatory referral to Cardiology   Preventative health care    Patient encouraged to maintain heart healthy diet, regular exercise, adequate sleep. Consider daily probiotics. Take medications as prescribed      Depression with anxiety    [facility] some worsening in night[facility] recently. He is encouraged to follow up with his psychiatrist.  Other Visit Diagnoses    Prostate cancer screening       Relevant Orders   PSA (Completed)   Bradycardia       Relevant Orders   EKG 12-Lead (Completed)      I have discontinued Mr. Tomaszewski's meloxicam. I have also changed his folic acid. Additionally, I am having him start on hydrochlorothiazide. Lastly, I am having him maintain his Azelastine HCl, lisinopril, atorvastatin, and vitamin  B-1.  Meds ordered this encounter  Medications  . Thiamine HCl (VITAMIN B-1) 250 MG tablet    Sig: Take 1 tablet (250 mg total) by mouth daily as needed.    Dispense:  30 tablet    Refill:  6  . folic acid (FOLVITE) 122 MCG tablet    Sig: Take 1 tablet (400 mcg total) by mouth daily.    Dispense:  30 tablet    Refill:  3  . hydrochlorothiazide (HYDRODIURIL) 25 MG tablet    Sig: Take 1 tablet (25 mg total) by mouth daily.    Dispense:  90 tablet    Refill:  3    CMA served as scribe during this visit. History, Physical and Plan performed by medical provider. Documentation and orders reviewed and attested to.  Penni Homans, MD

## 2016-05-04 NOTE — Assessment & Plan Note (Signed)
On Levothyroxine, continue to monitor 

## 2016-05-04 NOTE — Assessment & Plan Note (Signed)
Patient encouraged to maintain heart healthy diet, regular exercise, adequate sleep. Consider daily probiotics. Take medications as prescribed 

## 2016-05-04 NOTE — Patient Instructions (Signed)
Shingrix Vaccine( please call your insurance company and see if they will pay for it)    Preventive Care 40-64 Years, Male Preventive care refers to lifestyle choices and visits with your health care provider that can promote health and wellness. What does preventive care include?  A yearly physical exam. This is also called an annual well check.  Dental exams once or twice a year.  Routine eye exams. Ask your health care provider how often you should have your eyes checked.  Personal lifestyle choices, including:  Daily care of your teeth and gums.  Regular physical activity.  Eating a healthy diet.  Avoiding tobacco and drug use.  Limiting alcohol use.  Practicing safe sex.  Taking low-dose aspirin every day starting at age 90. What happens during an annual well check? The services and screenings done by your health care provider during your annual well check will depend on your age, overall health, lifestyle risk factors, and family history of disease. Counseling  Your health care provider may ask you questions about your:  Alcohol use.  Tobacco use.  Drug use.  Emotional well-being.  Home and relationship well-being.  Sexual activity.  Eating habits.  Work and work Statistician. Screening  You may have the following tests or measurements:  Height, weight, and BMI.  Blood pressure.  Lipid and cholesterol levels. These may be checked every 5 years, or more frequently if you are over 86 years old.  Skin check.  Lung cancer screening. You may have this screening every year starting at age 28 if you have a 30-pack-year history of smoking and currently smoke or have quit within the past 15 years.  Fecal occult blood test (FOBT) of the stool. You may have this test every year starting at age 30.  Flexible sigmoidoscopy or colonoscopy. You may have a sigmoidoscopy every 5 years or a colonoscopy every 10 years starting at age 49.  Prostate cancer screening.  Recommendations will vary depending on your family history and other risks.  Hepatitis C blood test.  Hepatitis B blood test.  Sexually transmitted disease (STD) testing.  Diabetes screening. This is done by checking your blood sugar (glucose) after you have not eaten for a while (fasting). You may have this done every 1-3 years. Discuss your test results, treatment options, and if necessary, the need for more tests with your health care provider. Vaccines  Your health care provider may recommend certain vaccines, such as:  Influenza vaccine. This is recommended every year.  Tetanus, diphtheria, and acellular pertussis (Tdap, Td) vaccine. You may need a Td booster every 10 years.  Varicella vaccine. You may need this if you have not been vaccinated.  Zoster vaccine. You may need this after age 78.  Measles, mumps, and rubella (MMR) vaccine. You may need at least one dose of MMR if you were born in 1957 or later. You may also need a second dose.  Pneumococcal 13-valent conjugate (PCV13) vaccine. You may need this if you have certain conditions and have not been vaccinated.  Pneumococcal polysaccharide (PPSV23) vaccine. You may need one or two doses if you smoke cigarettes or if you have certain conditions.  Meningococcal vaccine. You may need this if you have certain conditions.  Hepatitis A vaccine. You may need this if you have certain conditions or if you travel or work in places where you may be exposed to hepatitis A.  Hepatitis B vaccine. You may need this if you have certain conditions or if you travel  or work in places where you may be exposed to hepatitis B.  Haemophilus influenzae type b (Hib) vaccine. You may need this if you have certain risk factors. Talk to your health care provider about which screenings and vaccines you need and how often you need them. This information is not intended to replace advice given to you by your health care provider. Make sure you discuss  any questions you have with your health care provider. Document Released: 01/15/2015 Document Revised: 09/08/2015 Document Reviewed: 10/20/2014 Elsevier Interactive Patient Education  2017 [employer].

## 2016-05-05 ENCOUNTER — Other Ambulatory Visit: Payer: Self-pay | Admitting: Family Medicine

## 2016-05-05 MED ORDER — LEVOTHYROXINE SODIUM 75 MCG PO TABS: 75.0000 ug | ORAL_TABLET | Freq: Every day | ORAL | 1 refills | Status: DC

## 2016-05-07 ENCOUNTER — Encounter: Payer: Self-pay | Admitting: Family Medicine

## 2016-05-07 DIAGNOSIS — F418 Other specified anxiety disorders: Secondary | ICD-10-CM

## 2016-05-07 HISTORY — DX: Other specified anxiety disorders: F41.8

## 2016-05-07 NOTE — Assessment & Plan Note (Signed)
Encouraged DASH diet, decrease po intake and increase exercise as tolerated. Needs 7-8 hours of sleep nightly. Avoid trans fats, eat small, frequent meals every 4-5 hours with lean proteins, complex carbs and healthy fats. Minimize simple

## 2016-05-07 NOTE — Assessment & Plan Note (Signed)
[facility] and [facility] some worsening in nightmares recently. He is encouraged to follow up with his psychiatrist.

## 2016-05-13 ENCOUNTER — Other Ambulatory Visit: Payer: Self-pay | Admitting: Family Medicine

## 2016-06-02 ENCOUNTER — Ambulatory Visit (INDEPENDENT_AMBULATORY_CARE_PROVIDER_SITE_OTHER): Payer: BLUE CROSS/BLUE SHIELD | Admitting: Cardiovascular Disease

## 2016-06-02 ENCOUNTER — Encounter: Payer: Self-pay | Admitting: Cardiovascular Disease

## 2016-06-02 VITALS — BP 130/78 | HR 52 | Ht 75.0 in | Wt 226.0 lb

## 2016-06-02 DIAGNOSIS — I10 Essential (primary) hypertension: Secondary | ICD-10-CM

## 2016-06-02 DIAGNOSIS — E7800 Pure hypercholesterolemia, unspecified: Secondary | ICD-10-CM | POA: Diagnosis not present

## 2016-06-02 DIAGNOSIS — R0602 Shortness of breath: Secondary | ICD-10-CM | POA: Diagnosis not present

## 2016-06-02 MED ORDER — AMLODIPINE BESYLATE 5 MG PO TABS: 5.0000 mg | ORAL_TABLET | Freq: Every day | ORAL | 5 refills | Status: DC

## 2016-06-02 NOTE — Patient Instructions (Signed)
Medication Instructions:  START AMLODIPINE 5 MG DAILY   Labwork: NONE  Testing/Procedures: Your physician has requested that you have an exercise tolerance test. For further information please visit [email or web address]. Please also follow instruction sheet, as given. CALL THE OFFICE AT [phone] WHEN YOU ARE READY TO SCHEDULE  Follow-Up: Your physician recommends that you schedule a follow-up appointment in: [place] wants you to follow-up in: [place] will receive a reminder letter in the mail two months in advance. If you don't receive a letter, please call our office to schedule the follow-up appointment.  If you need a refill on your cardiac medications before your next appointment, please call your pharmacy.   Exercise Stress Electrocardiogram An exercise stress electrocardiogram is a test that is done to evaluate the blood supply to your heart. This test may also be called exercise stress electrocardiography. The test is done while you are walking on a treadmill. The goal of this test is to raise your heart rate. This test is done to find areas of poor blood flow to the heart by determining the extent of coronary artery disease (CAD). CAD is defined as narrowing in one or more heart (coronary) arteries of more than 70%. If you have an abnormal test result, this may mean that you are not getting adequate blood flow to your heart during exercise. Additional testing may be needed to understand why your test was abnormal. Tell a health care provider about:  Any allergies you have.  All medicines you are taking, including vitamins, herbs, eye drops, creams, and over-the-counter medicines.  Any problems you or family members have had with anesthetic medicines.  Any blood disorders you have.  Any surgeries you have had.  Any medical conditions you have.  Possibility of pregnancy, if this applies. What are the risks? Generally, this is a safe  procedure. However, as with any procedure, complications can occur. Possible complications can include:  Pain or pressure in the following areas: ? Chest. ? Jaw or neck. ? Between your shoulder blades. ? Radiating down your left arm.  Dizziness or light-headedness.  Shortness of breath.  Increased or irregular heartbeats.  Nausea or vomiting.  Heart attack (rare).  What happens before the procedure?  Avoid all forms of caffeine 24 hours before your test or as directed by your health care provider. This includes coffee, tea (even decaffeinated tea), caffeinated sodas, chocolate, cocoa, and certain pain medicines.  Follow your health care provider's instructions regarding eating and drinking before the test.  Take your medicines as directed at regular times with water unless instructed otherwise. Exceptions may include: ? If you have diabetes, ask how you are to take your insulin or pills. It is common to adjust insulin dosing the morning of the test. ? If you are taking beta-blocker medicines, it is important to talk to your health care provider about these medicines well before the date of your test. Taking beta-blocker medicines may interfere with the test. In some cases, these medicines need to be changed or stopped 24 hours or more before the test. ? If you wear a nitroglycerin patch, it may need to be removed prior to the test. Ask your health care provider if the patch should be removed before the test.  If you use an inhaler for any breathing condition, bring it with you to the test.  If you are an outpatient, bring a snack so you can eat right after the stress phase of  the test.  Do not smoke for 4 hours prior to the test or as directed by your health care provider.  Do not apply lotions, powders, creams, or oils on your chest prior to the test.  Wear loose-fitting clothes and comfortable shoes for the test. This test involves walking on a treadmill. What happens during  the procedure?  Multiple patches (electrodes) will be put on your chest. If needed, small areas of your chest may have to be shaved to get better contact with the electrodes. Once the electrodes are attached to your body, multiple wires will be attached to the electrodes and your heart rate will be monitored.  Your heart will be monitored both at rest and while exercising.  You will walk on a treadmill. The treadmill will be started at a slow pace. The treadmill speed and incline will gradually be increased to raise your heart rate. What happens after the procedure?  Your heart rate and blood pressure will be monitored after the test.  You may return to your normal schedule including diet, activities, and medicines, unless your health care provider tells you otherwise. This information is not intended to replace advice given to you by your health care provider. Make sure you discuss any questions you have with your health care provider. Document Released: 12/17/1999 Document Revised: 05/27/2015 Document Reviewed: 08/26/2012 Elsevier Interactive Patient Education  2017 [employer].

## 2016-06-02 NOTE — Progress Notes (Signed)
Cardiology Office Note   Date:  06/02/2016   ID:  Zachary Rowe, DOB 10/31/1955, MRN [id number]  PCP:  Zachary Lukes, MD  Cardiologist:   Zachary Latch, MD   Chief Complaint  Patient presents with  . New Evaluation    HTN, low pulse      History of Present Illness: Zachary Rowe is a 61 y.o. male with hypertension, hyperlipidemia, and hypothyroidism who is being seen today for the evaluation of hypertension and fatigue at the request of Zachary Lukes, MD.  He recently saw Zachary Rowe on 5/3 and his BP was 184/84.  HCTZ was added to his regimen and he was referred to cardiology for further management.  He brings a log of his BP that has ranged from 130-162/64-90.  His heart rate ranges from 41-51.  He has been taking his medication as prescribed. He notes that for the last 6 months it has been running higher. He was previously on a stable dose of lisinopril since 2009. Zachary Rowe does not get any formal exercise but does a lot of yard work. He has no chest pain but does note some shortness of breath lately. He also notes that he gets short of breath when walking up hills. This is been ongoing for about the last several months. He denies lower extremity edema, orthopnea, or PND. He denies palpitations, lightheadedness, or dizziness. He notes that he gets more fatigued when he is exerting himself. After working for a couple hours he has to stop, which is new for him.  Past Medical History:  Diagnosis Date  . 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 Back pain 04/02/2014  . Chicken pox as a child  . Chronic sinusitis 01-03-2000  . Depression with anxiety 05/07/2016  . Elevated BP 09/02/2010  . Fatigue 11/01/2010  . Hard of hearing    wears bilateral hearing aids  . History of chicken pox 09/02/2010  . History of measles 09/02/2010  . HTN (hypertension) 09/02/2010  . Hyperlipidemia   . Hypertension   . Measles as a child  . Multiple allergies 09/02/2010  . Overweight(278.02) 09/02/2010    . Personal history of colonic polyps - adenoma 04/17/2007   04/2007 - 3 sigmoid polyps - at least 1 tubular adenoma  . Peyronie disease 10/14/2012  . Preventative health care 11/17/2012  . Raynaud disease 04/11/2011  . Thyroid disease     Past Surgical History:  Procedure Laterality Date  . COLONOSCOPY    . SKIN BIOPSY     BB in forehead     Current Outpatient Prescriptions  Medication Sig Dispense Refill  . amLODipine (NORVASC) 5 MG tablet Take 1 tablet (5 mg total) by mouth daily. 30 tablet 5  . atorvastatin (LIPITOR) 10 MG tablet TAKE 1 TABLET BY MOUTH  DAILY 90 tablet 0  . Azelastine HCl 0.15 % SOLN USE TWO SPRAY(S) NASALLY ONCE DAILY AS NEEDED 30 mL 1  . folic acid (FOLVITE) 332 MCG tablet Take 1 tablet (400 mcg total) by mouth daily. 30 tablet 3  . hydrochlorothiazide (HYDRODIURIL) 25 MG tablet Take 1 tablet (25 mg total) by mouth daily. 90 tablet 3  . levothyroxine (SYNTHROID, LEVOTHROID) 75 MCG tablet Take 1 tablet (75 mcg total) by mouth daily before breakfast. 90 tablet 1  . lisinopril (PRINIVIL,ZESTRIL) 20 MG tablet TAKE 1 TABLET BY MOUTH TWO  TIMES DAILY 180 tablet 0  . Thiamine HCl (VITAMIN B-1) 250 MG tablet Take 1 tablet (250 mg  total) by mouth daily as needed. 30 tablet 6   No current facility-administered medications for this visit.     Allergies:   Patient has no known allergies.    Social History:  The patient  reports that he quit smoking about 9 years ago. His smoking use included Cigarettes. His smokeless tobacco use includes Chew. He reports that he drinks about 9.0 - 12.0 oz of alcohol per week . He reports that he does not use drugs.   Family History:  The patient's family history includes COPD in his maternal grandfather and mother; Cancer in his maternal uncle and mother; Cancer (age of onset: 7) in his father; Diabetes in his father, maternal grandmother, and mother; Emphysema in his maternal grandfather; Heart disease in his father; Hypertension in his  father and mother; Liver cancer in his father; Lung cancer in his mother; Macular degeneration in his mother; Neuropathy in his mother; Parkinson's disease in his maternal grandmother; Stroke in his paternal grandfather.    ROS:  Please see the history of present illness.   Otherwise, review of systems are positive for neuropathy.   All other systems are reviewed and negative.    PHYSICAL EXAM: VS:  BP 130/78 (BP Location: Right Arm, Patient Position: Sitting, Cuff Size: Normal)   Pulse (!) 52   Ht 6\' 3"  (1.905 m)   Wt 102.5 kg (226 lb)   BMI 28.25 kg/m  , BMI Body mass index is 28.25 kg/m. GENERAL:  Well appearing HEENT:  Pupils equal round and reactive, fundi not visualized, oral mucosa unremarkable NECK:  No jugular venous distention, waveform within normal limits, carotid upstroke brisk and symmetric, no bruits, no thyromegaly LYMPHATICS:  No cervical adenopathy LUNGS:  Clear to auscultation bilaterally HEART:  RRR.  PMI not displaced or sustained,S1 and S2 within normal limits, no S3, no S4, no clicks, no rubs, no murmurs ABD:  Flat, positive bowel sounds normal in frequency in pitch, no bruits, no rebound, no guarding, no midline pulsatile mass, no hepatomegaly, no splenomegaly EXT:  2 plus pulses throughout, no edema, no cyanosis no clubbing SKIN:  No rashes no nodules NEURO:  Cranial nerves II through XII grossly intact, motor grossly intact throughout PSYCH:  Cognitively intact, oriented to person place and time    EKG:  EKG is not ordered today. The ekg ordered 05/04/16 demonstrates sinus bradycardia rate 46 bpm.  LAFB.   Recent Labs: 05/04/2016: ALT 42; BUN 15; Creatinine, Ser 1.14; Hemoglobin 14.9; Platelets 256.0; Potassium 4.9; Sodium 137; TSH 5.44    Lipid Panel    Component Value Date/Time   CHOL 188 05/04/2016 1053   TRIG 78.0 05/04/2016 1053   HDL 67.80 05/04/2016 1053   CHOLHDL 3 05/04/2016 1053   VLDL 15.6 05/04/2016 1053   LDLCALC 105 (H) 05/04/2016 1053     LDLDIRECT 134.4 09/08/2010 0826      Wt Readings from Last 3 Encounters:  06/02/16 102.5 kg (226 lb)  05/04/16 103.5 kg (228 lb 3.2 oz)  06/21/15 100.7 kg (222 lb)      ASSESSMENT AND PLAN:  # Shortness of breath:  Zachary Rowe reports exertional shortness of breath that is new over the last several months.  There is no evidence of heart failure.  We will get an ETT to evaluate for ischemia.  # Hypertension: BP remains above goal.  We will add amlodipine 5 mg daily.  He just ordered a 3 month supply of HCTZ.  When this runs out we will combine  his HCTZ and lisinopril into a single tablet.  # Hyperlipidemia:  Continue atorvastatin.  LDL 105 05/2016.  Current medicines are reviewed at length with the patient today.  The patient does not have concerns regarding medicines.  The following changes have been made:  Add amlodipine  Labs/ tests ordered today include:   Orders Placed This Encounter  Procedures  . Exercise Tolerance Test     Disposition:   FU with Mykai Wendorf C. Oval Linsey, MD, [facility] in 6 months.  Pharmacy in 1 month.     This note was written with the assistance of speech recognition software.  Please excuse any transcriptional errors.  Signed, Aretta Stetzel C. Oval Linsey, MD, [facility]  06/02/2016 11:57 PM    [place]

## 2016-06-13 ENCOUNTER — Telehealth (HOSPITAL_COMMUNITY): Payer: Self-pay | Admitting: Cardiovascular Disease

## 2016-06-13 NOTE — Telephone Encounter (Signed)
Patient called back and stated that his wife just recently had knee surgery and did not feel comfortable with leaving her home alone and so he would call back Monday after finding out what her physical therapy schedule is.

## 2016-06-22 ENCOUNTER — Ambulatory Visit (INDEPENDENT_AMBULATORY_CARE_PROVIDER_SITE_OTHER): Payer: BLUE CROSS/BLUE SHIELD

## 2016-06-22 DIAGNOSIS — R0602 Shortness of breath: Secondary | ICD-10-CM

## 2016-06-23 LAB — EXERCISE TOLERANCE TEST
CHL CUP MPHR: 159 {beats}/min
CHL CUP RESTING HR STRESS: 48 {beats}/min
CSEPEDS: 2 s
CSEPPHR: 137 {beats}/min
Estimated workload: 11.8 METS
Exercise duration (min): 10 min
Percent HR: 86 %
RPE: 19

## 2016-06-26 ENCOUNTER — Encounter: Payer: Self-pay | Admitting: Cardiovascular Disease

## 2016-06-26 ENCOUNTER — Telehealth: Payer: Self-pay | Admitting: *Deleted

## 2016-06-26 MED ORDER — LISINOPRIL-HYDROCHLOROTHIAZIDE 20-25 MG PO TABS: 1.0000 | ORAL_TABLET | Freq: Every day | ORAL | 5 refills | Status: DC

## 2016-06-26 NOTE — Telephone Encounter (Signed)
Received mychart message below Spoke with patient and reviewed Dr Blenda Mounts dictation from last office visit which states changing to the Lisinopril HCT comb tablet when finishes current tablets.  Patient has office visit 07/04/16 with Pharm D and will continue to monitor blood pressure and bring readings to that appointment. Advised to call if blood pressure starts to go up, verbalized understanding.   From: [name]  Sent: 06/26/2016  6:30 AM  To: [facility] Clinical Pool  Subject: Non-Urgent Medical Question             Good morning Dr Oval Linsey, the last time we met you had mentioned a combination drug you called a 25/20. 20 mg of Lisinopril and 25 mg Hydrochlorot, I am now running low on both these drugs a am asking you to send a script to [place] for a 90 day supply of the 25/20 drug.  Also if you would, send a script for a 90 supply of the Amlodipine as I am running out of this one too.    Thank you  Izola Price

## 2016-06-27 NOTE — Telephone Encounter (Signed)
OK to fill with combination tablet.

## 2016-07-04 ENCOUNTER — Ambulatory Visit (INDEPENDENT_AMBULATORY_CARE_PROVIDER_SITE_OTHER): Payer: BLUE CROSS/BLUE SHIELD | Admitting: Pharmacist

## 2016-07-04 ENCOUNTER — Encounter: Payer: Self-pay | Admitting: Pharmacist

## 2016-07-04 VITALS — BP 128/80 | HR 45

## 2016-07-04 DIAGNOSIS — I10 Essential (primary) hypertension: Secondary | ICD-10-CM

## 2016-07-04 MED ORDER — AMLODIPINE BESYLATE 10 MG PO TABS: 10.0000 mg | ORAL_TABLET | Freq: Every day | ORAL | 5 refills | Status: DC

## 2016-07-04 NOTE — Patient Instructions (Addendum)
Return for a follow up appointment in 4-6 weeks  Check your blood pressure at home daily (if able) and keep record of the readings.  Take your BP meds as follows: INCREASE amlodipine 10mg  (you may take 2 tablets of your current supply) daily  Bring all of your meds, your BP cuff and your record of home blood pressures to your next appointment.  Exercise as you're able, try to walk approximately 30 minutes per day.  Keep salt intake to a minimum, especially watch canned and prepared boxed foods.  Eat more fresh fruits and vegetables and fewer canned items.  Avoid eating in fast food restaurants.    HOW TO TAKE YOUR BLOOD PRESSURE: . Rest 5 minutes before taking your blood pressure. .  Don't smoke or drink caffeinated beverages for at least 30 minutes before. . Take your blood pressure before (not after) you eat. . Sit comfortably with your back supported and both feet on the floor (don't cross your legs). . Elevate your arm to heart level on a table or a desk. . Use the proper sized cuff. It should fit smoothly and snugly around your bare upper arm. There should be enough room to slip a fingertip under the cuff. The bottom edge of the cuff should be 1 inch above the crease of the elbow. . Ideally, take 3 measurements at one sitting and record the average.

## 2016-07-04 NOTE — Progress Notes (Signed)
Patient ID: Zachary Rowe                 DOB: 09-13-1955                      MRN: [id number]     HPI: Zachary Rowe is a 61 y.o. male patient of Dr. Oval Linsey who presents today for hypertension evaluation.  PMH includes hypertension, hyperlipidemia, and hypothyroidism. He was sent to cardiology for management of his DOE and increasing blood pressure. At his most recent visit he was started on amlodipine 5mg  daily.   He presents today stating he has been doing well on medications. He reports he recently changed dosage of lisinopril/HCTZ to the combination tablet. He reports he was on lisinopril 40mg  and HCTZ 25mg  (a dose decrease in lisinopril). He has tolerated amlodipine well. Though, he reports that he has gotten dizzy a few times over the last few weeks. Specifically when standing. He states he checked his pressure during one of these episodes and it was about the same as all his other readings.   He also reports that his cuff has measured about 71mmHg different between his right arm and left arm.   Current HTN meds:  Lisinopril/HCTZ 20-25mg  daily at night Amlodipine 5mg  daily in the morning  BP goal: <130/80  Family History: COPD in his maternal grandfather and mother; Cancer in his maternal uncle and mother; Cancer (age of onset: 1) in his father; Diabetes in his father, maternal grandmother, and mother; Emphysema in his maternal grandfather; Heart disease in his father; Hypertension in his father and mother; Liver cancer in his father; Lung cancer in his mother; Macular degeneration in his mother; Neuropathy in his mother; Parkinson's disease in his maternal grandmother; Stroke in his paternal grandfather.   Social History: Former chewing tobacco user - Quit 3 years ago. Quit smoking in 2008. 2 drinks of liquor per day.   Diet: Most meals prepared from home. Endorses small amount of salt when cooking none at table. Eats most vegetables. 2-3 cups of coffee per morning.   Exercise:  Active in yard and doing yard work.   Home BP readings: wrist cuff - pressures have been running in 130s-140s/50s-70s.   Wt Readings from Last 3 Encounters:  06/02/16 226 lb (102.5 kg)  05/04/16 228 lb 3.2 oz (103.5 kg)  06/21/15 222 lb (100.7 kg)   BP Readings from Last 3 Encounters:  07/04/16 128/80  06/02/16 130/78  05/04/16 (!) 184/84   Pulse Readings from Last 3 Encounters:  07/04/16 (!) 45  06/02/16 (!) 52  05/04/16 (!) 51    Renal function: CrCl cannot be calculated (Patient's most recent lab result is older than the maximum 21 days allowed.).  Past Medical History:  Diagnosis Date  . 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 Back pain 04/02/2014  . Chicken pox as a child  . Chronic sinusitis 01-03-2000  . Depression with anxiety 05/07/2016  . Elevated BP 09/02/2010  . Fatigue 11/01/2010  . Hard of hearing    wears bilateral hearing aids  . History of chicken pox 09/02/2010  . History of measles 09/02/2010  . HTN (hypertension) 09/02/2010  . Hyperlipidemia   . Hypertension   . Measles as a child  . Multiple allergies 09/02/2010  . Overweight(278.02) 09/02/2010  . Personal history of colonic polyps - adenoma 04/17/2007   04/2007 - 3 sigmoid polyps - at least 1 tubular adenoma  . Peyronie disease 10/14/2012  .  Preventative health care 11/17/2012  . Raynaud disease 04/11/2011  . Thyroid disease     Current Outpatient Prescriptions on File Prior to Visit  Medication Sig Dispense Refill  . atorvastatin (LIPITOR) 10 MG tablet TAKE 1 TABLET BY MOUTH  DAILY 90 tablet 0  . Azelastine HCl 0.15 % SOLN USE TWO SPRAY(S) NASALLY ONCE DAILY AS NEEDED 30 mL 1  . folic acid (FOLVITE) 299 MCG tablet Take 1 tablet (400 mcg total) by mouth daily. 30 tablet 3  . levothyroxine (SYNTHROID, LEVOTHROID) 75 MCG tablet Take 1 tablet (75 mcg total) by mouth daily before breakfast. 90 tablet 1  . lisinopril-hydrochlorothiazide (PRINZIDE,ZESTORETIC) 20-25 MG tablet Take 1 tablet by mouth daily. 30  tablet 5  . Thiamine HCl (VITAMIN B-1) 250 MG tablet Take 1 tablet (250 mg total) by mouth daily as needed. 30 tablet 6   No current facility-administered medications on file prior to visit.     No Known Allergies  Blood pressure 128/80, pulse (!) 45, SpO2 98 %.   Assessment/Plan: Hypertension: BP is slightly above goal in office while sitting, which is consistent with home measurements. Orthostatics negative today and pressures about 68mmHg different between arms. Will increase amlodipine to 10mg  daily. Continue to monitor and follow up in 4-6 weeks in blood pressure clinic. Advised to call if dizziness increases with increased dose of amlodipine.    Thank you, Lelan Pons. Patterson Hammersmith, [facility]  07/04/2016 2:52 PM

## 2016-08-04 ENCOUNTER — Encounter: Payer: Self-pay | Admitting: Family Medicine

## 2016-08-04 ENCOUNTER — Ambulatory Visit (INDEPENDENT_AMBULATORY_CARE_PROVIDER_SITE_OTHER): Payer: BLUE CROSS/BLUE SHIELD | Admitting: Family Medicine

## 2016-08-04 ENCOUNTER — Other Ambulatory Visit: Payer: Self-pay | Admitting: Family Medicine

## 2016-08-04 DIAGNOSIS — Z0000 Encounter for general adult medical examination without abnormal findings: Secondary | ICD-10-CM

## 2016-08-04 DIAGNOSIS — R001 Bradycardia, unspecified: Secondary | ICD-10-CM

## 2016-08-04 DIAGNOSIS — R739 Hyperglycemia, unspecified: Secondary | ICD-10-CM

## 2016-08-04 DIAGNOSIS — K5900 Constipation, unspecified: Secondary | ICD-10-CM | POA: Diagnosis not present

## 2016-08-04 DIAGNOSIS — E7800 Pure hypercholesterolemia, unspecified: Secondary | ICD-10-CM | POA: Diagnosis not present

## 2016-08-04 DIAGNOSIS — I10 Essential (primary) hypertension: Secondary | ICD-10-CM | POA: Diagnosis not present

## 2016-08-04 HISTORY — DX: Hyperglycemia, unspecified: R73.9

## 2016-08-04 LAB — CBC
HCT: 42.4 % (ref 39.0–52.0)
HEMOGLOBIN: 14.3 g/dL (ref 13.0–17.0)
MCHC: 33.8 g/dL (ref 30.0–36.0)
MCV: 98.7 fl (ref 78.0–100.0)
PLATELETS: 259 10*3/uL (ref 150.0–400.0)
RBC: 4.3 Mil/uL (ref 4.22–5.81)
RDW: 14.2 % (ref 11.5–15.5)
WBC: 5.1 10*3/uL (ref 4.0–10.5)

## 2016-08-04 LAB — COMPREHENSIVE METABOLIC PANEL
ALBUMIN: 4.5 g/dL (ref 3.5–5.2)
ALT: 35 U/L (ref 0–53)
AST: 27 U/L (ref 0–37)
Alkaline Phosphatase: 34 U/L — ABNORMAL LOW (ref 39–117)
BILIRUBIN TOTAL: 0.8 mg/dL (ref 0.2–1.2)
BUN: 17 mg/dL (ref 6–23)
CALCIUM: 9.6 mg/dL (ref 8.4–10.5)
CHLORIDE: 100 meq/L (ref 96–112)
CO2: 32 meq/L (ref 19–32)
CREATININE: 1.3 mg/dL (ref 0.40–1.50)
GFR: 59.51 mL/min — AB (ref >60.00)
Glucose, Bld: 111 mg/dL — ABNORMAL HIGH (ref 70–99)
Potassium: 4.5 mEq/L (ref 3.5–5.1)
Sodium: 137 mEq/L (ref 135–145)
Total Protein: 7.2 g/dL (ref 6.0–8.3)

## 2016-08-04 LAB — LIPID PANEL
CHOL/HDL RATIO: 3
Cholesterol: 172 mg/dL (ref 0–200)
HDL: 61.9 mg/dL (ref >39.00)
LDL Cholesterol: 91 mg/dL (ref 0–99)
NonHDL: 110.08
TRIGLYCERIDES: 95 mg/dL (ref 0.0–149.0)
VLDL: 19 mg/dL (ref 0.0–40.0)

## 2016-08-04 LAB — HEMOGLOBIN A1C: Hgb A1c MFr Bld: 5.8 % (ref 4.6–6.5)

## 2016-08-04 LAB — TSH: TSH: 5.19 u[IU]/mL — AB (ref 0.35–4.50)

## 2016-08-04 MED ORDER — LEVOTHYROXINE SODIUM 88 MCG PO TABS
88.0000 ug | ORAL_TABLET | Freq: Every day | ORAL | 0 refills | Status: DC
Start: 2016-08-04 — End: 2016-11-01

## 2016-08-04 NOTE — Progress Notes (Signed)
Subjective:  I acted as a Education administrator for Dr. Charlett Blake. [place], [place]  Patient ID: Zachary Rowe, male    DOB: [birth year], 61 y.o.   MRN: [id number]  Chief Complaint  Patient presents with  . Follow-up    HPI  Patient is in today for a 3 month follow up. He feels well today. His amlodipine has been increased to 10 mg daily and upon questioning he does acknowledge that he's been struggling with a little more constipation. Only moving his bowels couple times a week. No bloody or tarry stool. No abdominal pain, nausea, vomiting or anorexia. No other recent concerns or febrile illness. Denies CP/palp/SOB/HA/congestion/fevers or GU c/o. Taking meds as prescribed  Patient Care Team: Mosie Lukes, MD as PCP - General (Family Medicine) Gatha Mayer, MD as Consulting Physician (Gastroenterology) Melina Schools, OD as Consulting Physician (Optometry)   Past Medical History:  Diagnosis Date  . 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 Back pain 04/02/2014  . Bradycardia 08/06/2016  . Chicken pox as a child  . Chronic sinusitis 01-03-2000  . Constipation 08/06/2016  . Depression with anxiety 05/07/2016  . Elevated BP 09/02/2010  . Fatigue 11/01/2010  . Hard of hearing    wears bilateral hearing aids  . History of chicken pox 09/02/2010  . History of measles 09/02/2010  . HTN (hypertension) 09/02/2010  . Hyperglycemia 08/04/2016  . Hyperlipidemia   . Hypertension   . Measles as a child  . Multiple allergies 09/02/2010  . Overweight(278.02) 09/02/2010  . Personal history of colonic polyps - adenoma 04/17/2007   04/2007 - 3 sigmoid polyps - at least 1 tubular adenoma  . Peyronie disease 10/14/2012  . Preventative health care 11/17/2012  . Raynaud disease 04/11/2011  . Thyroid disease     Past Surgical History:  Procedure Laterality Date  . COLONOSCOPY    . SKIN BIOPSY     BB in forehead    Family History  Problem Relation Age of Onset  . Hypertension Mother   . Diabetes Mother    borderline  . Neuropathy Mother   . Macular degeneration Mother   . COPD Mother        recent pneumonia  . Cancer Mother        lung, refused chemo, tolerated radiation  . Lung cancer Mother   . Hypertension Father   . Diabetes Father        type 2  . Cancer Father 96       liver  . Liver cancer Father   . Heart disease Father   . Diabetes Maternal Grandmother   . Parkinson's disease Maternal Grandmother   . Emphysema Maternal Grandfather   . COPD Maternal Grandfather   . Stroke Paternal Grandfather   . Cancer Maternal Uncle        prostate cancer  . Colon cancer Neg Hx   . Esophageal cancer Neg Hx   . Rectal cancer Neg Hx   . Stomach cancer Neg Hx     Social History   Social History  . Marital status: Married    Spouse name: N/A  . Number of children: N/A  . Years of education: N/A   Occupational History  . Not on file.   Social History Main Topics  . Smoking status: Former Smoker    Types: Cigarettes    Quit date: 01/03/2007  . Smokeless tobacco: Current User    Types: Chew     Comment: dip  .  Alcohol use 9.0 - 12.0 oz/week    15 - 20 Glasses of wine per week     Comment: occas drinks Scotch  . Drug use: No  . Sexual activity: Yes    Partners: Female     Comment: lives with wife   Other Topics Concern  . Not on file   Social History Narrative  . No narrative on file    Outpatient Medications Prior to Visit  Medication Sig Dispense Refill  . amLODipine (NORVASC) 10 MG tablet Take 1 tablet (10 mg total) by mouth daily. 30 tablet 5  . atorvastatin (LIPITOR) 10 MG tablet TAKE 1 TABLET BY MOUTH  DAILY 90 tablet 0  . Azelastine HCl 0.15 % SOLN USE TWO SPRAY(S) NASALLY ONCE DAILY AS NEEDED 30 mL 1  . folic acid (FOLVITE) 295 MCG tablet Take 1 tablet (400 mcg total) by mouth daily. 30 tablet 3  . lisinopril-hydrochlorothiazide (PRINZIDE,ZESTORETIC) 20-25 MG tablet Take 1 tablet by mouth daily. 30 tablet 5  . Thiamine HCl (VITAMIN B-1) 250 MG tablet Take 1  tablet (250 mg total) by mouth daily as needed. 30 tablet 6  . levothyroxine (SYNTHROID, LEVOTHROID) 75 MCG tablet Take 1 tablet (75 mcg total) by mouth daily before breakfast. 90 tablet 1   No facility-administered medications prior to visit.     No Known Allergies  Review of Systems  Constitutional: Negative for fever and malaise/fatigue.  HENT: Negative for congestion.   Eyes: Negative for blurred vision.  Respiratory: Negative for cough and shortness of breath.   Cardiovascular: Negative for chest pain, palpitations and leg swelling.  Gastrointestinal: Positive for constipation. Negative for blood in stool, melena and vomiting.  Musculoskeletal: Negative for back pain.  Skin: Negative for rash.  Neurological: Negative for loss of consciousness and headaches.       Objective:    Physical Exam  Constitutional: He is oriented to person, place, and time. He appears well-developed and well-nourished. No distress.  HENT:  Head: Normocephalic and atraumatic.  Nose: Nose normal.  Eyes: Right eye exhibits no discharge. Left eye exhibits no discharge.  Neck: Normal range of motion. Neck supple.  Cardiovascular: Regular rhythm.   No murmur heard. bradycardia  Pulmonary/Chest: Effort normal and breath sounds normal.  Abdominal: Soft. Bowel sounds are normal. There is no tenderness.  Musculoskeletal: He exhibits no edema.  Neurological: He is alert and oriented to person, place, and time.  Skin: Skin is warm and dry.  Psychiatric: He has a normal mood and affect.  Nursing note and vitals reviewed.   Wt 225 lb 3.2 oz (102.2 kg)   BMI 28.15 kg/m  Wt Readings from Last 3 Encounters:  08/04/16 225 lb 3.2 oz (102.2 kg)  06/02/16 226 lb (102.5 kg)  05/04/16 228 lb 3.2 oz (103.5 kg)   BP Readings from Last 3 Encounters:  07/04/16 128/80  06/02/16 130/78  05/04/16 (!) 184/84     Immunization History  Administered Date(s) Administered  . Tdap 05/04/2016    Health  Maintenance  Topic Date Due  . INFLUENZA VACCINE  08/02/2016  . COLONOSCOPY  03/07/2018  . TETANUS/TDAP  05/05/2026  . Hepatitis C Screening  Completed  . HIV Screening  Completed    Lab Results  Component Value Date   WBC 5.1 08/04/2016   HGB 14.3 08/04/2016   HCT 42.4 08/04/2016   PLT 259.0 08/04/2016   GLUCOSE 111 (H) 08/04/2016   CHOL 172 08/04/2016   TRIG 95.0 08/04/2016   HDL  61.90 08/04/2016   LDLDIRECT 134.4 09/08/2010   LDLCALC 91 08/04/2016   ALT 35 08/04/2016   AST 27 08/04/2016   NA 137 08/04/2016   K 4.5 08/04/2016   CL 100 08/04/2016   CREATININE 1.30 08/04/2016   BUN 17 08/04/2016   CO2 32 08/04/2016   TSH 5.19 (H) 08/04/2016   PSA 0.38 05/04/2016   HGBA1C 5.8 08/04/2016    Lab Results  Component Value Date   TSH 5.19 (H) 08/04/2016   Lab Results  Component Value Date   WBC 5.1 08/04/2016   HGB 14.3 08/04/2016   HCT 42.4 08/04/2016   MCV 98.7 08/04/2016   PLT 259.0 08/04/2016   Lab Results  Component Value Date   NA 137 08/04/2016   K 4.5 08/04/2016   CO2 32 08/04/2016   GLUCOSE 111 (H) 08/04/2016   BUN 17 08/04/2016   CREATININE 1.30 08/04/2016   BILITOT 0.8 08/04/2016   ALKPHOS 34 (L) 08/04/2016   AST 27 08/04/2016   ALT 35 08/04/2016   PROT 7.2 08/04/2016   ALBUMIN 4.5 08/04/2016   CALCIUM 9.6 08/04/2016   GFR 59.51 (L) 08/04/2016   Lab Results  Component Value Date   CHOL 172 08/04/2016   Lab Results  Component Value Date   HDL 61.90 08/04/2016   Lab Results  Component Value Date   LDLCALC 91 08/04/2016   Lab Results  Component Value Date   TRIG 95.0 08/04/2016   Lab Results  Component Value Date   CHOLHDL 3 08/04/2016   Lab Results  Component Value Date   HGBA1C 5.8 08/04/2016         Assessment & Plan:   Problem List Items Addressed This Visit    HTN (hypertension)    Well controlled, no changes to meds. Encouraged heart healthy diet such as the DASH diet and exercise as tolerated. But some symptomatic  bradycardia will try altering the Amlodipine to 5 mg po bid      Relevant Orders   CBC (Completed)   Comprehensive metabolic panel (Completed)   TSH (Completed)   Hyperlipidemia    Tolerating statin, encouraged heart healthy diet, avoid trans fats, minimize simple carbs and saturated fats. Increase exercise as tolerated      Relevant Orders   Lipid panel (Completed)   Preventative health care    Encouraged to consider the Shingrix shot, given tetanus shot      Hyperglycemia    minimize simple carbs. Increase exercise as tolerated.       Relevant Orders   Hemoglobin A1c (Completed)   Bradycardia    Is following with cardiology and is asymptomatic, will split his Amlodipine into 2 5 mg doses daily and see if that helps. Follow up with cardiology      Constipation    Encouraged increased hydration and fiber in diet. Daily probiotics. If bowels not moving can use MOM 2 tbls po in 4 oz of warm prune juice by mouth every 2-3 days. If no results then repeat in 4 hours with  Dulcolax suppository pr, may repeat again in 4 more hours as needed. Seek care if symptoms worsen. Consider daily Miralax and/or Dulcolax if symptoms persist.          I am having Mr. Hackman maintain his Azelastine HCl, vitamin B-1, folic acid, atorvastatin, lisinopril-hydrochlorothiazide, and amLODipine.  No orders of the defined types were placed in this encounter.   CMA served as Education administrator during this visit. History, Physical and Plan performed by  medical provider. Documentation and orders reviewed and attested to.  Penni Homans, MD

## 2016-08-04 NOTE — Assessment & Plan Note (Signed)
Tolerating statin, encouraged heart healthy diet, avoid trans fats, minimize simple carbs and saturated fats. Increase exercise as tolerated 

## 2016-08-04 NOTE — Assessment & Plan Note (Signed)
Well controlled, no changes to meds. Encouraged heart healthy diet such as the DASH diet and exercise as tolerated. But some symptomatic bradycardia will try altering the Amlodipine to 5 mg po bid

## 2016-08-04 NOTE — Patient Instructions (Addendum)
64 oz of clear fluids daily, drink extra fluids for every cup of coffee Lean protein every 4 hours or so. Split the Amlodipine 10 mg tab in 1/2 and take 1/2 tab twice daily  Encouraged increased hydration and fiber in diet. Daily probiotics. If bowels not moving can use MOM 2 tbls po in 4 oz of warm prune juice by mouth every 2-3 days. If no results then repeat in 4 hours with  Dulcolax suppository pr, may repeat again in 4 more hours as needed. Seek care if symptoms worsen. Consider daily Miralax and/or Dulcolax if symptoms persist.  Bradycardia, Adult Bradycardia is a slower-than-normal heartbeat. A normal resting heart rate for an adult ranges from 60 to 100 beats per minute. With bradycardia, the resting heart rate is less than 60 beats per minute. Bradycardia can prevent enough oxygen from reaching certain areas of your body when you are active. It can be serious if it keeps enough oxygen from reaching your brain and other parts of your body. Bradycardia is not a problem for everyone. For some healthy adults, a slow resting heart rate is normal. What are the causes? This condition may be caused by:  A problem with the heart, including: ? A problem with the heart's electrical system, such as a heart block. ? A problem with the heart's natural pacemaker (sinus node). ? Heart disease. ? A heart attack. ? Heart damage. ? A heart infection. ? A heart condition that is present at birth (congenital heart defect).  Certain medicines that treat heart conditions.  Certain conditions, such as hypothyroidism and obstructive sleep apnea.  Problems with the balance of chemicals and other substances, like potassium, in the blood.  What increases the risk? This condition is more likely to develop in adults who:  Are age 48 or older.  Have high blood pressure (hypertension), high cholesterol (hyperlipidemia), or diabetes.  Drink heavily, use tobacco or nicotine products, or use drugs.  Are  stressed.  What are the signs or symptoms? Symptoms of this condition include:  Light-headedness.  Feeling faint or fainting.  Fatigue and weakness.  Shortness of breath.  Chest pain (angina).  Drowsiness.  Confusion.  Dizziness.  How is this diagnosed? This condition may be diagnosed based on:  Your symptoms.  Your medical history.  A physical exam.  During the exam, your health care provider will listen to your heartbeat and check your pulse. To confirm the diagnosis, your health care provider may order tests, such as:  Blood tests.  An electrocardiogram (ECG). This test records the heart's electrical activity. The test can show how fast your heart is beating and whether the heartbeat is steady.  A test in which you wear a portable device (event recorder or Holter monitor) to record your heart's electrical activity while you go about your day.  Anexercise test.  How is this treated? Treatment for this condition depends on the cause of the condition and how severe your symptoms are. Treatment may involve:  Treatment of the underlying condition.  Changing your medicines or how much medicine you take.  Having a small, battery-operated device called a pacemaker implanted under the skin. When bradycardia occurs, this device can be used to increase your heart rate and help your heart to beat in a regular rhythm.  Follow these instructions at home: Lifestyle   Manage any health conditions that contribute to bradycardia as told by your health care provider.  Follow a heart-healthy diet. A nutrition specialist (dietitian) can help to  educate you about healthy food options and changes.  Follow an exercise program that is approved by your health care provider.  Maintain a healthy weight.  Try to reduce or manage your stress, such as with yoga or meditation. If you need help reducing stress, ask your health care provider.  Do not use use any products that contain  nicotine or tobacco, such as cigarettes and e-cigarettes. If you need help quitting, ask your health care provider.  Do not use illegal drugs.  Limit alcohol intake to no more than 1 drink per day for nonpregnant women and 2 drinks per day for men. One drink equals 12 oz of beer, 5 oz of wine, or 1 oz of hard liquor. General instructions  Take over-the-counter and prescription medicines only as told by your health care provider.  Keep all follow-up visits as directed by your health care provider. This is important. How is this prevented? In some cases, bradycardia may be prevented by:  Treating underlying medical problems.  Stopping behaviors or medicines that can trigger the condition.  Contact a health care provider if:  You feel light-headed or dizzy.  You almost faint.  You feel weak or are easily fatigued during physical activity.  You experience confusion or have memory problems. Get help right away if:  You faint.  You have an irregular heartbeat (palpitations).  You have chest pain.  You have trouble breathing. This information is not intended to replace advice given to you by your health care provider. Make sure you discuss any questions you have with your health care provider. Document Released: 09/10/2001 Document Revised: 08/17/2015 Document Reviewed: 06/10/2015 Elsevier Interactive Patient Education  2017 [employer].

## 2016-08-04 NOTE — Assessment & Plan Note (Signed)
minimize simple carbs. Increase exercise as tolerated.  

## 2016-08-04 NOTE — Assessment & Plan Note (Signed)
Encouraged to consider the Shingrix shot, given tetanus shot

## 2016-08-06 ENCOUNTER — Encounter: Payer: Self-pay | Admitting: Family Medicine

## 2016-08-06 DIAGNOSIS — R001 Bradycardia, unspecified: Secondary | ICD-10-CM

## 2016-08-06 DIAGNOSIS — K5900 Constipation, unspecified: Secondary | ICD-10-CM

## 2016-08-06 HISTORY — DX: Bradycardia, unspecified: R00.1

## 2016-08-06 HISTORY — DX: Constipation, unspecified: K59.00

## 2016-08-06 NOTE — Assessment & Plan Note (Signed)
Encouraged increased hydration and fiber in diet. Daily probiotics. If bowels not moving can use MOM 2 tbls po in 4 oz of warm prune juice by mouth every 2-3 days. If no results then repeat in 4 hours with  Dulcolax suppository pr, may repeat again in 4 more hours as needed. Seek care if symptoms worsen. Consider daily Miralax and/or Dulcolax if symptoms persist.  

## 2016-08-06 NOTE — Assessment & Plan Note (Signed)
Is following with cardiology and is asymptomatic, will split his Amlodipine into 2 5 mg doses daily and see if that helps. Follow up with cardiology

## 2016-08-15 ENCOUNTER — Ambulatory Visit (INDEPENDENT_AMBULATORY_CARE_PROVIDER_SITE_OTHER): Payer: BLUE CROSS/BLUE SHIELD | Admitting: Pharmacist Clinician (PhC)/ Clinical Pharmacy Specialist

## 2016-08-15 DIAGNOSIS — I10 Essential (primary) hypertension: Secondary | ICD-10-CM | POA: Diagnosis not present

## 2016-08-15 NOTE — Patient Instructions (Signed)
  Your blood pressure today is 120/74  (goal is < 130/80)  Check your blood pressure at home daily and keep record of the readings.  Take your BP meds as follows:  Continue with current medications  Bring all of your meds, your BP cuff and your record of home blood pressures to your next appointment.  Exercise as you're able, try to walk approximately 30 minutes per day.  Keep salt intake to a minimum, especially watch canned and prepared boxed foods.  Eat more fresh fruits and vegetables and fewer canned items.  Avoid eating in fast food restaurants.    HOW TO TAKE YOUR BLOOD PRESSURE: . Rest 5 minutes before taking your blood pressure. .  Don't smoke or drink caffeinated beverages for at least 30 minutes before. . Take your blood pressure before (not after) you eat. . Sit comfortably with your back supported and both feet on the floor (don't cross your legs). . Elevate your arm to heart level on a table or a desk. . Use the proper sized cuff. It should fit smoothly and snugly around your bare upper arm. There should be enough room to slip a fingertip under the cuff. The bottom edge of the cuff should be 1 inch above the crease of the elbow. . Ideally, take 3 measurements at one sitting and record the average.

## 2016-08-15 NOTE — Assessment & Plan Note (Addendum)
Patient has no complaints today and BP well controlled in office.  He will continue with current medications and regular home BP checks.  Advised that he average his readings every week or two to be sure they stay close to 130/80.   Also advised patient that if his BP trends upward he can consider taking amlodipine 12 hours apart from lisinopril/hctz.   Will review bradycardia concerns with Dr. Oval Linsey - this could be the cause of his occasional dizziness.

## 2016-08-15 NOTE — Progress Notes (Signed)
Patient ID: Zachary Rowe                 DOB: June 27, 1955                      MRN: [id number]     HPI: Zachary Rowe is a 61 y.o. male patient of Dr. Oval Rowe who presents today for hypertension evaluation.  PMH includes hypertension, hyperlipidemia, and hypothyroidism. He was sent to cardiology for management of his DOE and increasing blood pressure. Dr. Oval Rowe started him on amlodipine 5mg  daily and that was increased to 10 mg daily in early July.  Since then he saw his PCP Dr. Randel Rowe, and because of some ongoing dizziness, she had him adjust the dose to 5 mg twice daily.     Today he returns for follow up.  He notes that he divided the amlodipine dose for about 2 weeks, but there was not change in his dizziness, so he went back to taking 10 mg each evening.  He has not complaints of CP, SOB or LEE.  Takes all of his medications at bedtime and no problems with compliance.    Current HTN meds:  Lisinopril/HCTZ 20-25mg  qhs Amlodipine 10 mg qhs  BP goal: <130/80  Family History: COPD in his maternal grandfather and mother; Cancer in his maternal uncle and mother; Cancer (age of onset: 57) in his father; Diabetes in his father, maternal grandmother, and mother; Emphysema in his maternal grandfather; Heart disease in his father; Hypertension in his father and mother; Liver cancer in his father; Lung cancer in his mother; Macular degeneration in his mother; Neuropathy in his mother; Parkinson's disease in his maternal grandmother; Stroke in his paternal grandfather.   Social History: Former chewing tobacco user - Quit 3 years ago. Quit smoking in 2008. 2 whiskey per day at 7 pm.   Diet: Most meals prepared from home. Endorses small amount of salt when cooking none at table. Eats most vegetables. 2-3 cups of coffee per morning.   Exercise: Active in yard and doing yard work.   Home BP readings:  Average (40 readings)  133/68  HR 47  (HR range 43-59_ Wrist cuff - bought 2 years ago, checked at  home after previous MD appt, states was close (within 5 points)  Wt Readings from Last 3 Encounters:  08/04/16 225 lb 3.2 oz (102.2 kg)  06/02/16 226 lb (102.5 kg)  05/04/16 228 lb 3.2 oz (103.5 kg)   BP Readings from Last 3 Encounters:  08/15/16 120/74  07/04/16 128/80  06/02/16 130/78   Pulse Readings from Last 3 Encounters:  08/15/16 (!) 52  07/04/16 (!) 45  06/02/16 (!) 52    Renal function: Estimated Creatinine Clearance: 77.3 mL/min (by C-G formula based on SCr of 1.3 mg/dL).  Past Medical History:  Diagnosis Date  . 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 Back pain 04/02/2014  . Bradycardia 08/06/2016  . Chicken pox as a child  . Chronic sinusitis 01-03-2000  . Constipation 08/06/2016  . Depression with anxiety 05/07/2016  . Elevated BP 09/02/2010  . Fatigue 11/01/2010  . Hard of hearing    wears bilateral hearing aids  . History of chicken pox 09/02/2010  . History of measles 09/02/2010  . HTN (hypertension) 09/02/2010  . Hyperglycemia 08/04/2016  . Hyperlipidemia   . Hypertension   . Measles as a child  . Multiple allergies 09/02/2010  . Overweight(278.02) 09/02/2010  . Personal history of colonic polyps -  adenoma 04/17/2007   04/2007 - 3 sigmoid polyps - at least 1 tubular adenoma  . Peyronie disease 10/14/2012  . Preventative health care 11/17/2012  . Raynaud disease 04/11/2011  . Thyroid disease     Current Outpatient Prescriptions on File Prior to Visit  Medication Sig Dispense Refill  . amLODipine (NORVASC) 10 MG tablet Take 1 tablet (10 mg total) by mouth daily. 30 tablet 5  . atorvastatin (LIPITOR) 10 MG tablet TAKE 1 TABLET BY MOUTH  DAILY 90 tablet 0  . Azelastine HCl 0.15 % SOLN USE TWO SPRAY(S) NASALLY ONCE DAILY AS NEEDED 30 mL 1  . folic acid (FOLVITE) 371 MCG tablet Take 1 tablet (400 mcg total) by mouth daily. 30 tablet 3  . levothyroxine (SYNTHROID, LEVOTHROID) 88 MCG tablet Take 1 tablet (88 mcg total) by mouth daily before breakfast. 90 tablet 0  .  lisinopril-hydrochlorothiazide (PRINZIDE,ZESTORETIC) 20-25 MG tablet Take 1 tablet by mouth daily. 30 tablet 5  . Thiamine HCl (VITAMIN B-1) 250 MG tablet Take 1 tablet (250 mg total) by mouth daily as needed. 30 tablet 6   No current facility-administered medications on file prior to visit.     No Known Allergies  Blood pressure 120/74, pulse (!) 52.  BP right arm 118/70   Assessment/Plan:  Patient has no complaints today and BP well controlled in office.  He will continue with current medications and regular home BP checks.  Advised that he average his readings every week or two to be sure they stay close to 130/80.   Also advised patient that if his BP trends upward he can consider taking amlodipine 12 hours apart from lisinopril/hctz.   Will review bradycardia concerns with Dr. Oval Rowe - this could be the cause of his occasional dizziness.   Thank you, [name] PharmD CPP [facility]  08/15/2016 10:16 AM

## 2016-08-16 ENCOUNTER — Other Ambulatory Visit: Payer: Self-pay | Admitting: Pharmacist Clinician (PhC)/ Clinical Pharmacy Specialist

## 2016-08-16 MED ORDER — LISINOPRIL-HYDROCHLOROTHIAZIDE 20-25 MG PO TABS
1.0000 | ORAL_TABLET | Freq: Every day | ORAL | 2 refills | Status: DC
Start: 2016-08-16 — End: 2017-10-19

## 2016-08-16 MED ORDER — AMLODIPINE BESYLATE 10 MG PO TABS: 10.0000 mg | ORAL_TABLET | Freq: Every day | ORAL | 2 refills | Status: DC

## 2016-08-18 ENCOUNTER — Telehealth: Payer: Self-pay | Admitting: *Deleted

## 2016-08-18 DIAGNOSIS — R001 Bradycardia, unspecified: Secondary | ICD-10-CM

## 2016-08-18 NOTE — Telephone Encounter (Signed)
-----   Message from Skeet Latch, MD sent at 08/17/2016  6:35 PM EDT ----- Thanks Erasmo Downer.  Rip Harbour, can we please have him wear a 7 day event monitor and follow up after?  Thanks, Tiffany ----- Message ----- From: Rockne Menghini, RPH-CPP Sent: 08/15/2016  10:19 AM To: Skeet Latch, MD  Please see my office note for today on him.  His HR at home averages 47, going as low as 43 at times.  Not sure if this is the cause of his dizziness, but with it dropping that low, I wanted to bring it to your attention.    Erasmo Downer

## 2016-08-18 NOTE — Telephone Encounter (Signed)
Advised patient and will send to scheduling to arrange monitor

## 2016-08-18 NOTE — Telephone Encounter (Signed)
Left message to call back  

## 2016-08-23 NOTE — Telephone Encounter (Signed)
Patient scheduled 09/05/16

## 2016-09-05 ENCOUNTER — Ambulatory Visit (INDEPENDENT_AMBULATORY_CARE_PROVIDER_SITE_OTHER): Payer: BLUE CROSS/BLUE SHIELD

## 2016-09-05 DIAGNOSIS — R001 Bradycardia, unspecified: Secondary | ICD-10-CM

## 2016-09-16 ENCOUNTER — Other Ambulatory Visit: Payer: Self-pay | Admitting: Family Medicine

## 2016-09-27 ENCOUNTER — Encounter: Payer: Self-pay | Admitting: Cardiovascular Disease

## 2016-10-07 ENCOUNTER — Other Ambulatory Visit: Payer: Self-pay | Admitting: Family Medicine

## 2016-11-01 ENCOUNTER — Other Ambulatory Visit: Payer: Self-pay | Admitting: Family Medicine

## 2016-11-07 ENCOUNTER — Ambulatory Visit: Payer: BLUE CROSS/BLUE SHIELD | Admitting: Family Medicine

## 2016-11-07 ENCOUNTER — Encounter: Payer: Self-pay | Admitting: Family Medicine

## 2016-11-07 ENCOUNTER — Ambulatory Visit (HOSPITAL_BASED_OUTPATIENT_CLINIC_OR_DEPARTMENT_OTHER)
Admission: RE | Admit: 2016-11-07 | Discharge: 2016-11-07 | Disposition: A | Discharge status: Home / Self Care | Payer: BLUE CROSS/BLUE SHIELD | Source: Ambulatory Visit | Attending: Family Medicine | Admitting: Family Medicine

## 2016-11-07 DIAGNOSIS — I10 Essential (primary) hypertension: Secondary | ICD-10-CM | POA: Diagnosis not present

## 2016-11-07 DIAGNOSIS — E079 Disorder of thyroid, unspecified: Secondary | ICD-10-CM

## 2016-11-07 DIAGNOSIS — R42 Dizziness and giddiness: Secondary | ICD-10-CM

## 2016-11-07 DIAGNOSIS — E7800 Pure hypercholesterolemia, unspecified: Secondary | ICD-10-CM

## 2016-11-07 DIAGNOSIS — R0989 Other specified symptoms and signs involving the circulatory and respiratory systems: Secondary | ICD-10-CM

## 2016-11-07 DIAGNOSIS — R001 Bradycardia, unspecified: Secondary | ICD-10-CM | POA: Diagnosis not present

## 2016-11-07 DIAGNOSIS — R739 Hyperglycemia, unspecified: Secondary | ICD-10-CM

## 2016-11-07 HISTORY — DX: Other specified symptoms and signs involving the circulatory and respiratory systems: R09.89

## 2016-11-07 LAB — COMPREHENSIVE METABOLIC PANEL
ALT: 34 U/L (ref 0–53)
AST: 26 U/L (ref 0–37)
Albumin: 4.5 g/dL (ref 3.5–5.2)
Alkaline Phosphatase: 37 U/L — ABNORMAL LOW (ref 39–117)
BUN: 21 mg/dL (ref 6–23)
CO2: 31 mEq/L (ref 19–32)
Calcium: 9.7 mg/dL (ref 8.4–10.5)
Chloride: 100 mEq/L (ref 96–112)
Creatinine, Ser: 1.24 mg/dL (ref 0.40–1.50)
GFR: 62.79 mL/min (ref >60.00)
Glucose, Bld: 107 mg/dL — ABNORMAL HIGH (ref 70–99)
Potassium: 4.5 mEq/L (ref 3.5–5.1)
Sodium: 136 mEq/L (ref 135–145)
Total Bilirubin: 0.8 mg/dL (ref 0.2–1.2)
Total Protein: 7.4 g/dL (ref 6.0–8.3)

## 2016-11-07 LAB — LIPID PANEL
CHOL/HDL RATIO: 3
Cholesterol: 214 mg/dL — ABNORMAL HIGH (ref 0–200)
HDL: 61.3 mg/dL (ref >39.00)
LDL CALC: 120 mg/dL — AB (ref 0–99)
NonHDL: 152.43
Triglycerides: 163 mg/dL — ABNORMAL HIGH (ref 0.0–149.0)
VLDL: 32.6 mg/dL (ref 0.0–40.0)

## 2016-11-07 LAB — CBC
HCT: 41.1 % (ref 39.0–52.0)
Hemoglobin: 13.9 g/dL (ref 13.0–17.0)
MCHC: 33.9 g/dL (ref 30.0–36.0)
MCV: 99.7 fl (ref 78.0–100.0)
Platelets: 284 10*3/uL (ref 150.0–400.0)
RBC: 4.12 Mil/uL — ABNORMAL LOW (ref 4.22–5.81)
RDW: 13 % (ref 11.5–15.5)
WBC: 5.4 10*3/uL (ref 4.0–10.5)

## 2016-11-07 LAB — TSH: TSH: 3.47 u[IU]/mL (ref 0.35–4.50)

## 2016-11-07 LAB — HEMOGLOBIN A1C: Hgb A1c MFr Bld: 5.6 % (ref 4.6–6.5)

## 2016-11-07 NOTE — Assessment & Plan Note (Signed)
On Levothyroxine, continue to monitor 

## 2016-11-07 NOTE — Assessment & Plan Note (Signed)
Check carotid ultrasound to further evaluation

## 2016-11-07 NOTE — Assessment & Plan Note (Signed)
Less frequent and mild, add a gatorade daily and report if worsens

## 2016-11-07 NOTE — Assessment & Plan Note (Signed)
hgba1c acceptable, minimize simple carbs. Increase exercise as tolerated.  

## 2016-11-07 NOTE — Patient Instructions (Signed)

## 2016-11-07 NOTE — Assessment & Plan Note (Signed)
Encouraged heart healthy diet, increase exercise, avoid trans fats, consider a krill oil cap daily. Tolerating Atorvastatin 

## 2016-11-07 NOTE — Assessment & Plan Note (Signed)
Evaluated by cardiology and no concerns identified

## 2016-11-07 NOTE — Progress Notes (Signed)
Subjective:  I acted as a Education administrator for Dr. Charlett Blake. [place], [place]  Patient ID: Zachary Rowe, male    DOB: 02/13/1955, 61 y.o.   MRN: [id number]  No chief complaint on file.   HPI  Patient is in today for a 3 month follow up accompanied by his wife. He notes his sense of lightheadness or dizziness is still represent but improving since his last visit. His cardiac work up was unremarkable and he denies a sense of vertigo. Denies CP/palp/SOB/HA/congestion/fevers/GI or GU c/o. Taking meds as prescribed  Patient Care Team: Mosie Lukes, MD as PCP - General (Family Medicine) Gatha Mayer, MD as Consulting Physician (Gastroenterology) Melina Schools, OD as Consulting Physician (Optometry)   Past Medical History:  Diagnosis Date  . 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 Back pain 04/02/2014  . Bilateral carotid bruits 11/07/2016  . Bradycardia 08/06/2016  . Chicken pox as a child  . Chronic sinusitis 01-03-2000  . Constipation 08/06/2016  . Depression with anxiety 05/07/2016  . Elevated BP 09/02/2010  . Fatigue 11/01/2010  . Hard of hearing    wears bilateral hearing aids  . History of chicken pox 09/02/2010  . History of measles 09/02/2010  . HTN (hypertension) 09/02/2010  . Hyperglycemia 08/04/2016  . Hyperlipidemia   . Hypertension   . Measles as a child  . Multiple allergies 09/02/2010  . Overweight(278.02) 09/02/2010  . Personal history of colonic polyps - adenoma 04/17/2007   04/2007 - 3 sigmoid polyps - at least 1 tubular adenoma  . Peyronie disease 10/14/2012  . Preventative health care 11/17/2012  . Raynaud disease 04/11/2011  . Thyroid disease     Past Surgical History:  Procedure Laterality Date  . COLONOSCOPY    . SKIN BIOPSY     BB in forehead    Family History  Problem Relation Age of Onset  . Hypertension Mother   . Diabetes Mother        borderline  . Neuropathy Mother   . Macular degeneration Mother   . COPD Mother        recent pneumonia  . Cancer Mother        lung, refused chemo, tolerated radiation  . Lung cancer Mother   . Hypertension Father   . Diabetes Father        type 2  . Cancer Father 27       liver  . Liver cancer Father   . Heart disease Father   . Diabetes Maternal Grandmother   . Parkinson's disease Maternal Grandmother   . Emphysema Maternal Grandfather   . COPD Maternal Grandfather   . Stroke Paternal Grandfather   . Cancer Maternal Uncle        prostate cancer  . Colon cancer Neg Hx   . Esophageal cancer Neg Hx   . Rectal cancer Neg Hx   . Stomach cancer Neg Hx     Social History   Socioeconomic History  . Marital status: Married    Spouse name: Not on file  . Number of children: Not on file  . Years of education: Not on file  . Highest education level: Not on file  Social Needs  . Financial resource strain: Not on file  . Food insecurity - worry: Not on file  . Food insecurity - inability: Not on file  . Transportation needs - medical: Not on file  . Transportation needs - non-medical: Not on file  Occupational History  . Not  on file  Tobacco Use  . Smoking status: Former Smoker    Types: Cigarettes    Last attempt to quit: 01/03/2007    Years since quitting: 9.8  . Smokeless tobacco: Current User    Types: Chew  . Tobacco comment: dip  Substance and Sexual Activity  . Alcohol use: Yes    Alcohol/week: 9.0 - 12.0 oz    Types: 15 - 20 Glasses of wine per week    Comment: occas drinks Scotch  . Drug use: No  . Sexual activity: Yes    Partners: Female    Comment: lives with wife  Other Topics Concern  . Not on file  Social History Narrative  . Not on file    Outpatient Medications Prior to Visit  Medication Sig Dispense Refill  . amLODipine (NORVASC) 10 MG tablet Take 1 tablet (10 mg total) by mouth daily. 90 tablet 2  . atorvastatin (LIPITOR) 10 MG tablet TAKE 1 TABLET BY MOUTH  DAILY 90 tablet 0  . Azelastine HCl 0.15 % SOLN USE TWO SPRAY(S) NASALLY ONCE DAILY AS NEEDED 30 mL 0  . folic  acid (FOLVITE) 578 MCG tablet Take 1 tablet (400 mcg total) by mouth daily. 30 tablet 3  . levothyroxine (SYNTHROID, LEVOTHROID) 88 MCG tablet TAKE 1 TABLET BY MOUTH ONCE DAILY BEFORE BREAKFAST 90 tablet 0  . lisinopril-hydrochlorothiazide (PRINZIDE,ZESTORETIC) 20-25 MG tablet Take 1 tablet by mouth daily. 90 tablet 2  . Thiamine HCl (VITAMIN B-1) 250 MG tablet Take 1 tablet (250 mg total) by mouth daily as needed. 30 tablet 6   No facility-administered medications prior to visit.     No Known Allergies  ROS     Objective:    Physical Exam  BP 130/74 (BP Location: Left Arm, Patient Position: Sitting, Cuff Size: Normal)   Pulse (!) 52   Temp 97.9 F (36.6 C) (Oral)   Resp 18   Wt 231 lb 3.2 oz (104.9 kg)   SpO2 98%   BMI 28.90 kg/m  Wt Readings from Last 3 Encounters:  11/07/16 231 lb 3.2 oz (104.9 kg)  08/04/16 225 lb 3.2 oz (102.2 kg)  06/02/16 226 lb (102.5 kg)   BP Readings from Last 3 Encounters:  11/07/16 130/74  08/15/16 120/74  07/04/16 128/80     Immunization History  Administered Date(s) Administered  . Tdap 05/04/2016    Health Maintenance  Topic Date Due  . INFLUENZA VACCINE  08/02/2016  . COLONOSCOPY  03/07/2018  . TETANUS/TDAP  05/05/2026  . Hepatitis C Screening  Completed  . HIV Screening  Completed    Lab Results  Component Value Date   WBC 5.4 11/07/2016   HGB 13.9 11/07/2016   HCT 41.1 11/07/2016   PLT 284.0 11/07/2016   GLUCOSE 107 (H) 11/07/2016   CHOL 214 (H) 11/07/2016   TRIG 163.0 (H) 11/07/2016   HDL 61.30 11/07/2016   LDLDIRECT 134.4 09/08/2010   LDLCALC 120 (H) 11/07/2016   ALT 34 11/07/2016   AST 26 11/07/2016   NA 136 11/07/2016   K 4.5 11/07/2016   CL 100 11/07/2016   CREATININE 1.24 11/07/2016   BUN 21 11/07/2016   CO2 31 11/07/2016   TSH 3.47 11/07/2016   PSA 0.38 05/04/2016   HGBA1C 5.6 11/07/2016    Lab Results  Component Value Date   TSH 3.47 11/07/2016   Lab Results  Component Value Date   WBC 5.4  11/07/2016   HGB 13.9 11/07/2016   HCT 41.1 11/07/2016  MCV 99.7 11/07/2016   PLT 284.0 11/07/2016   Lab Results  Component Value Date   NA 136 11/07/2016   K 4.5 11/07/2016   CO2 31 11/07/2016   GLUCOSE 107 (H) 11/07/2016   BUN 21 11/07/2016   CREATININE 1.24 11/07/2016   BILITOT 0.8 11/07/2016   ALKPHOS 37 (L) 11/07/2016   AST 26 11/07/2016   ALT 34 11/07/2016   PROT 7.4 11/07/2016   ALBUMIN 4.5 11/07/2016   CALCIUM 9.7 11/07/2016   GFR 62.79 11/07/2016   Lab Results  Component Value Date   CHOL 214 (H) 11/07/2016   Lab Results  Component Value Date   HDL 61.30 11/07/2016   Lab Results  Component Value Date   LDLCALC 120 (H) 11/07/2016   Lab Results  Component Value Date   TRIG 163.0 (H) 11/07/2016   Lab Results  Component Value Date   CHOLHDL 3 11/07/2016   Lab Results  Component Value Date   HGBA1C 5.6 11/07/2016         Assessment & Plan:   Problem List Items Addressed This Visit    Thyroid disease    On Levothyroxine, continue to monitor      Relevant Orders   TSH (Completed)   HTN (hypertension)    Well controlled, no changes to meds. Encouraged heart healthy diet such as the DASH diet and exercise as tolerated.       Relevant Orders   CBC (Completed)   Comprehensive metabolic panel (Completed)   Hyperlipidemia    Encouraged heart healthy diet, increase exercise, avoid trans fats, consider a krill oil cap daily. Tolerating Atorvastatin      Relevant Orders   Lipid panel (Completed)   Hyperglycemia    hgba1c acceptable, minimize simple carbs. Increase exercise as tolerated.       Relevant Orders   Hemoglobin A1c (Completed)   Bradycardia    Evaluated by cardiology and no concerns identified      Dizziness    Less frequent and mild, add a gatorade daily and report if worsens      Bilateral carotid bruits    Check carotid ultrasound to further evaluation      Relevant Orders   US Carotid Bilateral      I am having  [name] maintain his vitamin B-1, folic acid, amLODipine, lisinopril-hydrochlorothiazide, atorvastatin, Azelastine HCl, and levothyroxine.  No orders of the defined types were placed in this encounter.   CMA served as Education administrator during this visit. History, Physical and Plan performed by medical provider. Documentation and orders reviewed and attested to.  Penni Homans, MD

## 2016-11-07 NOTE — Assessment & Plan Note (Signed)
Well controlled, no changes to meds. Encouraged heart healthy diet such as the DASH diet and exercise as tolerated.  °

## 2016-11-09 ENCOUNTER — Ambulatory Visit (HOSPITAL_BASED_OUTPATIENT_CLINIC_OR_DEPARTMENT_OTHER)
Admission: RE | Admit: 2016-11-09 | Discharge: 2016-11-09 | Disposition: A | Discharge status: Home / Self Care | Payer: BLUE CROSS/BLUE SHIELD | Source: Ambulatory Visit | Attending: Family Medicine | Admitting: Family Medicine

## 2016-11-09 DIAGNOSIS — I6523 Occlusion and stenosis of bilateral carotid arteries: Secondary | ICD-10-CM | POA: Diagnosis not present

## 2016-11-09 DIAGNOSIS — R0989 Other specified symptoms and signs involving the circulatory and respiratory systems: Secondary | ICD-10-CM | POA: Diagnosis not present

## 2016-12-25 ENCOUNTER — Other Ambulatory Visit: Payer: Self-pay | Admitting: Family Medicine

## 2017-02-02 ENCOUNTER — Other Ambulatory Visit: Payer: Self-pay | Admitting: Family Medicine

## 2017-03-21 ENCOUNTER — Telehealth: Payer: Self-pay | Admitting: *Deleted

## 2017-03-21 NOTE — Telephone Encounter (Signed)
Error

## 2017-05-03 ENCOUNTER — Other Ambulatory Visit: Payer: Self-pay | Admitting: Family Medicine

## 2017-05-05 DIAGNOSIS — N3001 Acute cystitis with hematuria: Secondary | ICD-10-CM | POA: Diagnosis not present

## 2017-05-14 ENCOUNTER — Other Ambulatory Visit: Payer: Self-pay | Admitting: Family Medicine

## 2017-05-15 ENCOUNTER — Encounter: Payer: BLUE CROSS/BLUE SHIELD | Admitting: Family Medicine

## 2017-05-24 ENCOUNTER — Ambulatory Visit (INDEPENDENT_AMBULATORY_CARE_PROVIDER_SITE_OTHER): Payer: BLUE CROSS/BLUE SHIELD | Admitting: Family Medicine

## 2017-05-24 ENCOUNTER — Encounter: Payer: Self-pay | Admitting: Family Medicine

## 2017-05-24 VITALS — BP 112/66 | HR 50 | Temp 97.7°F | Resp 18 | Wt 223.4 lb

## 2017-05-24 DIAGNOSIS — M1990 Unspecified osteoarthritis, unspecified site: Secondary | ICD-10-CM | POA: Diagnosis not present

## 2017-05-24 DIAGNOSIS — R351 Nocturia: Secondary | ICD-10-CM

## 2017-05-24 DIAGNOSIS — E7800 Pure hypercholesterolemia, unspecified: Secondary | ICD-10-CM

## 2017-05-24 DIAGNOSIS — I10 Essential (primary) hypertension: Secondary | ICD-10-CM

## 2017-05-24 DIAGNOSIS — Z0000 Encounter for general adult medical examination without abnormal findings: Secondary | ICD-10-CM

## 2017-05-24 DIAGNOSIS — N529 Male erectile dysfunction, unspecified: Secondary | ICD-10-CM | POA: Diagnosis not present

## 2017-05-24 DIAGNOSIS — R350 Frequency of micturition: Secondary | ICD-10-CM | POA: Diagnosis not present

## 2017-05-24 DIAGNOSIS — R739 Hyperglycemia, unspecified: Secondary | ICD-10-CM | POA: Diagnosis not present

## 2017-05-24 LAB — LIPID PANEL
CHOLESTEROL: 198 mg/dL (ref 0–200)
HDL: 59.7 mg/dL (ref >39.00)
LDL Cholesterol: 116 mg/dL — ABNORMAL HIGH (ref 0–99)
NonHDL: 137.92
Total CHOL/HDL Ratio: 3
Triglycerides: 110 mg/dL (ref 0.0–149.0)
VLDL: 22 mg/dL (ref 0.0–40.0)

## 2017-05-24 LAB — TSH: TSH: 2.34 u[IU]/mL (ref 0.35–4.50)

## 2017-05-24 LAB — COMPREHENSIVE METABOLIC PANEL
ALBUMIN: 4.6 g/dL (ref 3.5–5.2)
ALK PHOS: 40 U/L (ref 39–117)
ALT: 50 U/L (ref 0–53)
AST: 35 U/L (ref 0–37)
BUN: 22 mg/dL (ref 6–23)
CO2: 29 mEq/L (ref 19–32)
Calcium: 9.8 mg/dL (ref 8.4–10.5)
Chloride: 98 mEq/L (ref 96–112)
Creatinine, Ser: 1.4 mg/dL (ref 0.40–1.50)
GFR: 54.49 mL/min — ABNORMAL LOW (ref >60.00)
Glucose, Bld: 93 mg/dL (ref 70–99)
POTASSIUM: 4.1 meq/L (ref 3.5–5.1)
SODIUM: 136 meq/L (ref 135–145)
TOTAL PROTEIN: 7.4 g/dL (ref 6.0–8.3)
Total Bilirubin: 0.9 mg/dL (ref 0.2–1.2)

## 2017-05-24 LAB — PSA: PSA: 2.9 ng/mL (ref 0.10–4.00)

## 2017-05-24 LAB — CBC
HEMATOCRIT: 42.1 % (ref 39.0–52.0)
HEMOGLOBIN: 14.4 g/dL (ref 13.0–17.0)
MCHC: 34.2 g/dL (ref 30.0–36.0)
MCV: 98 fl (ref 78.0–100.0)
PLATELETS: 347 10*3/uL (ref 150.0–400.0)
RBC: 4.3 Mil/uL (ref 4.22–5.81)
RDW: 13.6 % (ref 11.5–15.5)
WBC: 5.5 10*3/uL (ref 4.0–10.5)

## 2017-05-24 LAB — TESTOSTERONE: Testosterone: 313 ng/dL (ref 300.00–890.00)

## 2017-05-24 LAB — HEMOGLOBIN A1C: Hgb A1c MFr Bld: 5.7 % (ref 4.6–6.5)

## 2017-05-24 NOTE — Patient Instructions (Addendum)
Keep the hands moving, lidocaine gel, aspercreme, salon pas and icy hot.   Shingrix is the new shingles shot, 2 shots over 2-6 months   Preventive Care 40-64 Years, Male Preventive care refers to lifestyle choices and visits with your health care provider that can promote health and wellness. What does preventive care include?  A yearly physical exam. This is also called an annual well check.  Dental exams once or twice a year.  Routine eye exams. Ask your health care provider how often you should have your eyes checked.  Personal lifestyle choices, including: ? Daily care of your teeth and gums. ? Regular physical activity. ? Eating a healthy diet. ? Avoiding tobacco and drug use. ? Limiting alcohol use. ? Practicing safe sex. ? Taking low-dose aspirin every day starting at age 18. What happens during an annual well check? The services and screenings done by your health care provider during your annual well check will depend on your age, overall health, lifestyle risk factors, and family history of disease. Counseling Your health care provider may ask you questions about your:  Alcohol use.  Tobacco use.  Drug use.  Emotional well-being.  Home and relationship well-being.  Sexual activity.  Eating habits.  Work and work Statistician.  Screening You may have the following tests or measurements:  Height, weight, and BMI.  Blood pressure.  Lipid and cholesterol levels. These may be checked every 5 years, or more frequently if you are over 70 years old.  Skin check.  Lung cancer screening. You may have this screening every year starting at age 70 if you have a 30-pack-year history of smoking and currently smoke or have quit within the past 15 years.  Fecal occult blood test (FOBT) of the stool. You may have this test every year starting at age 53.  Flexible sigmoidoscopy or colonoscopy. You may have a sigmoidoscopy every 5 years or a colonoscopy every 10 years  starting at age 98.  Prostate cancer screening. Recommendations will vary depending on your family history and other risks.  Hepatitis C blood test.  Hepatitis B blood test.  Sexually transmitted disease (STD) testing.  Diabetes screening. This is done by checking your blood sugar (glucose) after you have not eaten for a while (fasting). You may have this done every 1-3 years.  Discuss your test results, treatment options, and if necessary, the need for more tests with your health care provider. Vaccines Your health care provider may recommend certain vaccines, such as:  Influenza vaccine. This is recommended every year.  Tetanus, diphtheria, and acellular pertussis (Tdap, Td) vaccine. You may need a Td booster every 10 years.  Varicella vaccine. You may need this if you have not been vaccinated.  Zoster vaccine. You may need this after age 79.  Measles, mumps, and rubella (MMR) vaccine. You may need at least one dose of MMR if you were born in 1957 or later. You may also need a second dose.  Pneumococcal 13-valent conjugate (PCV13) vaccine. You may need this if you have certain conditions and have not been vaccinated.  Pneumococcal polysaccharide (PPSV23) vaccine. You may need one or two doses if you smoke cigarettes or if you have certain conditions.  Meningococcal vaccine. You may need this if you have certain conditions.  Hepatitis A vaccine. You may need this if you have certain conditions or if you travel or work in places where you may be exposed to hepatitis A.  Hepatitis B vaccine. You may need this if  you have certain conditions or if you travel or work in places where you may be exposed to hepatitis B.  Haemophilus influenzae type b (Hib) vaccine. You may need this if you have certain risk factors.  Talk to your health care provider about which screenings and vaccines you need and how often you need them. This information is not intended to replace advice given to  you by your health care provider. Make sure you discuss any questions you have with your health care provider. Document Released: 01/15/2015 Document Revised: 09/08/2015 Document Reviewed: 10/20/2014 Elsevier Interactive Patient Education  [employer].

## 2017-05-24 NOTE — Assessment & Plan Note (Signed)
Patient encouraged to maintain heart healthy diet, regular exercise, adequate sleep. Consider daily probiotics 

## 2017-05-24 NOTE — Progress Notes (Signed)
Subjective:  I acted as a Education administrator for Dr. Charlett Blake. [place], [place]  Patient ID: Zachary Rowe, male    DOB: 1955-03-02, 62 y.o.   MRN: [id number]  No chief complaint on file.   HPI  Patient is in today for an annual exam and follow up on chronic conditions including hyperglycemia, hyperlipidemiaand hypertension. No recent febrile illness or hospitalizations. He does endorse fatigue, some mild weakness and erectile dysfunction and is interested in having his testosterone checked. He is eating well and resting well. Managing activities of daily living well. Denies CP/palp/SOB/HA/congestion/fevers/GI or GU c/o. Taking meds as prescribed  Patient Care Team: Mosie Lukes, MD as PCP - General (Family Medicine) Gatha Mayer, MD as Consulting Physician (Gastroenterology) Melina Schools, OD as Consulting Physician (Optometry)   Past Medical History:  Diagnosis Date  . 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 Back pain 04/02/2014  . Bilateral carotid bruits 11/07/2016  . Bradycardia 08/06/2016  . Chicken pox as a child  . Chronic sinusitis 01-03-2000  . Constipation 08/06/2016  . Depression with anxiety 05/07/2016  . Elevated BP 09/02/2010  . Fatigue 11/01/2010  . Hard of hearing    wears bilateral hearing aids  . History of chicken pox 09/02/2010  . History of measles 09/02/2010  . HTN (hypertension) 09/02/2010  . Hyperglycemia 08/04/2016  . Hyperlipidemia   . Hypertension   . Measles as a child  . Multiple allergies 09/02/2010  . Overweight(278.02) 09/02/2010  . Personal history of colonic polyps - adenoma 04/17/2007   04/2007 - 3 sigmoid polyps - at least 1 tubular adenoma  . Peyronie disease 10/14/2012  . Preventative health care 11/17/2012  . Raynaud disease 04/11/2011  . Thyroid disease     Past Surgical History:  Procedure Laterality Date  . COLONOSCOPY    . SKIN BIOPSY     BB in forehead    Family History  Problem Relation Age of Onset  . Hypertension Mother   . Diabetes Mother          borderline  . Neuropathy Mother   . Macular degeneration Mother   . COPD Mother        recent pneumonia  . Cancer Mother        lung, refused chemo, tolerated radiation  . Lung cancer Mother   . Hypertension Father   . Diabetes Father        type 2  . Cancer Father 84       liver  . Liver cancer Father   . Heart disease Father   . Diabetes Maternal Grandmother   . Parkinson's disease Maternal Grandmother   . Emphysema Maternal Grandfather   . COPD Maternal Grandfather   . Stroke Paternal Grandfather   . Cancer Maternal Uncle        prostate cancer  . Colon cancer Neg Hx   . Esophageal cancer Neg Hx   . Rectal cancer Neg Hx   . Stomach cancer Neg Hx     Social History   Socioeconomic History  . Marital status: Married    Spouse name: Not on file  . Number of children: Not on file  . Years of education: Not on file  . Highest education level: Not on file  Occupational History  . Not on file  Social Needs  . Financial resource strain: Not on file  . Food insecurity:    Worry: Not on file    Inability: Not on file  . Transportation  needs:    Medical: Not on file    Non-medical: Not on file  Tobacco Use  . Smoking status: Former Smoker    Types: Cigarettes    Last attempt to quit: 01/03/2007    Years since quitting: 10.4  . Smokeless tobacco: Current User    Types: Chew  . Tobacco comment: dip  Substance and Sexual Activity  . Alcohol use: Yes    Alcohol/week: 9.0 - 12.0 oz    Types: 15 - 20 Glasses of wine per week    Comment: occas drinks Scotch  . Drug use: No  . Sexual activity: Yes    Partners: Female    Comment: lives with wife  Lifestyle  . Physical activity:    Days per week: Not on file    Minutes per session: Not on file  . Stress: Not on file  Relationships  . Social connections:    Talks on phone: Not on file    Gets together: Not on file    Attends religious service: Not on file    Active member of club or organization: Not on file     Attends meetings of clubs or organizations: Not on file    Relationship status: Not on file  . Intimate partner violence:    Fear of current or ex partner: Not on file    Emotionally abused: Not on file    Physically abused: Not on file    Forced sexual activity: Not on file  Other Topics Concern  . Not on file  Social History Narrative  . Not on file    Outpatient Medications Prior to Visit  Medication Sig Dispense Refill  . atorvastatin (LIPITOR) 10 MG tablet TAKE 1 TABLET BY MOUTH  DAILY 90 tablet 0  . Azelastine HCl 0.15 % SOLN USE 2 SPRAYS ONCE DAILY AS NEEDED 30 mL 3  . folic acid (FOLVITE) 124 MCG tablet Take 1 tablet (400 mcg total) by mouth daily. 30 tablet 3  . levothyroxine (SYNTHROID, LEVOTHROID) 88 MCG tablet TAKE 1 TABLET BY MOUTH ONCE DAILY BEFORE BREAKFAST 90 tablet 0  . lisinopril-hydrochlorothiazide (PRINZIDE,ZESTORETIC) 20-25 MG tablet Take 1 tablet by mouth daily. 90 tablet 2  . Thiamine HCl (VITAMIN B-1) 250 MG tablet Take 1 tablet (250 mg total) by mouth daily as needed. 30 tablet 6  . amLODipine (NORVASC) 10 MG tablet Take 1 tablet (10 mg total) by mouth daily. 90 tablet 2   No facility-administered medications prior to visit.     No Known Allergies  Review of Systems  Constitutional: Positive for malaise/fatigue. Negative for chills and fever.  HENT: Negative for congestion and hearing loss.   Eyes: Negative for discharge.  Respiratory: Negative for cough, sputum production and shortness of breath.   Cardiovascular: Negative for chest pain, palpitations and leg swelling.  Gastrointestinal: Negative for abdominal pain, blood in stool, constipation, diarrhea, heartburn, nausea and vomiting.  Genitourinary: Positive for frequency. Negative for dysuria, hematuria and urgency.  Musculoskeletal: Negative for back pain, falls and myalgias.  Skin: Negative for rash.  Neurological: Negative for dizziness, sensory change, loss of consciousness, weakness and  headaches.  Endo/Heme/Allergies: Negative for environmental allergies. Does not bruise/bleed easily.  Psychiatric/Behavioral: Negative for depression and suicidal ideas. The patient is not nervous/anxious and does not have insomnia.        Objective:    Physical Exam  Constitutional: He is oriented to person, place, and time. He appears well-developed and well-nourished. No distress.  HENT:  Head:  Normocephalic and atraumatic.  Eyes: Conjunctivae are normal.  Neck: Neck supple. No thyromegaly present.  Cardiovascular: Normal rate, regular rhythm and normal heart sounds.  No murmur heard. Pulmonary/Chest: Effort normal and breath sounds normal. No respiratory distress. He has no wheezes.  Abdominal: Soft. Bowel sounds are normal. He exhibits no mass. There is no tenderness.  Musculoskeletal: He exhibits no edema.  Lymphadenopathy:    He has no cervical adenopathy.  Neurological: He is alert and oriented to person, place, and time.  Skin: Skin is warm and dry.  Psychiatric: He has a normal mood and affect. His behavior is normal.    BP 112/66 (BP Location: Left Arm, Patient Position: Sitting, Cuff Size: Normal)   Pulse (!) 50   Temp 97.7 F (36.5 C) (Oral)   Resp 18   Wt 223 lb 6.4 oz (101.3 kg)   SpO2 98%   BMI 27.92 kg/m  Wt Readings from Last 3 Encounters:  05/24/17 223 lb 6.4 oz (101.3 kg)  11/07/16 231 lb 3.2 oz (104.9 kg)  08/04/16 225 lb 3.2 oz (102.2 kg)   BP Readings from Last 3 Encounters:  05/24/17 112/66  11/07/16 130/74  08/15/16 120/74     Immunization History  Administered Date(s) Administered  . Tdap 05/04/2016    Health Maintenance  Topic Date Due  . INFLUENZA VACCINE  08/02/2017  . COLONOSCOPY  03/07/2018  . TETANUS/TDAP  05/05/2026  . Hepatitis C Screening  Completed  . HIV Screening  Completed    Lab Results  Component Value Date   WBC 5.5 05/24/2017   HGB 14.4 05/24/2017   HCT 42.1 05/24/2017   PLT 347.0 05/24/2017   GLUCOSE 93  05/24/2017   CHOL 198 05/24/2017   TRIG 110.0 05/24/2017   HDL 59.70 05/24/2017   LDLDIRECT 134.4 09/08/2010   LDLCALC 116 (H) 05/24/2017   ALT 50 05/24/2017   AST 35 05/24/2017   NA 136 05/24/2017   K 4.1 05/24/2017   CL 98 05/24/2017   CREATININE 1.40 05/24/2017   BUN 22 05/24/2017   CO2 29 05/24/2017   TSH 2.34 05/24/2017   PSA 2.90 05/24/2017   HGBA1C 5.7 05/24/2017    Lab Results  Component Value Date   TSH 2.34 05/24/2017   Lab Results  Component Value Date   WBC 5.5 05/24/2017   HGB 14.4 05/24/2017   HCT 42.1 05/24/2017   MCV 98.0 05/24/2017   PLT 347.0 05/24/2017   Lab Results  Component Value Date   NA 136 05/24/2017   K 4.1 05/24/2017   CO2 29 05/24/2017   GLUCOSE 93 05/24/2017   BUN 22 05/24/2017   CREATININE 1.40 05/24/2017   BILITOT 0.9 05/24/2017   ALKPHOS 40 05/24/2017   AST 35 05/24/2017   ALT 50 05/24/2017   PROT 7.4 05/24/2017   ALBUMIN 4.6 05/24/2017   CALCIUM 9.8 05/24/2017   GFR 54.49 (L) 05/24/2017   Lab Results  Component Value Date   CHOL 198 05/24/2017   Lab Results  Component Value Date   HDL 59.70 05/24/2017   Lab Results  Component Value Date   LDLCALC 116 (H) 05/24/2017   Lab Results  Component Value Date   TRIG 110.0 05/24/2017   Lab Results  Component Value Date   CHOLHDL 3 05/24/2017   Lab Results  Component Value Date   HGBA1C 5.7 05/24/2017         Assessment & Plan:   Problem List Items Addressed This Visit    HTN (  hypertension)    Well controlled, no changes to meds. Encouraged heart healthy diet such as the DASH diet and exercise as tolerated.       Relevant Orders   CBC (Completed)   Comprehensive metabolic panel (Completed)   TSH (Completed)   Hyperlipidemia    Tolerating statin, encouraged heart healthy diet, avoid trans fats, minimize simple carbs and saturated fats. Increase exercise as tolerated      Relevant Orders   Lipid panel (Completed)   Preventative health care    Patient  encouraged to maintain heart healthy diet, regular exercise, adequate sleep. Consider daily probiotics.       Hyperglycemia    hgba1c acceptable, minimize simple carbs. Increase exercise as tolerated.       Relevant Orders   Hemoglobin A1c (Completed)   Arthritis    Stiffness and pain in hands noted. Encouraged to use stress balls for exercises a couple times a day and encouraged to try Lidocaine gel prn       Other Visit Diagnoses    Nocturia    -  Primary   Relevant Orders   PSA (Completed)   Urinary frequency       Relevant Orders   PSA (Completed)   Erectile dysfunction, unspecified erectile dysfunction type       Relevant Orders   Testosterone (Completed)      I am having Cheral Almas Stroope maintain his vitamin B-1, folic acid, amLODipine, lisinopril-hydrochlorothiazide, Azelastine HCl, atorvastatin, and levothyroxine.  No orders of the defined types were placed in this encounter.   CMA served as Education administrator during this visit. History, Physical and Plan performed by medical provider. Documentation and orders reviewed and attested to.  Penni Homans, MD

## 2017-05-24 NOTE — Assessment & Plan Note (Signed)
Well controlled, no changes to meds. Encouraged heart healthy diet such as the DASH diet and exercise as tolerated.  °

## 2017-05-24 NOTE — Assessment & Plan Note (Signed)
hgba1c acceptable, minimize simple carbs. Increase exercise as tolerated.  

## 2017-05-24 NOTE — Assessment & Plan Note (Signed)
Tolerating statin, encouraged heart healthy diet, avoid trans fats, minimize simple carbs and saturated fats. Increase exercise as tolerated 

## 2017-05-25 ENCOUNTER — Encounter: Payer: Self-pay | Admitting: Family Medicine

## 2017-05-28 ENCOUNTER — Encounter: Payer: Self-pay | Admitting: Family Medicine

## 2017-05-28 DIAGNOSIS — M1990 Unspecified osteoarthritis, unspecified site: Secondary | ICD-10-CM | POA: Insufficient documentation

## 2017-05-28 NOTE — Assessment & Plan Note (Signed)
Stiffness and pain in hands noted. Encouraged to use stress balls for exercises a couple times a day and encouraged to try Lidocaine gel prn

## 2017-05-29 ENCOUNTER — Other Ambulatory Visit: Payer: Self-pay | Admitting: Family Medicine

## 2017-05-29 DIAGNOSIS — R972 Elevated prostate specific antigen [PSA]: Secondary | ICD-10-CM

## 2017-05-29 DIAGNOSIS — R7989 Other specified abnormal findings of blood chemistry: Secondary | ICD-10-CM

## 2017-07-04 DIAGNOSIS — R972 Elevated prostate specific antigen [PSA]: Secondary | ICD-10-CM | POA: Diagnosis not present

## 2017-07-04 DIAGNOSIS — N5201 Erectile dysfunction due to arterial insufficiency: Secondary | ICD-10-CM | POA: Diagnosis not present

## 2017-07-12 DIAGNOSIS — R972 Elevated prostate specific antigen [PSA]: Secondary | ICD-10-CM | POA: Diagnosis not present

## 2017-07-12 DIAGNOSIS — N5201 Erectile dysfunction due to arterial insufficiency: Secondary | ICD-10-CM | POA: Diagnosis not present

## 2017-08-07 ENCOUNTER — Other Ambulatory Visit: Payer: Self-pay | Admitting: Family Medicine

## 2017-08-16 ENCOUNTER — Other Ambulatory Visit: Payer: Self-pay | Admitting: Family Medicine

## 2017-08-20 ENCOUNTER — Other Ambulatory Visit: Payer: Self-pay | Admitting: Cardiovascular Disease

## 2017-08-20 NOTE — Telephone Encounter (Signed)
Rx sent to pharmacy   

## 2017-09-19 DIAGNOSIS — H40053 Ocular hypertension, bilateral: Secondary | ICD-10-CM | POA: Diagnosis not present

## 2017-10-19 ENCOUNTER — Other Ambulatory Visit: Payer: Self-pay | Admitting: Cardiovascular Disease

## 2017-10-28 ENCOUNTER — Other Ambulatory Visit: Payer: Self-pay | Admitting: Cardiovascular Disease

## 2017-11-08 ENCOUNTER — Other Ambulatory Visit: Payer: Self-pay | Admitting: Family Medicine

## 2017-11-19 ENCOUNTER — Other Ambulatory Visit: Payer: Self-pay | Admitting: Family Medicine

## 2017-11-19 DIAGNOSIS — E785 Hyperlipidemia, unspecified: Secondary | ICD-10-CM

## 2017-11-26 ENCOUNTER — Ambulatory Visit: Payer: BLUE CROSS/BLUE SHIELD | Admitting: Family Medicine

## 2017-11-26 ENCOUNTER — Encounter: Payer: Self-pay | Admitting: Family Medicine

## 2017-11-26 DIAGNOSIS — Z23 Encounter for immunization: Secondary | ICD-10-CM | POA: Diagnosis not present

## 2017-11-26 DIAGNOSIS — R739 Hyperglycemia, unspecified: Secondary | ICD-10-CM | POA: Diagnosis not present

## 2017-11-26 DIAGNOSIS — N486 Induration penis plastica: Secondary | ICD-10-CM

## 2017-11-26 DIAGNOSIS — E785 Hyperlipidemia, unspecified: Secondary | ICD-10-CM

## 2017-11-26 DIAGNOSIS — R001 Bradycardia, unspecified: Secondary | ICD-10-CM | POA: Diagnosis not present

## 2017-11-26 DIAGNOSIS — I10 Essential (primary) hypertension: Secondary | ICD-10-CM

## 2017-11-26 LAB — COMPREHENSIVE METABOLIC PANEL
ALBUMIN: 4.4 g/dL (ref 3.5–5.2)
ALT: 85 U/L — AB (ref 0–53)
AST: 64 U/L — ABNORMAL HIGH (ref 0–37)
Alkaline Phosphatase: 49 U/L (ref 39–117)
BILIRUBIN TOTAL: 0.4 mg/dL (ref 0.2–1.2)
BUN: 25 mg/dL — ABNORMAL HIGH (ref 6–23)
CALCIUM: 9.5 mg/dL (ref 8.4–10.5)
CO2: 28 mEq/L (ref 19–32)
Chloride: 101 mEq/L (ref 96–112)
Creatinine, Ser: 1.59 mg/dL — ABNORMAL HIGH (ref 0.40–1.50)
GFR: 46.97 mL/min — AB (ref >60.00)
GLUCOSE: 91 mg/dL (ref 70–99)
Potassium: 4.7 mEq/L (ref 3.5–5.1)
Sodium: 141 mEq/L (ref 135–145)
TOTAL PROTEIN: 7 g/dL (ref 6.0–8.3)

## 2017-11-26 LAB — CBC
HCT: 42.6 % (ref 39.0–52.0)
HEMOGLOBIN: 14.6 g/dL (ref 13.0–17.0)
MCHC: 34.3 g/dL (ref 30.0–36.0)
MCV: 97.9 fl (ref 78.0–100.0)
PLATELETS: 283 10*3/uL (ref 150.0–400.0)
RBC: 4.35 Mil/uL (ref 4.22–5.81)
RDW: 13.3 % (ref 11.5–15.5)
WBC: 5.1 10*3/uL (ref 4.0–10.5)

## 2017-11-26 LAB — LIPID PANEL
Cholesterol: 163 mg/dL (ref 0–200)
HDL: 68.5 mg/dL (ref >39.00)
LDL CALC: 77 mg/dL (ref 0–99)
NonHDL: 94.81
TRIGLYCERIDES: 91 mg/dL (ref 0.0–149.0)
Total CHOL/HDL Ratio: 2
VLDL: 18.2 mg/dL (ref 0.0–40.0)

## 2017-11-26 LAB — TSH: TSH: 2.22 u[IU]/mL (ref 0.35–4.50)

## 2017-11-26 LAB — HEMOGLOBIN A1C: Hgb A1c MFr Bld: 5.6 % (ref 4.6–6.5)

## 2017-11-26 LAB — PSA: PSA: 1.5 ng/mL (ref 0.10–4.00)

## 2017-11-26 MED ORDER — LISINOPRIL-HYDROCHLOROTHIAZIDE 20-25 MG PO TABS: 1.0000 | ORAL_TABLET | Freq: Every day | ORAL | 1 refills | Status: DC

## 2017-11-26 MED ORDER — ATORVASTATIN CALCIUM 10 MG PO TABS: 10.0000 mg | ORAL_TABLET | Freq: Every day | ORAL | 1 refills | Status: DC

## 2017-11-26 MED ORDER — AMLODIPINE BESYLATE 10 MG PO TABS: 10.0000 mg | ORAL_TABLET | Freq: Every day | ORAL | 0 refills | Status: DC

## 2017-11-26 MED ORDER — LEVOTHYROXINE SODIUM 88 MCG PO TABS
ORAL_TABLET | ORAL | 1 refills | Status: DC
Start: 2017-11-26 — End: 2018-05-06

## 2017-11-26 MED ORDER — AMLODIPINE BESYLATE 10 MG PO TABS: 5.0000 mg | ORAL_TABLET | Freq: Every day | ORAL | 0 refills | Status: DC

## 2017-11-26 NOTE — Assessment & Plan Note (Signed)
Encouraged heart healthy diet, increase exercise, avoid trans fats, consider a krill oil cap daily 

## 2017-11-26 NOTE — Assessment & Plan Note (Signed)
Asymptomatic. 

## 2017-11-26 NOTE — Assessment & Plan Note (Signed)
Well controlled, no changes to meds. Encouraged heart healthy diet such as the DASH diet and exercise as tolerated.  °

## 2017-11-26 NOTE — Assessment & Plan Note (Signed)
hgba1c acceptable, minimize simple carbs. Increase exercise as tolerated.  

## 2017-11-26 NOTE — Assessment & Plan Note (Signed)
Urology requests a repeat PSA due to labile numbers.

## 2017-11-26 NOTE — Patient Instructions (Signed)
Shingrix is the new shingles shot, 2 shots over 2-6 months check with insurance regarding coverage and then call for nurse appointment Cholesterol Cholesterol is a white, waxy, fat-like substance that is needed by the human body in small amounts. The liver makes all the cholesterol we need. Cholesterol is carried from the liver by the blood through the blood vessels. Deposits of cholesterol (plaques) may build up on blood vessel (artery) walls. Plaques make the arteries narrower and stiffer. Cholesterol plaques increase the risk for heart attack and stroke. You cannot feel your cholesterol level even if it is very high. The only way to know that it is high is to have a blood test. Once you know your cholesterol levels, you should keep a record of the test results. Work with your health care provider to keep your levels in the desired range. What do the results mean?  Total cholesterol is a rough measure of all the cholesterol in your blood.  LDL (low-density lipoprotein) is the "bad" cholesterol. This is the type that causes plaque to build up on the artery walls. You want this level to be low.  HDL (high-density lipoprotein) is the "good" cholesterol because it cleans the arteries and carries the LDL away. You want this level to be high.  Triglycerides are fat that the body can either burn for energy or store. High levels are closely linked to heart disease. What are the desired levels of cholesterol?  Total cholesterol below 200.  LDL below 100 for people who are at risk, below 70 for people at very high risk.  HDL above 40 is good. A level of 60 or higher is considered to be protective against heart disease.  Triglycerides below 150. How can I lower my cholesterol? Diet Follow your diet program as told by your health care provider.  Choose fish or white meat chicken and [place], roasted or baked. Limit fatty cuts of red meat, fried foods, and processed meats, such as sausage and lunch  meats.  Eat lots of fresh fruits and vegetables.  Choose whole grains, beans, pasta, potatoes, and cereals.  Choose olive oil, corn oil, or canola oil, and use only small amounts.  Avoid butter, mayonnaise, shortening, or palm kernel oils.  Avoid foods with trans fats.  Drink skim or nonfat milk and eat low-fat or nonfat yogurt and cheeses. Avoid whole milk, cream, ice cream, egg yolks, and full-fat cheeses.  Healthier desserts include angel food cake, ginger snaps, animal crackers, hard candy, popsicles, and low-fat or nonfat frozen yogurt. Avoid pastries, cakes, pies, and cookies.  Exercise  Follow your exercise program as told by your health care provider. A regular program: ? Helps to decrease LDL and raise HDL. ? Helps with weight control.  Do things that increase your activity level, such as gardening, walking, and taking the stairs.  Ask your health care provider about ways that you can be more active in your daily life.  Medicine  Take over-the-counter and prescription medicines only as told by your health care provider. ? Medicine may be prescribed by your health care provider to help lower cholesterol and decrease the risk for heart disease. This is usually done if diet and exercise have failed to bring down cholesterol levels. ? If you have several risk factors, you may need medicine even if your levels are normal.  This information is not intended to replace advice given to you by your health care provider. Make sure you discuss any questions you have with your  health care provider. Document Released: 09/13/2000 Document Revised: 07/17/2015 Document Reviewed: 06/19/2015 Elsevier Interactive Patient Education  [employer].

## 2017-11-28 NOTE — Progress Notes (Signed)
Subjective:    Patient ID: Zachary Rowe, male    DOB: 1955/04/03, 62 y.o.   MRN: [id number]  No chief complaint on file.   HPI Patient is in today for follow up and overall he feels well. No recent febrile illness or hospitalizations. He is staying active and tries to maintain a heart healthy diet. No acute concerns noted. No polyuria or polydipsia. Denies CP/palp/SOB/HA/congestion/fevers/GI or GU c/o. Taking meds as prescribed  Past Medical History:  Diagnosis Date  . 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 Back pain 04/02/2014  . Bilateral carotid bruits 11/07/2016  . Bradycardia 08/06/2016  . Chicken pox as a child  . Chronic sinusitis 01-03-2000  . Constipation 08/06/2016  . Depression with anxiety 05/07/2016  . Elevated BP 09/02/2010  . Fatigue 11/01/2010  . Hard of hearing    wears bilateral hearing aids  . History of chicken pox 09/02/2010  . History of measles 09/02/2010  . HTN (hypertension) 09/02/2010  . Hyperglycemia 08/04/2016  . Hyperlipidemia   . Hypertension   . Measles as a child  . Multiple allergies 09/02/2010  . Overweight(278.02) 09/02/2010  . Personal history of colonic polyps - adenoma 04/17/2007   04/2007 - 3 sigmoid polyps - at least 1 tubular adenoma  . Peyronie disease 10/14/2012  . Preventative health care 11/17/2012  . Raynaud disease 04/11/2011  . Thyroid disease     Past Surgical History:  Procedure Laterality Date  . COLONOSCOPY    . SKIN BIOPSY     BB in forehead    Family History  Problem Relation Age of Onset  . Hypertension Mother   . Diabetes Mother        borderline  . Neuropathy Mother   . Macular degeneration Mother   . COPD Mother        recent pneumonia  . Cancer Mother        lung, refused chemo, tolerated radiation  . Lung cancer Mother   . Hypertension Father   . Diabetes Father        type 2  . Cancer Father 35       liver  . Liver cancer Father   . Heart disease Father   . Diabetes Maternal Grandmother   . Parkinson's  disease Maternal Grandmother   . Emphysema Maternal Grandfather   . COPD Maternal Grandfather   . Stroke Paternal Grandfather   . Cancer Maternal Uncle        prostate cancer  . Colon cancer Neg Hx   . Esophageal cancer Neg Hx   . Rectal cancer Neg Hx   . Stomach cancer Neg Hx     Social History   Socioeconomic History  . Marital status: Married    Spouse name: Not on file  . Number of children: Not on file  . Years of education: Not on file  . Highest education level: Not on file  Occupational History  . Not on file  Social Needs  . Financial resource strain: Not on file  . Food insecurity:    Worry: Not on file    Inability: Not on file  . Transportation needs:    Medical: Not on file    Non-medical: Not on file  Tobacco Use  . Smoking status: Former Smoker    Types: Cigarettes    Last attempt to quit: 01/03/2007    Years since quitting: 10.9  . Smokeless tobacco: Current User    Types: Chew  . Tobacco comment:  dip  Substance and Sexual Activity  . Alcohol use: Yes    Alcohol/week: 15.0 - 20.0 standard drinks    Types: 15 - 20 Glasses of wine per week    Comment: occas drinks Scotch  . Drug use: No  . Sexual activity: Yes    Partners: Female    Comment: lives with wife  Lifestyle  . Physical activity:    Days per week: Not on file    Minutes per session: Not on file  . Stress: Not on file  Relationships  . Social connections:    Talks on phone: Not on file    Gets together: Not on file    Attends religious service: Not on file    Active member of club or organization: Not on file    Attends meetings of clubs or organizations: Not on file    Relationship status: Not on file  . Intimate partner violence:    Fear of current or ex partner: Not on file    Emotionally abused: Not on file    Physically abused: Not on file    Forced sexual activity: Not on file  Other Topics Concern  . Not on file  Social History Narrative  . Not on file    Outpatient  Medications Prior to Visit  Medication Sig Dispense Refill  . Azelastine HCl 0.15 % SOLN USE 2 SPRAYS ONCE DAILY AS NEEDED 30 mL 3  . folic acid (FOLVITE) 993 MCG tablet Take 1 tablet (400 mcg total) by mouth daily. 30 tablet 3  . Thiamine HCl (VITAMIN B-1) 250 MG tablet Take 1 tablet (250 mg total) by mouth daily as needed. 30 tablet 6  . amLODipine (NORVASC) 10 MG tablet Take 1 tablet (10 mg total) by mouth daily. NEED OV. 90 tablet 0  . atorvastatin (LIPITOR) 10 MG tablet TAKE 1 TABLET BY MOUTH  DAILY 90 tablet 0  . levothyroxine (SYNTHROID, LEVOTHROID) 88 MCG tablet TAKE 1 TABLET BY MOUTH ONCE DAILY BEFORE BREAKFAST 90 tablet 1  . lisinopril-hydrochlorothiazide (PRINZIDE,ZESTORETIC) 20-25 MG tablet TAKE 1 TABLET BY MOUTH ONCE DAILY 90 tablet 0   No facility-administered medications prior to visit.     No Known Allergies  Review of Systems  Constitutional: Negative for fever and malaise/fatigue.  HENT: Negative for congestion.   Eyes: Negative for blurred vision.  Respiratory: Negative for shortness of breath.   Cardiovascular: Negative for chest pain, palpitations and leg swelling.  Gastrointestinal: Negative for abdominal pain, blood in stool and nausea.  Genitourinary: Negative for dysuria and frequency.  Musculoskeletal: Negative for falls.  Skin: Negative for rash.  Neurological: Negative for dizziness, loss of consciousness and headaches.  Endo/Heme/Allergies: Negative for environmental allergies.  Psychiatric/Behavioral: Negative for depression. The patient is not nervous/anxious.        Objective:    Physical Exam  Constitutional: He is oriented to person, place, and time. He appears well-developed and well-nourished. No distress.  HENT:  Head: Normocephalic and atraumatic.  Nose: Nose normal.  Eyes: Right eye exhibits no discharge. Left eye exhibits no discharge.  Neck: Normal range of motion. Neck supple.  Cardiovascular: Normal rate and regular rhythm.  No  murmur heard. Pulmonary/Chest: Effort normal and breath sounds normal.  Abdominal: Soft. Bowel sounds are normal. There is no tenderness.  Musculoskeletal: He exhibits no edema.  Neurological: He is alert and oriented to person, place, and time.  Skin: Skin is warm and dry.  Psychiatric: He has a normal mood and affect.  Nursing note and vitals reviewed.   BP (!) 90/58 (BP Location: Left Arm, Patient Position: Sitting, Cuff Size: Normal)   Pulse (!) 47   Temp 97.8 F (36.6 C) (Oral)   Resp 18   Wt 228 lb (103.4 kg)   SpO2 96%   BMI 28.50 kg/m  Wt Readings from Last 3 Encounters:  11/26/17 228 lb (103.4 kg)  05/24/17 223 lb 6.4 oz (101.3 kg)  11/07/16 231 lb 3.2 oz (104.9 kg)     Lab Results  Component Value Date   WBC 5.1 11/26/2017   HGB 14.6 11/26/2017   HCT 42.6 11/26/2017   PLT 283.0 11/26/2017   GLUCOSE 91 11/26/2017   CHOL 163 11/26/2017   TRIG 91.0 11/26/2017   HDL 68.50 11/26/2017   LDLDIRECT 134.4 09/08/2010   LDLCALC 77 11/26/2017   ALT 85 (H) 11/26/2017   AST 64 (H) 11/26/2017   NA 141 11/26/2017   K 4.7 11/26/2017   CL 101 11/26/2017   CREATININE 1.59 (H) 11/26/2017   BUN 25 (H) 11/26/2017   CO2 28 11/26/2017   TSH 2.22 11/26/2017   PSA 1.50 11/26/2017   HGBA1C 5.6 11/26/2017    Lab Results  Component Value Date   TSH 2.22 11/26/2017   Lab Results  Component Value Date   WBC 5.1 11/26/2017   HGB 14.6 11/26/2017   HCT 42.6 11/26/2017   MCV 97.9 11/26/2017   PLT 283.0 11/26/2017   Lab Results  Component Value Date   NA 141 11/26/2017   K 4.7 11/26/2017   CO2 28 11/26/2017   GLUCOSE 91 11/26/2017   BUN 25 (H) 11/26/2017   CREATININE 1.59 (H) 11/26/2017   BILITOT 0.4 11/26/2017   ALKPHOS 49 11/26/2017   AST 64 (H) 11/26/2017   ALT 85 (H) 11/26/2017   PROT 7.0 11/26/2017   ALBUMIN 4.4 11/26/2017   CALCIUM 9.5 11/26/2017   GFR 46.97 (L) 11/26/2017   Lab Results  Component Value Date   CHOL 163 11/26/2017   Lab Results    Component Value Date   HDL 68.50 11/26/2017   Lab Results  Component Value Date   LDLCALC 77 11/26/2017   Lab Results  Component Value Date   TRIG 91.0 11/26/2017   Lab Results  Component Value Date   CHOLHDL 2 11/26/2017   Lab Results  Component Value Date   HGBA1C 5.6 11/26/2017       Assessment & Plan:   Problem List Items Addressed This Visit    HTN (hypertension)    Well controlled, no changes to meds. Encouraged heart healthy diet such as the DASH diet and exercise as tolerated.       Relevant Medications   lisinopril-hydrochlorothiazide (PRINZIDE,ZESTORETIC) 20-25 MG tablet   atorvastatin (LIPITOR) 10 MG tablet   amLODipine (NORVASC) 10 MG tablet   Other Relevant Orders   CBC (Completed)   Comprehensive metabolic panel (Completed)   TSH (Completed)   Hyperlipidemia    Encouraged heart healthy diet, increase exercise, avoid trans fats, consider a krill oil cap daily      Relevant Medications   lisinopril-hydrochlorothiazide (PRINZIDE,ZESTORETIC) 20-25 MG tablet   atorvastatin (LIPITOR) 10 MG tablet   amLODipine (NORVASC) 10 MG tablet   Other Relevant Orders   Lipid panel (Completed)   Peyronie disease    Urology requests a repeat PSA due to labile numbers.       Relevant Orders   PSA (Completed)   Hyperglycemia    hgba1c acceptable, minimize simple  carbs. Increase exercise as tolerated.       Relevant Orders   Hemoglobin A1c (Completed)   Bradycardia    Asymptomatic         I have discontinued Devery L. Ipock's amLODipine. I have also changed his lisinopril-hydrochlorothiazide, atorvastatin, and amLODipine. Additionally, I am having him maintain his vitamin B-1, folic acid, Azelastine HCl, and levothyroxine.  Meds ordered this encounter  Medications  . lisinopril-hydrochlorothiazide (PRINZIDE,ZESTORETIC) 20-25 MG tablet    Sig: Take 1 tablet by mouth daily.    Dispense:  90 tablet    Refill:  1  . DISCONTD: amLODipine (NORVASC) 10 MG  tablet    Sig: Take 1 tablet (10 mg total) by mouth daily.    Dispense:  90 tablet    Refill:  0  . atorvastatin (LIPITOR) 10 MG tablet    Sig: Take 1 tablet (10 mg total) by mouth daily.    Dispense:  90 tablet    Refill:  1  . levothyroxine (SYNTHROID, LEVOTHROID) 88 MCG tablet    Sig: TAKE 1 TABLET BY MOUTH ONCE DAILY BEFORE BREAKFAST    Dispense:  90 tablet    Refill:  1  . amLODipine (NORVASC) 10 MG tablet    Sig: Take 0.5 tablets (5 mg total) by mouth daily.    Dispense:  90 tablet    Refill:  0     Penni Homans, MD

## 2017-12-10 ENCOUNTER — Encounter: Payer: Self-pay | Admitting: Family Medicine

## 2017-12-21 ENCOUNTER — Encounter: Payer: Self-pay | Admitting: Family Medicine

## 2017-12-21 DIAGNOSIS — I10 Essential (primary) hypertension: Secondary | ICD-10-CM

## 2017-12-25 MED ORDER — AMLODIPINE BESYLATE 10 MG PO TABS: 10.0000 mg | ORAL_TABLET | Freq: Every day | ORAL | 0 refills | Status: DC

## 2017-12-27 ENCOUNTER — Other Ambulatory Visit (INDEPENDENT_AMBULATORY_CARE_PROVIDER_SITE_OTHER): Payer: BLUE CROSS/BLUE SHIELD

## 2017-12-27 DIAGNOSIS — I10 Essential (primary) hypertension: Secondary | ICD-10-CM | POA: Diagnosis not present

## 2017-12-27 LAB — COMPREHENSIVE METABOLIC PANEL
ALT: 73 U/L — AB (ref 0–53)
AST: 57 U/L — ABNORMAL HIGH (ref 0–37)
Albumin: 4.7 g/dL (ref 3.5–5.2)
Alkaline Phosphatase: 43 U/L (ref 39–117)
BUN: 15 mg/dL (ref 6–23)
CO2: 28 meq/L (ref 19–32)
Calcium: 9.7 mg/dL (ref 8.4–10.5)
Chloride: 97 mEq/L (ref 96–112)
Creatinine, Ser: 1.22 mg/dL (ref 0.40–1.50)
GFR: 63.75 mL/min (ref >60.00)
GLUCOSE: 113 mg/dL — AB (ref 70–99)
POTASSIUM: 4 meq/L (ref 3.5–5.1)
SODIUM: 136 meq/L (ref 135–145)
TOTAL PROTEIN: 7.3 g/dL (ref 6.0–8.3)
Total Bilirubin: 1 mg/dL (ref 0.2–1.2)

## 2018-03-21 ENCOUNTER — Encounter: Payer: Self-pay | Admitting: Family Medicine

## 2018-03-28 ENCOUNTER — Other Ambulatory Visit: Payer: Self-pay

## 2018-03-28 ENCOUNTER — Encounter: Payer: Self-pay | Admitting: Family Medicine

## 2018-03-28 ENCOUNTER — Ambulatory Visit (INDEPENDENT_AMBULATORY_CARE_PROVIDER_SITE_OTHER): Payer: BLUE CROSS/BLUE SHIELD | Admitting: Family Medicine

## 2018-03-28 DIAGNOSIS — R972 Elevated prostate specific antigen [PSA]: Secondary | ICD-10-CM | POA: Diagnosis not present

## 2018-03-28 DIAGNOSIS — M1990 Unspecified osteoarthritis, unspecified site: Secondary | ICD-10-CM

## 2018-03-28 DIAGNOSIS — R351 Nocturia: Secondary | ICD-10-CM | POA: Insufficient documentation

## 2018-03-28 DIAGNOSIS — E7800 Pure hypercholesterolemia, unspecified: Secondary | ICD-10-CM

## 2018-03-28 DIAGNOSIS — R7989 Other specified abnormal findings of blood chemistry: Secondary | ICD-10-CM

## 2018-03-28 DIAGNOSIS — E079 Disorder of thyroid, unspecified: Secondary | ICD-10-CM | POA: Diagnosis not present

## 2018-03-28 DIAGNOSIS — R739 Hyperglycemia, unspecified: Secondary | ICD-10-CM | POA: Diagnosis not present

## 2018-03-28 DIAGNOSIS — I10 Essential (primary) hypertension: Secondary | ICD-10-CM | POA: Diagnosis not present

## 2018-03-28 DIAGNOSIS — E785 Hyperlipidemia, unspecified: Secondary | ICD-10-CM

## 2018-03-28 NOTE — Assessment & Plan Note (Signed)
Encouraged heart healthy diet, increase exercise, avoid trans fats, consider a krill oil cap daily 

## 2018-03-28 NOTE — Assessment & Plan Note (Signed)
Well controlled, no changes to meds. Encouraged heart healthy diet such as the DASH diet and exercise as tolerated.  °

## 2018-03-28 NOTE — Progress Notes (Signed)
Virtual Visit via Telephone Note  I connected with Zachary Rowe on 03/28/18 at  9:45 AM EDT by telephone and verified that I am speaking with the correct person using two identifiers.   I discussed the limitations, risks, security and privacy concerns of performing an evaluation and management service by telephone and the availability of in person appointments. I also discussed with the patient that there may be a patient responsible charge related to this service. The patient expressed understanding and agreed to proceed. Zachary Rowe, [place].       Subjective:    Patient ID: Zachary Rowe, male    DOB: 30-Jul-1955, 63 y.o.   MRN: [id number]  No chief complaint on file.    History of Present Illness: Patient feels well. No recent febrile illness or hospitalizations. He is having worse trouble with pain and stiffness in his hands. No redness, swelling or stiffness. No injury. Topical lidocaine and copper gloves help temporarily. His Blood pressure has been well controlled. No new concerns otherwise. Denies CP/palp/SOB/HA/congestion/fevers/GI or GU c/o. Taking meds as prescribed  Past Medical History:  Diagnosis Date  . 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 Back pain 04/02/2014  . Bilateral carotid bruits 11/07/2016  . Bradycardia 08/06/2016  . Chicken pox as a child  . Chronic sinusitis 01-03-2000  . Constipation 08/06/2016  . Depression with anxiety 05/07/2016  . Elevated BP 09/02/2010  . Fatigue 11/01/2010  . Hard of hearing    wears bilateral hearing aids  . History of chicken pox 09/02/2010  . History of measles 09/02/2010  . HTN (hypertension) 09/02/2010  . Hyperglycemia 08/04/2016  . Hyperlipidemia   . Hypertension   . Measles as a child  . Multiple allergies 09/02/2010  . Overweight(278.02) 09/02/2010  . Personal history of colonic polyps - adenoma 04/17/2007   04/2007 - 3 sigmoid polyps - at least 1 tubular adenoma  . Peyronie disease 10/14/2012  . Preventative health care  11/17/2012  . Raynaud disease 04/11/2011  . Thyroid disease     Past Surgical History:  Procedure Laterality Date  . COLONOSCOPY    . SKIN BIOPSY     BB in forehead    Family History  Problem Relation Age of Onset  . Hypertension Mother   . Diabetes Mother        borderline  . Neuropathy Mother   . Macular degeneration Mother   . COPD Mother        recent pneumonia  . Cancer Mother        lung, refused chemo, tolerated radiation  . Lung cancer Mother   . Hypertension Father   . Diabetes Father        type 2  . Cancer Father 15       liver  . Liver cancer Father   . Heart disease Father   . Diabetes Maternal Grandmother   . Parkinson's disease Maternal Grandmother   . Emphysema Maternal Grandfather   . COPD Maternal Grandfather   . Stroke Paternal Grandfather   . Cancer Maternal Uncle        prostate cancer  . Colon cancer Neg Hx   . Esophageal cancer Neg Hx   . Rectal cancer Neg Hx   . Stomach cancer Neg Hx     Social History   Socioeconomic History  . Marital status: Married    Spouse name: Not on file  . Number of children: Not on file  . Years of education: Not on file  .  Highest education level: Not on file  Occupational History  . Not on file  Social Needs  . Financial resource strain: Not on file  . Food insecurity:    Worry: Not on file    Inability: Not on file  . Transportation needs:    Medical: Not on file    Non-medical: Not on file  Tobacco Use  . Smoking status: Former Smoker    Types: Cigarettes    Last attempt to quit: 01/03/2007    Years since quitting: 11.2  . Smokeless tobacco: Current User    Types: Chew  . Tobacco comment: dip  Substance and Sexual Activity  . Alcohol use: Yes    Alcohol/week: 15.0 - 20.0 standard drinks    Types: 15 - 20 Glasses of wine per week    Comment: occas drinks Scotch  . Drug use: No  . Sexual activity: Yes    Partners: Female    Comment: lives with wife  Lifestyle  . Physical activity:     Days per week: Not on file    Minutes per session: Not on file  . Stress: Not on file  Relationships  . Social connections:    Talks on phone: Not on file    Gets together: Not on file    Attends religious service: Not on file    Active member of club or organization: Not on file    Attends meetings of clubs or organizations: Not on file    Relationship status: Not on file  . Intimate partner violence:    Fear of current or ex partner: Not on file    Emotionally abused: Not on file    Physically abused: Not on file    Forced sexual activity: Not on file  Other Topics Concern  . Not on file  Social History Narrative  . Not on file    Outpatient Medications Prior to Visit  Medication Sig Dispense Refill  . amLODipine (NORVASC) 10 MG tablet Take 1 tablet (10 mg total) by mouth daily. 90 tablet 0  . atorvastatin (LIPITOR) 10 MG tablet Take 1 tablet (10 mg total) by mouth daily. 90 tablet 1  . Azelastine HCl 0.15 % SOLN USE 2 SPRAYS ONCE DAILY AS NEEDED 30 mL 3  . folic acid (FOLVITE) 941 MCG tablet Take 1 tablet (400 mcg total) by mouth daily. 30 tablet 3  . levothyroxine (SYNTHROID, LEVOTHROID) 88 MCG tablet TAKE 1 TABLET BY MOUTH ONCE DAILY BEFORE BREAKFAST 90 tablet 1  . lisinopril-hydrochlorothiazide (PRINZIDE,ZESTORETIC) 20-25 MG tablet Take 1 tablet by mouth daily. 90 tablet 1  . Thiamine HCl (VITAMIN B-1) 250 MG tablet Take 1 tablet (250 mg total) by mouth daily as needed. 30 tablet 6   No facility-administered medications prior to visit.     No Known Allergies  Review of Systems  Constitutional: Negative for fever and malaise/fatigue.  HENT: Negative for congestion.   Eyes: Negative for blurred vision.  Respiratory: Negative for shortness of breath.   Cardiovascular: Negative for chest pain, palpitations and leg swelling.  Gastrointestinal: Negative for abdominal pain, blood in stool and nausea.  Genitourinary: Negative for dysuria and frequency.  Musculoskeletal:  Positive for joint pain. Negative for falls.  Skin: Negative for rash.  Neurological: Negative for dizziness, loss of consciousness and headaches.  Endo/Heme/Allergies: Negative for environmental allergies.  Psychiatric/Behavioral: Negative for depression. The patient is not nervous/anxious.        Objective:    Physical Exam unable to perform due  to need to have virtual visit due to Covid 19  There were no vitals taken for this visit. Wt Readings from Last 3 Encounters:  11/26/17 228 lb (103.4 kg)  05/24/17 223 lb 6.4 oz (101.3 kg)  11/07/16 231 lb 3.2 oz (104.9 kg)    Diabetic Foot Exam - Simple   No data filed     Lab Results  Component Value Date   WBC 5.1 11/26/2017   HGB 14.6 11/26/2017   HCT 42.6 11/26/2017   PLT 283.0 11/26/2017   GLUCOSE 113 (H) 12/27/2017   CHOL 163 11/26/2017   TRIG 91.0 11/26/2017   HDL 68.50 11/26/2017   LDLDIRECT 134.4 09/08/2010   LDLCALC 77 11/26/2017   ALT 73 (H) 12/27/2017   AST 57 (H) 12/27/2017   NA 136 12/27/2017   K 4.0 12/27/2017   CL 97 12/27/2017   CREATININE 1.22 12/27/2017   BUN 15 12/27/2017   CO2 28 12/27/2017   TSH 2.22 11/26/2017   PSA 1.50 11/26/2017   HGBA1C 5.6 11/26/2017    Lab Results  Component Value Date   TSH 2.22 11/26/2017   Lab Results  Component Value Date   WBC 5.1 11/26/2017   HGB 14.6 11/26/2017   HCT 42.6 11/26/2017   MCV 97.9 11/26/2017   PLT 283.0 11/26/2017   Lab Results  Component Value Date   NA 136 12/27/2017   K 4.0 12/27/2017   CO2 28 12/27/2017   GLUCOSE 113 (H) 12/27/2017   BUN 15 12/27/2017   CREATININE 1.22 12/27/2017   BILITOT 1.0 12/27/2017   ALKPHOS 43 12/27/2017   AST 57 (H) 12/27/2017   ALT 73 (H) 12/27/2017   PROT 7.3 12/27/2017   ALBUMIN 4.7 12/27/2017   CALCIUM 9.7 12/27/2017   GFR 63.75 12/27/2017   Lab Results  Component Value Date   CHOL 163 11/26/2017   Lab Results  Component Value Date   HDL 68.50 11/26/2017   Lab Results  Component Value Date    LDLCALC 77 11/26/2017   Lab Results  Component Value Date   TRIG 91.0 11/26/2017   Lab Results  Component Value Date   CHOLHDL 2 11/26/2017   Lab Results  Component Value Date   HGBA1C 5.6 11/26/2017       Assessment & Plan:   Problem List Items Addressed This Visit    Thyroid disease    On Levothyroxine, continue to monitor      HTN (hypertension)    Well controlled, no changes to meds. Encouraged heart healthy diet such as the DASH diet and exercise as tolerated.       Relevant Orders   CBC   Comprehensive metabolic panel   Hyperlipidemia    Encouraged heart healthy diet, increase exercise, avoid trans fats, consider a krill oil cap daily       Relevant Orders   Lipid panel   Hyperglycemia    hgba1c acceptable, minimize simple carbs. Increase exercise as tolerated.      Relevant Orders   Hemoglobin A1c   Sedimentation rate   Rheumatoid Factor   Antinuclear Antib (ANA)   Arthritis    Pain in hands worsening with weakness and stiffness so will proceed with sed rate, RF, ANA. Try Tylenol ES 500 mg twice daily and topical lidocaine. Will consider referral once labs are back      Nocturia    With some fluctuating PSA levels and some hesitancy at times.      Relevant Orders   PSA  Other Visit Diagnoses    Rising PSA level    -  Primary   Relevant Orders   PSA   Low testosterone       Relevant Orders   TSH      I am having Cheral Almas Mongeau maintain his vitamin B-1, folic acid, Azelastine HCl, lisinopril-hydrochlorothiazide, atorvastatin, levothyroxine, and amLODipine.  No orders of the defined types were placed in this encounter.    Penni Homans, MD  Follow Up Instructions:    I discussed the assessment and treatment plan with the patient. The patient was provided an opportunity to ask questions and all were answered. The patient agreed with the plan and demonstrated an understanding of the instructions.   The patient was advised to call  back or seek an in-person evaluation if the symptoms worsen or if the condition fails to improve as anticipated.  I provided 15 minutes of non-face-to-face time during this encounter   Penni Homans, MD

## 2018-03-28 NOTE — Assessment & Plan Note (Signed)
On Levothyroxine, continue to monitor 

## 2018-03-28 NOTE — Assessment & Plan Note (Signed)
hgba1c acceptable, minimize simple carbs. Increase exercise as tolerated.  

## 2018-03-28 NOTE — Assessment & Plan Note (Signed)
With some fluctuating PSA levels and some hesitancy at times.

## 2018-03-28 NOTE — Assessment & Plan Note (Addendum)
Pain in hands worsening with weakness and stiffness so will proceed with sed rate, RF, ANA. Try Tylenol ES 500 mg twice daily and topical lidocaine. Will consider referral once labs are back

## 2018-04-02 ENCOUNTER — Other Ambulatory Visit (INDEPENDENT_AMBULATORY_CARE_PROVIDER_SITE_OTHER): Payer: BLUE CROSS/BLUE SHIELD

## 2018-04-02 ENCOUNTER — Other Ambulatory Visit: Payer: Self-pay

## 2018-04-02 DIAGNOSIS — R739 Hyperglycemia, unspecified: Secondary | ICD-10-CM | POA: Diagnosis not present

## 2018-04-02 DIAGNOSIS — R351 Nocturia: Secondary | ICD-10-CM

## 2018-04-02 DIAGNOSIS — R7989 Other specified abnormal findings of blood chemistry: Secondary | ICD-10-CM

## 2018-04-02 DIAGNOSIS — E785 Hyperlipidemia, unspecified: Secondary | ICD-10-CM

## 2018-04-02 DIAGNOSIS — I10 Essential (primary) hypertension: Secondary | ICD-10-CM | POA: Diagnosis not present

## 2018-04-02 DIAGNOSIS — R972 Elevated prostate specific antigen [PSA]: Secondary | ICD-10-CM

## 2018-04-02 LAB — CBC
HCT: 41.4 % (ref 39.0–52.0)
Hemoglobin: 14.3 g/dL (ref 13.0–17.0)
MCHC: 34.6 g/dL (ref 30.0–36.0)
MCV: 99.4 fl (ref 78.0–100.0)
PLATELETS: 262 10*3/uL (ref 150.0–400.0)
RBC: 4.16 Mil/uL — ABNORMAL LOW (ref 4.22–5.81)
RDW: 13.4 % (ref 11.5–15.5)
WBC: 4.4 10*3/uL (ref 4.0–10.5)

## 2018-04-02 LAB — LIPID PANEL
CHOLESTEROL: 209 mg/dL — AB (ref 0–200)
HDL: 73.4 mg/dL (ref >39.00)
LDL Cholesterol: 115 mg/dL — ABNORMAL HIGH (ref 0–99)
NonHDL: 135.97
TRIGLYCERIDES: 104 mg/dL (ref 0.0–149.0)
Total CHOL/HDL Ratio: 3
VLDL: 20.8 mg/dL (ref 0.0–40.0)

## 2018-04-02 LAB — COMPREHENSIVE METABOLIC PANEL
ALT: 111 U/L — ABNORMAL HIGH (ref 0–53)
AST: 69 U/L — ABNORMAL HIGH (ref 0–37)
Albumin: 4.8 g/dL (ref 3.5–5.2)
Alkaline Phosphatase: 42 U/L (ref 39–117)
BUN: 22 mg/dL (ref 6–23)
CALCIUM: 9.5 mg/dL (ref 8.4–10.5)
CO2: 27 mEq/L (ref 19–32)
Chloride: 99 mEq/L (ref 96–112)
Creatinine, Ser: 1.47 mg/dL (ref 0.40–1.50)
GFR: 48.33 mL/min — AB (ref >60.00)
Glucose, Bld: 85 mg/dL (ref 70–99)
Potassium: 3.8 mEq/L (ref 3.5–5.1)
Sodium: 138 mEq/L (ref 135–145)
Total Bilirubin: 0.7 mg/dL (ref 0.2–1.2)
Total Protein: 7.4 g/dL (ref 6.0–8.3)

## 2018-04-02 LAB — PSA: PSA: 0.59 ng/mL (ref 0.10–4.00)

## 2018-04-02 LAB — SEDIMENTATION RATE: Sed Rate: 4 mm/hr (ref 0–20)

## 2018-04-02 LAB — TSH: TSH: 3.09 u[IU]/mL (ref 0.35–4.50)

## 2018-04-02 LAB — HEMOGLOBIN A1C: Hgb A1c MFr Bld: 5.8 % (ref 4.6–6.5)

## 2018-04-03 ENCOUNTER — Other Ambulatory Visit: Payer: Self-pay | Admitting: Family Medicine

## 2018-04-03 LAB — RHEUMATOID FACTOR: Rheumatoid fact SerPl-aCnc: 15 IU/mL — ABNORMAL HIGH (ref <14)

## 2018-04-03 LAB — ANA: Anti Nuclear Antibody (ANA): NEGATIVE

## 2018-04-04 ENCOUNTER — Encounter: Payer: Self-pay | Admitting: Family Medicine

## 2018-04-29 ENCOUNTER — Encounter: Payer: Self-pay | Admitting: Internal Medicine

## 2018-05-05 ENCOUNTER — Other Ambulatory Visit: Payer: Self-pay | Admitting: Family Medicine

## 2018-05-07 ENCOUNTER — Ambulatory Visit (INDEPENDENT_AMBULATORY_CARE_PROVIDER_SITE_OTHER): Payer: BLUE CROSS/BLUE SHIELD | Admitting: Family

## 2018-05-07 ENCOUNTER — Other Ambulatory Visit: Payer: Self-pay

## 2018-05-07 DIAGNOSIS — R55 Syncope and collapse: Secondary | ICD-10-CM | POA: Diagnosis not present

## 2018-05-07 DIAGNOSIS — I10 Essential (primary) hypertension: Secondary | ICD-10-CM | POA: Diagnosis not present

## 2018-05-07 LAB — COMPREHENSIVE METABOLIC PANEL
AG Ratio: 2.2 (calc) (ref 1.0–2.5)
ALT: 128 U/L — ABNORMAL HIGH (ref 9–46)
AST: 95 U/L — ABNORMAL HIGH (ref 10–35)
Albumin: 4.8 g/dL (ref 3.6–5.1)
Alkaline phosphatase (APISO): 43 U/L (ref 35–144)
BUN/Creatinine Ratio: 15 (calc) (ref 6–22)
BUN: 28 mg/dL — ABNORMAL HIGH (ref 7–25)
CO2: 28 mmol/L (ref 20–32)
Calcium: 9.8 mg/dL (ref 8.6–10.3)
Chloride: 98 mmol/L (ref 98–110)
Creat: 1.81 mg/dL — ABNORMAL HIGH (ref 0.70–1.25)
Globulin: 2.2 g/dL (calc) (ref 1.9–3.7)
Glucose, Bld: 116 mg/dL — ABNORMAL HIGH (ref 65–99)
Potassium: 4 mmol/L (ref 3.5–5.3)
Sodium: 137 mmol/L (ref 135–146)
Total Bilirubin: 0.5 mg/dL (ref 0.2–1.2)
Total Protein: 7 g/dL (ref 6.1–8.1)

## 2018-05-07 LAB — CBC WITH DIFFERENTIAL/PLATELET
Absolute Monocytes: 584 cells/uL (ref 200–950)
Basophils Absolute: 28 cells/uL (ref 0–200)
Basophils Relative: 0.6 %
Eosinophils Absolute: 189 cells/uL (ref 15–500)
Eosinophils Relative: 4.1 %
HCT: 40 % (ref 38.5–50.0)
Hemoglobin: 14.5 g/dL (ref 13.2–17.1)
Lymphs Abs: 1072 cells/uL (ref 850–3900)
MCH: 34.9 pg — ABNORMAL HIGH (ref 27.0–33.0)
MCHC: 36.3 g/dL — ABNORMAL HIGH (ref 32.0–36.0)
MCV: 96.4 fL (ref 80.0–100.0)
MPV: 8.9 fL (ref 7.5–12.5)
Monocytes Relative: 12.7 %
Neutro Abs: 2728 cells/uL (ref 1500–7800)
Neutrophils Relative %: 59.3 %
Platelets: 268 10*3/uL (ref 140–400)
RBC: 4.15 10*6/uL — ABNORMAL LOW (ref 4.20–5.80)
RDW: 13 % (ref 11.0–15.0)
Total Lymphocyte: 23.3 %
WBC: 4.6 10*3/uL (ref 3.8–10.8)

## 2018-05-07 LAB — D-DIMER, QUANTITATIVE: D-Dimer, Quant: 0.38 mcg/mL FEU (ref <0.50)

## 2018-05-07 MED ORDER — AMLODIPINE BESYLATE 10 MG PO TABS: 5.0000 mg | ORAL_TABLET | Freq: Every day | ORAL | 0 refills | Status: DC

## 2018-05-07 NOTE — Patient Instructions (Addendum)
Please go to the ER if you develop recurrent loss of consciousness, chest pain, shortness of breath. Decrease amlodipine from 10mg  to 5mg .   Check blood pressure daily. Call if bp running >150/90.

## 2018-05-07 NOTE — Progress Notes (Signed)
Virtual Visit via Video Note  I connected with Zachary Rowe on 05/07/18 at  2:00 PM EDT by a video enabled telemedicine application and verified that I am speaking with the correct person using two identifiers. This visit type was conducted due to national recommendations for restrictions regarding the COVID-19 Pandemic (e.g. social distancing).  This format is felt to be most appropriate for this patient at this time.   I discussed the limitations of evaluation and management by telemedicine and the availability of in person appointments. The patient expressed understanding and agreed to proceed.  Only the patient and myself were on today's video visit. The patient was at home and I was in my office at the time of today's visit.   History of Present Illness:  Patient is a 63 yr old male with hx of hypothyroid, hyperlipidemia, hyperglycemia, and hypertension who presents today with report of syncopal episode yesterday.  He reports that about 1 year ago he was having a series of dizzy spells. He was referred to cardiology. Reports that he wore a  holter monitor which was negative. He also had an ETT which was a low risk study in 2018.  Reports that he has been dizzy more recently.  Yesterday he had a syncopal episode. Reports that he swatted a fly and then went to walk towards the back door and "passed out." Notes that he woke up and was able to catch himself before he actually hit the floor. Denies bowel or bladder incontinence. This episode was witnessed by his wife and there was no reported seizure activity. Reports that he has been checking his blood pressure regularly.  Yesterday afternoon 30 minutes after episode: BP was: 113/52 HR 60   yesterday evening: 156/82 HR 56   Earlier today: 124/66 hr 51 1:30 PM today- 149/97 hr 56.    He denies SOB/CP or palpitations.  No dizziness today.  He reports that dizziness is not usually positional.    Lab Results  Component Value Date   TSH 3.09  04/02/2018    Observations/Objective:   Gen: Awake, alert, no acute distress Resp: Breathing is even and non-labored Psych: calm/pleasant demeanor Neuro: Alert and Oriented x 3, + facial symmetry, speech is clear.    Assessment and Plan:  Near syncope- I would like to have him complete a CMET/CBC to assess for electrolyte imbalance, hyper/hypoglycemia and anemia.  I would also like for him to complete a D dimer to exclude PE. He understands that if the D dimer is elevated he will be asked to go to the ED for further evaluation.    HTN-  orthostasis is a possibility for his recent dizziness and near syncope.  His BP 30 minutes following the event was a bit low. I have advised him to cut his amlodipine in half (down to 5 mg) once daily and to continue to monitor his blood pressure at home.    I have asked him to come to the office to complete an EKG as well as labs today. He is advised to go to the ER if recurrent syncope/near syncope. Will also refer back to cardiology.    Addendum: EKG tracing is personally reviewed.  EKG notes NSR (sinus bradycardia).  No acute changes.   Follow Up Instructions:    I discussed the assessment and treatment plan with the patient. The patient was provided an opportunity to ask questions and all were answered. The patient agreed with the plan and demonstrated an understanding of the instructions.  The patient was advised to call back or seek an in-person evaluation if the symptoms worsen or if the condition fails to improve as anticipated.    Nance Pear, NP

## 2018-05-08 ENCOUNTER — Telehealth: Payer: Self-pay | Admitting: Family

## 2018-05-08 DIAGNOSIS — N289 Disorder of kidney and ureter, unspecified: Secondary | ICD-10-CM

## 2018-05-08 NOTE — Telephone Encounter (Signed)
Results given to patient, he was scheduled for May 19th for labs.

## 2018-05-08 NOTE — Telephone Encounter (Signed)
Please let pt know that I reviewed his chart and reviewed his results with Dr. Charlett Blake. I would like him to work on hydration, (avoid alcohol if he is drinking it regularly due to abnormal liver function testing) and repeat lab work in 1-2  weeks. CMET order has been placed.

## 2018-05-09 ENCOUNTER — Encounter: Payer: Self-pay | Admitting: Family

## 2018-05-21 ENCOUNTER — Other Ambulatory Visit: Payer: Self-pay

## 2018-05-21 ENCOUNTER — Telehealth: Payer: Self-pay | Admitting: Family

## 2018-05-21 ENCOUNTER — Other Ambulatory Visit (INDEPENDENT_AMBULATORY_CARE_PROVIDER_SITE_OTHER): Payer: BLUE CROSS/BLUE SHIELD

## 2018-05-21 DIAGNOSIS — N289 Disorder of kidney and ureter, unspecified: Secondary | ICD-10-CM | POA: Diagnosis not present

## 2018-05-21 LAB — COMPREHENSIVE METABOLIC PANEL
ALT: 133 U/L — ABNORMAL HIGH (ref 0–53)
AST: 85 U/L — ABNORMAL HIGH (ref 0–37)
Albumin: 4.4 g/dL (ref 3.5–5.2)
Alkaline Phosphatase: 39 U/L (ref 39–117)
BUN: 19 mg/dL (ref 6–23)
CO2: 31 mEq/L (ref 19–32)
Calcium: 9.3 mg/dL (ref 8.4–10.5)
Chloride: 98 mEq/L (ref 96–112)
Creatinine, Ser: 1.43 mg/dL (ref 0.40–1.50)
GFR: 49.87 mL/min — ABNORMAL LOW (ref >60.00)
Glucose, Bld: 92 mg/dL (ref 70–99)
Potassium: 3.3 mEq/L — ABNORMAL LOW (ref 3.5–5.1)
Sodium: 137 mEq/L (ref 135–145)
Total Bilirubin: 0.5 mg/dL (ref 0.2–1.2)
Total Protein: 7.1 g/dL (ref 6.0–8.3)

## 2018-05-21 NOTE — Telephone Encounter (Signed)
Contacted patient to discuss lab work.  Mildly low potassium. Advised pt to concentrate on increasing intake of high potassium foods. List sent via mychart.  I am hesitant to start a potassium supplement as his K+ historically has been high normal.    We discussed elevated LFT's since November.  He admits to "2-3 cocktails a night." Reports each drink is about 3 oz of ETOH.  Advised pt ideally to abstain from alcohol due to his liver abnormalities but if he is to consume ETOH to limit is consumption to no more than two 1.5 oz liquor beverages a day. He verbalizes understanding.  In regards to BP he reports that his bp readings have been quite variable.  Pt reports BP as low as 103/35 and as high as 169/70, often in 120-140/60-70. I advised him to continue current meds/dosing and to follow up with Dr. Charlett Blake in 4-6 weeks for bp check and repeat labs.   Dr. Charlett Blake, Juluis Rainier.  Sherri- please contact pt to arrange follow up with Dr. Charlett Blake as above.

## 2018-05-23 ENCOUNTER — Other Ambulatory Visit: Payer: Self-pay | Admitting: Family Medicine

## 2018-05-23 DIAGNOSIS — E785 Hyperlipidemia, unspecified: Secondary | ICD-10-CM

## 2018-05-23 NOTE — Telephone Encounter (Signed)
please release his recent labs to his mychart

## 2018-05-29 ENCOUNTER — Telehealth: Payer: Self-pay | Admitting: Cardiology

## 2018-05-29 NOTE — Telephone Encounter (Signed)
Smart phone/video visit/pre reg complete/consent obtained -- ttf

## 2018-05-30 ENCOUNTER — Encounter: Payer: Self-pay | Admitting: Internal Medicine

## 2018-06-03 ENCOUNTER — Encounter: Payer: Self-pay | Admitting: Cardiology

## 2018-06-03 ENCOUNTER — Telehealth: Payer: Self-pay

## 2018-06-03 ENCOUNTER — Telehealth (INDEPENDENT_AMBULATORY_CARE_PROVIDER_SITE_OTHER): Payer: BLUE CROSS/BLUE SHIELD | Admitting: Cardiology

## 2018-06-03 VITALS — BP 124/70 | HR 49 | Ht 75.0 in | Wt 219.0 lb

## 2018-06-03 DIAGNOSIS — I779 Disorder of arteries and arterioles, unspecified: Secondary | ICD-10-CM | POA: Insufficient documentation

## 2018-06-03 DIAGNOSIS — N183 Chronic kidney disease, stage 3 unspecified: Secondary | ICD-10-CM

## 2018-06-03 DIAGNOSIS — R55 Syncope and collapse: Secondary | ICD-10-CM

## 2018-06-03 DIAGNOSIS — I10 Essential (primary) hypertension: Secondary | ICD-10-CM | POA: Diagnosis not present

## 2018-06-03 DIAGNOSIS — I6523 Occlusion and stenosis of bilateral carotid arteries: Secondary | ICD-10-CM

## 2018-06-03 NOTE — Patient Instructions (Signed)
Medication Instructions:  TAKE Lisinopril HCTZ in the mornings If you need a refill on your cardiac medications before your next appointment, please call your pharmacy.   Lab work: None  If you have labs (blood work) drawn today and your tests are completely normal, you will receive your results only by: Marland Kitchen MyChart Message (if you have MyChart) OR . A paper copy in the mail If you have any lab test that is abnormal or we need to change your treatment, we will call you to review the results.  Testing/Procedures: None   Follow-Up: At [facility] you and your health needs are our priority.  As part of our continuing mission to provide you with exceptional heart care, we have created designated Provider Care Teams.  These Care Teams include your primary Cardiologist (physician) and Advanced Practice Providers (APPs -  Physician Assistants and Nurse Practitioners) who all work together to provide you with the care you need, when you need it. You will need a follow up appointment in 4 months.  Please call our office 2 months in advance to schedule this appointment.  You may see Skeet Latch, MD or one of the following Advanced Practice Providers on your designated Care Team:   [name], PA-C Roby Lofts, [place] . Sande Rives, PA-C  Any Other Special Instructions Will Be Listed Below (If Applicable).

## 2018-06-03 NOTE — Progress Notes (Signed)
Virtual Visit via Video Note   This visit type was conducted due to national recommendations for restrictions regarding the COVID-19 Pandemic (e.g. social distancing) in an effort to limit this patient's exposure and mitigate transmission in our community.  Due to his co-morbid illnesses, this patient is at least at moderate risk for complications without adequate follow up.  This format is felt to be most appropriate for this patient at this time.  All issues noted in this document were discussed and addressed.  A limited physical exam was performed with this format.  Please refer to the patient's chart for his consent to telehealth for [facility].   Date:  06/03/2018   ID:  Zachary Rowe, DOB March 16, 1955, MRN [id number]  Patient Location: Home Provider Location: Home  PCP:  Mosie Lukes, MD  Cardiologist:  Skeet Latch, MD  Electrophysiologist:  None   Evaluation Performed:  Follow-Up Visit  Chief Complaint:  Labile B/P  History of Present Illness:    Zachary Rowe is a 63 y.o. male with seen by Dr. Oval Linsey in the past.  He has a history of labile blood pressure.  He had a negative treadmill in 2018, a normal echocardiogram in 2012, and an unremarkable event monitor in 2018.  Carotid Dopplers done in November 2018 showed a less than 50% bilateral internal carotid artery narrowing.  He has chronic renal insufficiency stage III with a GFR of 49.  A few days ago he had a syncopal spell.  He said he was walking in from taking his dogs out and collapsed.  He had no warning.  He had no tachycardia or nausea beforehand.  He denies any injury from this event.  His primary care provider noted that he was at times hypotensive and cut his amlodipine from 10 mg a day to 5 mg a day.  When I reviewed the patient's medications with him he tells me he takes everything at night including his lisinopril HCTZ.  He has kept a record of his blood pressures at home, they run from 888 systolic to 916  systolic.  Interestingly his heart rate is always in the low 50s except for one occasion when it was 91.  On that occasion his blood pressure was also lowest at 945 systolic.  He denies any history of tachycardia or palpitations.  I am not sure if that was an errant reading.  He has had no further episodes since his medication was cut back.  The patient does not have symptoms concerning for COVID-19 infection (fever, chills, cough, or new shortness of breath).    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   Back pain 04/02/2014   Bilateral carotid bruits 11/07/2016   Bradycardia 08/06/2016   Chicken pox as a child   Chronic sinusitis 01-03-2000   Constipation 08/06/2016   Depression with anxiety 05/07/2016   Elevated BP 09/02/2010   Fatigue 11/01/2010   Hard of hearing    wears bilateral hearing aids   History of chicken pox 09/02/2010   History of measles 09/02/2010   HTN (hypertension) 09/02/2010   Hyperglycemia 08/04/2016   Hyperlipidemia    Hypertension    Measles as a child   Multiple allergies 09/02/2010   Overweight(278.02) 09/02/2010   Personal history of colonic polyps - adenoma 04/17/2007   04/2007 - 3 sigmoid polyps - at least 1 tubular adenoma   Peyronie disease 10/14/2012   Preventative health care 11/17/2012   Raynaud  disease 04/11/2011   Thyroid disease    Past Surgical History:  Procedure Laterality Date   COLONOSCOPY     SKIN BIOPSY     BB in forehead     Current Meds  Medication Sig   amLODipine (NORVASC) 10 MG tablet Take 0.5 tablets (5 mg total) by mouth daily.   atorvastatin (LIPITOR) 10 MG tablet TAKE 1 TABLET BY MOUTH  DAILY   Azelastine HCl 0.15 % SOLN USE 2 SPRAY(S) ONCE DAILY AS NEEDED   folic acid (FOLVITE) 992 MCG tablet Take 1 tablet (400 mcg total) by mouth daily.   levothyroxine (SYNTHROID) 88 MCG tablet TAKE 1 TABLET BY MOUTH ONCE DAILY BEFORE BREAKFAST   lisinopril-hydrochlorothiazide  (ZESTORETIC) 20-25 MG tablet TAKE 1 TABLET BY MOUTH  DAILY   Thiamine HCl (VITAMIN B-1) 250 MG tablet Take 1 tablet (250 mg total) by mouth daily as needed.     Allergies:   Patient has no known allergies.   Social History   Tobacco Use   Smoking status: Former Smoker    Types: Cigarettes    Last attempt to quit: 01/03/2007    Years since quitting: 11.4   Smokeless tobacco: Current User    Types: Chew   Tobacco comment: dip  Substance Use Topics   Alcohol use: Yes    Alcohol/week: 15.0 - 20.0 standard drinks    Types: 15 - 20 Glasses of wine per week    Comment: occas drinks Scotch   Drug use: No     Family Hx: The patient's family history includes COPD in his maternal grandfather and mother; Cancer in his maternal uncle and mother; Cancer (age of onset: 30) in his father; Diabetes in his father, maternal grandmother, and mother; Emphysema in his maternal grandfather; Heart disease in his father; Hypertension in his father and mother; Liver cancer in his father; Lung cancer in his mother; Macular degeneration in his mother; Neuropathy in his mother; Parkinson's disease in his maternal grandmother; Stroke in his paternal grandfather. There is no history of Colon cancer, Esophageal cancer, Rectal cancer, or Stomach cancer.  ROS:   Please see the history of present illness.    All other systems reviewed and are negative.   Prior CV studies:   The following studies were reviewed today: Echo 2012 ETT 2018 Monitor 2018  CA dopplers 2018  Labs/Other Tests and Data Reviewed:    EKG:  No ECG reviewed.  Recent Labs: 04/02/2018: TSH 3.09 05/07/2018: Hemoglobin 14.5; Platelets 268 05/21/2018: ALT 133; BUN 19; Creatinine, Ser 1.43; Potassium 3.3; Sodium 137   Recent Lipid Panel Lab Results  Component Value Date/Time   CHOL 209 (H) 04/02/2018 12:53 PM   TRIG 104.0 04/02/2018 12:53 PM   HDL 73.40 04/02/2018 12:53 PM   CHOLHDL 3 04/02/2018 12:53 PM   LDLCALC 115 (H) 04/02/2018  12:53 PM   LDLDIRECT 134.4 09/08/2010 08:26 AM    Wt Readings from Last 3 Encounters:  06/03/18 219 lb (99.3 kg)  11/26/17 228 lb (103.4 kg)  05/24/17 223 lb 6.4 oz (101.3 kg)     Objective:    Vital Signs:  BP 124/70    Pulse (!) 49    Ht 6\' 3"  (1.905 m)    Wt 219 lb (99.3 kg)    BMI 27.37 kg/m    VITAL SIGNS:  reviewed  ASSESSMENT & PLAN:    H/O syncope Possibly orthostatic though if he has another episode I would have him wear an Event Monitor.  HTN The  readings he gave me over the past few days did not look too bad- mainly elevatd in the afternoon.  I suggested he take his Lisinopril HTZ in the am.   CRI- 3 SCr 1.4, GFR 49  Carotid disease Less than 50% by doppler 2018  COVID-19 Education: The signs and symptoms of COVID-19 were discussed with the patient and how to seek care for testing (follow up with PCP or arrange E-visit).  The importance of social distancing was discussed today.  Time:   Today, I have spent 25 minutes with the patient with telehealth technology discussing the above problems.     Medication Adjustments/Labs and Tests Ordered: Current medicines are reviewed at length with the patient today.  Concerns regarding medicines are outlined above.   Tests Ordered: No orders of the defined types were placed in this encounter.   Medication Changes: No orders of the defined types were placed in this encounter.   Disposition:  Follow up Take lisinopril HCTZ in am.  If he has recurrent near syncope or syncope he will need an EKG and a monitor placed.  F/U with Dr Oval Linsey in the Fall.   Signed, [name], PA-C  06/03/2018 10:50 AM    [facility]

## 2018-06-03 NOTE — Telephone Encounter (Signed)
VERBAL CONSENT GIVEN TO TERESA FIELDS ON 05/29/2018 AT 10:01AM.      Virtual Visit Pre-Appointment Phone Call  "Zachary Rowe, I am calling you today to discuss your upcoming appointment. We are currently trying to limit exposure to the virus that causes COVID-19 by seeing patients at home rather than in the office."  1. "What is the BEST phone number to call the day of the visit?" - include this in appointment notes  2. "Do you have or have access to (through a family member/friend) a smartphone with video capability that we can use for your visit?" a. If yes - list this number in appt notes as "cell" (if different from BEST phone #) and list the appointment type as a VIDEO visit in appointment notes b. If no - list the appointment type as a PHONE visit in appointment notes  3. Confirm consent - "In the setting of the current Covid19 crisis, you are scheduled for a (phone or video) visit with your provider on (date) at (time).  Just as we do with many in-office visits, in order for you to participate in this visit, we must obtain consent.  If you'd like, I can send this to your mychart (if signed up) or email for you to review.  Otherwise, I can obtain your verbal consent now.  All virtual visits are billed to your insurance company just like a normal visit would be.  By agreeing to a virtual visit, we'd like you to understand that the technology does not allow for your provider to perform an examination, and thus may limit your provider's ability to fully assess your condition. If your provider identifies any concerns that need to be evaluated in person, we will make arrangements to do so.  Finally, though the technology is pretty good, we cannot assure that it will always work on either your or our end, and in the setting of a video visit, we may have to convert it to a phone-only visit.  In either situation, we cannot ensure that we have a secure connection.  Are you willing to proceed?" STAFF: Did the  patient verbally acknowledge consent to telehealth visit? Document YES/NO here: YES  4. Advise patient to be prepared - "Two hours prior to your appointment, go ahead and check your blood pressure, pulse, oxygen saturation, and your weight (if you have the equipment to check those) and write them all down. When your visit starts, your provider will ask you for this information. If you have an Apple Watch or Kardia device, please plan to have heart rate information ready on the day of your appointment. Please have a pen and paper handy nearby the day of the visit as well."  5. Give patient instructions for MyChart download to smartphone OR Doximity/Doxy.me as below if video visit (depending on what platform provider is using)  6. Inform patient they will receive a phone call 15 minutes prior to their appointment time (may be from unknown caller ID) so they should be prepared to answer    TELEPHONE CALL NOTE  Zachary Rowe has been deemed a candidate for a follow-up tele-health visit to limit community exposure during the Covid-19 pandemic. I spoke with the patient via phone to ensure availability of phone/video source, confirm preferred email & phone number, and discuss instructions and expectations.  I reminded Zachary Rowe to be prepared with any vital sign and/or heart rhythm information that could potentially be obtained via home monitoring, at the time of  his visit. I reminded Zachary Rowe to expect a phone call prior to his visit.  Zachary Rowe, CMA 06/03/2018 8:41 AM   INSTRUCTIONS FOR DOWNLOADING THE MYCHART APP TO SMARTPHONE  - The patient must first make sure to have activated MyChart and know their login information - If Apple, go to [employer] and type in MyChart in the search bar and download the app. If Android, ask patient to go to [employer] and type in [place] in the search bar and download the app. The app is free but as with any other app downloads, their phone may  require them to verify saved payment information or Apple/Android password.  - The patient will need to then log into the app with their MyChart username and password, and select [facility] as their healthcare provider to link the account. When it is time for your visit, go to the MyChart app, find appointments, and click Begin Video Visit. Be sure to Select Allow for your device to access the Microphone and Camera for your visit. You will then be connected, and your provider will be with you shortly.  **If they have any issues connecting, or need assistance please contact MyChart service desk [phone])**  **If using a computer, in order to ensure the best quality for their visit they will need to use either of the following Internet Browsers: [employer], or Google Chrome**  IF USING DOXIMITY or DOXY.ME - The patient will receive a link just prior to their visit by text.     FULL LENGTH CONSENT FOR TELE-HEALTH VISIT   I hereby voluntarily request, consent and authorize [facility] and its employed or contracted physicians, physician assistants, nurse practitioners or other licensed health care professionals (the Practitioner), to provide me with telemedicine health care services (the "Services") as deemed necessary by the treating Practitioner. I acknowledge and consent to receive the Services by the Practitioner via telemedicine. I understand that the telemedicine visit will involve communicating with the Practitioner through live audiovisual communication technology and the disclosure of certain medical information by electronic transmission. I acknowledge that I have been given the opportunity to request an in-person assessment or other available alternative prior to the telemedicine visit and am voluntarily participating in the telemedicine visit.  I understand that I have the right to withhold or withdraw my consent to the use of telemedicine in the course of my care at  any time, without affecting my right to future care or treatment, and that the Practitioner or I may terminate the telemedicine visit at any time. I understand that I have the right to inspect all information obtained and/or recorded in the course of the telemedicine visit and may receive copies of available information for a reasonable fee.  I understand that some of the potential risks of receiving the Services via telemedicine include:  Marland Kitchen Delay or interruption in medical evaluation due to technological equipment failure or disruption; . Information transmitted may not be sufficient (e.g. poor resolution of images) to allow for appropriate medical decision making by the Practitioner; and/or  . In rare instances, security protocols could fail, causing a breach of personal health information.  Furthermore, I acknowledge that it is my responsibility to provide information about my medical history, conditions and care that is complete and accurate to the best of my ability. I acknowledge that Practitioner's advice, recommendations, and/or decision may be based on factors not within their control, such as incomplete or inaccurate data provided by  me or distortions of diagnostic images or specimens that may result from electronic transmissions. I understand that the practice of medicine is not an exact science and that Practitioner makes no warranties or guarantees regarding treatment outcomes. I acknowledge that I will receive a copy of this consent concurrently upon execution via email to the email address I last provided but may also request a printed copy by calling the office of [place].    I understand that my insurance will be billed for this visit.   I have read or had this consent read to me. . I understand the contents of this consent, which adequately explains the benefits and risks of the Services being provided via telemedicine.  . I have been provided ample opportunity to ask questions  regarding this consent and the Services and have had my questions answered to my satisfaction. . I give my informed consent for the services to be provided through the use of telemedicine in my medical care  By participating in this telemedicine visit I agree to the above.

## 2018-06-03 NOTE — Telephone Encounter (Signed)
Contacted patient to discuss AVS instructions. Patient agreeable with Luke's recommendations and voiced understanding. AVS will be mailed to patient by medical records. 

## 2018-06-04 ENCOUNTER — Telehealth: Payer: BLUE CROSS/BLUE SHIELD | Admitting: Cardiology

## 2018-06-12 ENCOUNTER — Other Ambulatory Visit: Payer: Self-pay

## 2018-06-12 ENCOUNTER — Ambulatory Visit (AMBULATORY_SURGERY_CENTER): Payer: Self-pay | Admitting: *Deleted

## 2018-06-12 VITALS — Ht 75.0 in | Wt 218.0 lb

## 2018-06-12 DIAGNOSIS — Z86010 Personal history of colonic polyps: Secondary | ICD-10-CM

## 2018-06-12 NOTE — Progress Notes (Signed)
Patient's pre-visit was done today over the phone with the patient due to Covid-19 Name,DOB and address verified. Insurance verified. Packet of Prep instructions mailed to patient including copy of a consent form and pre-procedure patient acknowledgement form-pt is aware. Patient understands to call us back with any questions or concerns.   Patient denies any allergies to eggs or soy. Patient denies any problems with anesthesia/sedation. Patient denies any oxygen use at home. Patient denies taking any diet/weight loss medications or blood thinners. EMMI education assisgned to patient on colonoscopy, this was explained and instructions given to patient.

## 2018-06-15 ENCOUNTER — Encounter: Payer: Self-pay | Admitting: Internal Medicine

## 2018-06-25 ENCOUNTER — Telehealth: Payer: Self-pay | Admitting: Internal Medicine

## 2018-06-25 NOTE — Telephone Encounter (Signed)
LM on vmail to call back regarding covid-19 questions    Covid-19 Screening Questions: Do you now or have you had a fever in the last 14 days?  Do you have any respiratory symptoms of shortness of breath or cough now or in the last 14 days?  Do you have any family members or close contacts with diagnosed or suspected Covid-19 in the past 14 days?  Have you been tested for Covid-19 and found to be positive?

## 2018-06-25 NOTE — Telephone Encounter (Signed)
All answer are no

## 2018-06-26 ENCOUNTER — Ambulatory Visit (AMBULATORY_SURGERY_CENTER): Payer: BC Managed Care – PPO | Admitting: Internal Medicine

## 2018-06-26 ENCOUNTER — Other Ambulatory Visit: Payer: Self-pay

## 2018-06-26 ENCOUNTER — Encounter: Payer: Self-pay | Admitting: Internal Medicine

## 2018-06-26 VITALS — BP 138/80 | HR 56 | Temp 98.9°F | Resp 22 | Ht 75.0 in | Wt 228.0 lb

## 2018-06-26 DIAGNOSIS — D123 Benign neoplasm of transverse colon: Secondary | ICD-10-CM

## 2018-06-26 DIAGNOSIS — D125 Benign neoplasm of sigmoid colon: Secondary | ICD-10-CM | POA: Diagnosis not present

## 2018-06-26 DIAGNOSIS — D124 Benign neoplasm of descending colon: Secondary | ICD-10-CM

## 2018-06-26 DIAGNOSIS — Z1211 Encounter for screening for malignant neoplasm of colon: Secondary | ICD-10-CM | POA: Diagnosis not present

## 2018-06-26 DIAGNOSIS — D122 Benign neoplasm of ascending colon: Secondary | ICD-10-CM | POA: Diagnosis not present

## 2018-06-26 DIAGNOSIS — Z86010 Personal history of colonic polyps: Secondary | ICD-10-CM | POA: Diagnosis not present

## 2018-06-26 DIAGNOSIS — K635 Polyp of colon: Secondary | ICD-10-CM

## 2018-06-26 MED ORDER — SODIUM CHLORIDE 0.9 % IV SOLN: 500.0000 mL | Freq: Once | INTRAVENOUS | Status: DC

## 2018-06-26 NOTE — Progress Notes (Signed)
Pt's states no medical or surgical changes since previsit or office visit. 

## 2018-06-26 NOTE — Progress Notes (Signed)
Called to room to assist during endoscopic procedure.  Patient ID and intended procedure confirmed with present staff. Received instructions for my participation in the procedure from the performing physician.  

## 2018-06-26 NOTE — Patient Instructions (Addendum)
I removed 12 tiny polyps today.  I will let you know pathology results and when to have another routine colonoscopy by mail and/or My Chart.  I appreciate the opportunity to care for you. Gatha Mayer, MD, [facility]  Please read handouts provided. Continue present medications. Await pathology results.      YOU HAD AN ENDOSCOPIC PROCEDURE TODAY AT [facility]:   Refer to the procedure report that was given to you for any specific questions about what was found during the examination.  If the procedure report does not answer your questions, please call your gastroenterologist to clarify.  If you requested that your care partner not be given the details of your procedure findings, then the procedure report has been included in a sealed envelope for you to review at your convenience later.  YOU SHOULD EXPECT: Some feelings of bloating in the abdomen. Passage of more gas than usual.  Walking can help get rid of the air that was put into your GI tract during the procedure and reduce the bloating. If you had a lower endoscopy (such as a colonoscopy or flexible sigmoidoscopy) you may notice spotting of blood in your stool or on the toilet paper. If you underwent a bowel prep for your procedure, you may not have a normal bowel movement for a few days.  Please Note:  You might notice some irritation and congestion in your nose or some drainage.  This is from the oxygen used during your procedure.  There is no need for concern and it should clear up in a day or so.  SYMPTOMS TO REPORT IMMEDIATELY:   Following lower endoscopy (colonoscopy or flexible sigmoidoscopy):  Excessive amounts of blood in the stool  Significant tenderness or worsening of abdominal pains  Swelling of the abdomen that is new, acute  Fever of 100F or higher    For urgent or emergent issues, a gastroenterologist can be reached at any hour by calling [phone].   DIET:  We do recommend a  small meal at first, but then you may proceed to your regular diet.  Drink plenty of fluids but you should avoid alcoholic beverages for 24 hours.  ACTIVITY:  You should plan to take it easy for the rest of today and you should NOT DRIVE or use heavy machinery until tomorrow (because of the sedation medicines used during the test).    FOLLOW UP: Our staff will call the number listed on your records 48-72 hours following your procedure to check on you and address any questions or concerns that you may have regarding the information given to you following your procedure. If we do not reach you, we will leave a message.  We will attempt to reach you two times.  During this call, we will ask if you have developed any symptoms of COVID 19. If you develop any symptoms (ie: fever, flu-like symptoms, shortness of breath, cough etc.) before then, please call [phone].  If you test positive for Covid 19 in the 2 weeks post procedure, please call and report this information to [place].    If any biopsies were taken you will be contacted by phone or by letter within the next 1-3 weeks.  Please call us at [phone] if you have not heard about the biopsies in 3 weeks.    SIGNATURES/CONFIDENTIALITY: You and/or your care partner have signed paperwork which will be entered into your electronic medical record.  These signatures attest to the fact  that that the information above on your After Visit Summary has been reviewed and is understood.  Full responsibility of the confidentiality of this discharge information lies with you and/or your care-partner.

## 2018-06-26 NOTE — Op Note (Signed)
[place] Patient Name: Zachary Rowe Procedure Date: 06/26/2018 1:42 PM MRN: [id number] Endoscopist: Gatha Mayer , MD Age: 63 Referring MD:  Date of Birth: [birth year] Gender: Male Account #: [id number] Procedure:                Colonoscopy Indications:              Surveillance: Personal history of adenomatous                            polyps on last colonoscopy 5 years ago Medicines:                Propofol per Anesthesia, Monitored Anesthesia Care Procedure:                Pre-Anesthesia Assessment:                           - Prior to the procedure, a History and Physical                            was performed, and patient medications and                            allergies were reviewed. The patient's tolerance of                            previous anesthesia was also reviewed. The risks                            and benefits of the procedure and the sedation                            options and risks were discussed with the patient.                            All questions were answered, and informed consent                            was obtained. Prior Anticoagulants: The patient has                            taken no previous anticoagulant or antiplatelet                            agents. ASA Grade Assessment: II - A patient with                            mild systemic disease. After reviewing the risks                            and benefits, the patient was deemed in                            satisfactory condition to undergo the procedure.  After obtaining informed consent, the colonoscope                            was passed under direct vision. Throughout the                            procedure, the patient's blood pressure, pulse, and                            oxygen saturations were monitored continuously. The                            Colonoscope was introduced through the anus and   advanced to the the cecum, identified by                            appendiceal orifice and ileocecal valve. The                            colonoscopy was performed without difficulty. The                            patient tolerated the procedure well. The quality                            of the bowel preparation was good. The ileocecal                            valve, appendiceal orifice, and rectum were                            photographed. The bowel preparation used was                            Miralax via split dose instruction. Scope In: 1:51:54 PM Scope Out: 2:20:34 PM Scope Withdrawal Time: 0 hours 22 minutes 59 seconds  Total Procedure Duration: 0 hours 28 minutes 40 seconds  Findings:                 The perianal examination was normal.                           The digital rectal exam findings include enlarged                            prostate. Pertinent negatives include no palpable                            rectal lesions.                           12 sessile polyps were found in the sigmoid colon,                            descending colon, transverse colon and ascending  colon. The polyps were diminutive in size. These                            polyps were removed with a cold snare. Resection                            and retrieval were complete. Verification of                            patient identification for the specimen was done.                           The exam was otherwise without abnormality on                            direct and retroflexion views. Complications:            No immediate complications. Estimated Blood Loss:     Estimated blood loss was minimal. Impression:               - Enlarged prostate found on digital rectal exam.                           - 12 diminutive polyps in the sigmoid colon, in the                            descending colon, in the transverse colon and in                            the  ascending colon, removed with a cold snare.                            Resected and retrieved.                           - The examination was otherwise normal on direct                            and retroflexion views.                           - Personal history of colonic polyps. Recommendation:           - Patient has a contact number available for                            emergencies. The signs and symptoms of potential                            delayed complications were discussed with the                            patient. Return to normal activities tomorrow.  Written discharge instructions were provided to the                            patient.                           - Resume previous diet.                           - Continue present medications.                           - Repeat colonoscopy is recommended for                            surveillance. The colonoscopy date will be                            determined after pathology results from today's                            exam become available for review. Gatha Mayer, MD 06/26/2018 2:32:08 PM This report has been signed electronically.

## 2018-06-26 NOTE — Progress Notes (Signed)
A/ox3, pleased with MAC, report to RN 

## 2018-06-28 ENCOUNTER — Telehealth: Payer: Self-pay | Admitting: *Deleted

## 2018-06-28 NOTE — Telephone Encounter (Signed)
  Follow up Call-  Call back number 06/26/2018  Post procedure Call Back phone  # [phone]  Permission to leave phone message Yes  Some recent data might be hidden     Patient questions:  Do you have a fever, pain , or abdominal swelling? No. Pain Score  0 *  Have you tolerated food without any problems? Yes.    Have you been able to return to your normal activities? Yes.    Do you have any questions about your discharge instructions: Diet   No. Medications  No. Follow up visit  No.  Do you have questions or concerns about your Care? No.  Actions: * If pain score is 4 or above: No action needed, pain <4.  1. Have you developed a fever since your procedure? NO  2.   Have you had an respiratory symptoms (SOB or cough) since your procedure? NO  3.   Have you tested positive for COVID 19 since your procedure NO  4.   Have you had any family members/close contacts diagnosed with the COVID 19 since your procedure?  NO   If yes to any of these questions please route to Joylene John, RN and Alphonsa Gin, RN.

## 2018-07-01 ENCOUNTER — Ambulatory Visit (INDEPENDENT_AMBULATORY_CARE_PROVIDER_SITE_OTHER): Payer: BC Managed Care – PPO | Admitting: Family Medicine

## 2018-07-01 ENCOUNTER — Other Ambulatory Visit: Payer: Self-pay

## 2018-07-01 DIAGNOSIS — I10 Essential (primary) hypertension: Secondary | ICD-10-CM | POA: Diagnosis not present

## 2018-07-01 DIAGNOSIS — K635 Polyp of colon: Secondary | ICD-10-CM

## 2018-07-01 DIAGNOSIS — E7800 Pure hypercholesterolemia, unspecified: Secondary | ICD-10-CM | POA: Diagnosis not present

## 2018-07-01 DIAGNOSIS — N400 Enlarged prostate without lower urinary tract symptoms: Secondary | ICD-10-CM

## 2018-07-01 DIAGNOSIS — N183 Chronic kidney disease, stage 3 unspecified: Secondary | ICD-10-CM

## 2018-07-01 DIAGNOSIS — R739 Hyperglycemia, unspecified: Secondary | ICD-10-CM

## 2018-07-01 MED ORDER — SILDENAFIL CITRATE 20 MG PO TABS: 20.0000 mg | ORAL_TABLET | Freq: Every day | ORAL | 1 refills | Status: DC | PRN

## 2018-07-02 ENCOUNTER — Encounter: Payer: Self-pay | Admitting: Internal Medicine

## 2018-07-02 DIAGNOSIS — K635 Polyp of colon: Secondary | ICD-10-CM | POA: Insufficient documentation

## 2018-07-02 DIAGNOSIS — N400 Enlarged prostate without lower urinary tract symptoms: Secondary | ICD-10-CM | POA: Insufficient documentation

## 2018-07-02 NOTE — Progress Notes (Signed)
Virtual Visit via Video Note  I connected with Zachary Rowe on 07/01/18 at 10:40 AM EDT by a video enabled telemedicine application and verified that I am speaking with the correct person using two identifiers.  Location: Patient: home Provider: office   I discussed the limitations of evaluation and management by telemedicine and the availability of in person appointments. The patient expressed understanding and agreed to proceed. Zachary Rowe CMA was able to get the patient set up on video visit.    Subjective:    Patient ID: Zachary Rowe, male    DOB: Oct 26, 1955, 63 y.o.   MRN: [id number]  No chief complaint on file.   HPI Patient is in today for follow up on chronic medical concerns including renal insufficiency, hypertension, hyperlipidemia and more. No recent febrile illness or hospitalizations but he has had a inful episode of the shingles since his last visit. He jad a recent colonoscopy and they noted numerous polyps which were removed and were not cancerous or precancerous. He was also noted to have an enlarged prostate. Denies CP/palp/SOB/HA/congestion/fevers/GI c/o. Taking meds as prescribed  Past Medical History:  Diagnosis Date  . 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 Back pain 04/02/2014  . Bilateral carotid bruits 11/07/2016  . Bradycardia 08/06/2016  . Chicken pox as a child  . Chronic sinusitis 01-03-2000  . Constipation 08/06/2016  . Depression with anxiety 05/07/2016  . Elevated BP 09/02/2010  . Fatigue 11/01/2010  . Hard of hearing    wears bilateral hearing aids  . History of chicken pox 09/02/2010  . History of measles 09/02/2010  . HTN (hypertension) 09/02/2010  . Hyperglycemia 08/04/2016  . Hyperlipidemia   . Hypertension   . Measles as a child  . Multiple allergies 09/02/2010  . Overweight(278.02) 09/02/2010  . Personal history of colonic polyps - adenoma 04/17/2007   04/2007 - 3 sigmoid polyps - at least 1 tubular adenoma  . Peyronie disease 10/14/2012  .  Preventative health care 11/17/2012  . Raynaud disease 04/11/2011  . Thyroid disease     Past Surgical History:  Procedure Laterality Date  . COLONOSCOPY  03/06/2013   x2 repeated 2020  . SKIN BIOPSY     BB in forehead    Family History  Problem Relation Age of Onset  . Hypertension Mother   . Diabetes Mother        borderline  . Neuropathy Mother   . Macular degeneration Mother   . COPD Mother        recent pneumonia  . Cancer Mother        lung, refused chemo, tolerated radiation  . Lung cancer Mother   . Hypertension Father   . Diabetes Father        type 2  . Cancer Father 81       liver  . Liver cancer Father   . Heart disease Father   . Diabetes Maternal Grandmother   . Parkinson's disease Maternal Grandmother   . Emphysema Maternal Grandfather   . COPD Maternal Grandfather   . Stroke Paternal Grandfather   . Cancer Maternal Uncle        prostate cancer  . Colon cancer Neg Hx   . Esophageal cancer Neg Hx   . Rectal cancer Neg Hx   . Stomach cancer Neg Hx   . Colon polyps Neg Hx     Social History   Socioeconomic History  . Marital status: Married    Spouse name: Not on  file  . Number of children: Not on file  . Years of education: Not on file  . Highest education level: Not on file  Occupational History  . Not on file  Social Needs  . Financial resource strain: Not on file  . Food insecurity    Worry: Not on file    Inability: Not on file  . Transportation needs    Medical: Not on file    Non-medical: Not on file  Tobacco Use  . Smoking status: Former Smoker    Types: Cigarettes    Quit date: 01/03/2007    Years since quitting: 11.5  . Smokeless tobacco: Former Systems developer    Types: Chew  . Tobacco comment: dip  Substance and Sexual Activity  . Alcohol use: Yes    Alcohol/week: 15.0 - 20.0 standard drinks    Types: 15 - 20 Glasses of wine per week    Comment: occas drinks Scotch  . Drug use: No  . Sexual activity: Yes    Partners: Female     Comment: lives with wife  Lifestyle  . Physical activity    Days per week: Not on file    Minutes per session: Not on file  . Stress: Not on file  Relationships  . Social Herbalist on phone: Not on file    Gets together: Not on file    Attends religious service: Not on file    Active member of club or organization: Not on file    Attends meetings of clubs or organizations: Not on file    Relationship status: Not on file  . Intimate partner violence    Fear of current or ex partner: Not on file    Emotionally abused: Not on file    Physically abused: Not on file    Forced sexual activity: Not on file  Other Topics Concern  . Not on file  Social History Narrative  . Not on file    Outpatient Medications Prior to Visit  Medication Sig Dispense Refill  . amLODipine (NORVASC) 10 MG tablet Take 0.5 tablets (5 mg total) by mouth daily. 90 tablet 0  . atorvastatin (LIPITOR) 10 MG tablet TAKE 1 TABLET BY MOUTH  DAILY 90 tablet 1  . Azelastine HCl 0.15 % SOLN USE 2 SPRAY(S) ONCE DAILY AS NEEDED 30 mL 0  . Ferrous Sulfate (IRON PO) Take by mouth.    . levothyroxine (SYNTHROID) 88 MCG tablet TAKE 1 TABLET BY MOUTH ONCE DAILY BEFORE BREAKFAST 90 tablet 1  . lisinopril-hydrochlorothiazide (ZESTORETIC) 20-25 MG tablet TAKE 1 TABLET BY MOUTH  DAILY 90 tablet 1   No facility-administered medications prior to visit.     No Known Allergies  Review of Systems  Constitutional: Negative for fever and malaise/fatigue.  HENT: Negative for congestion.   Eyes: Negative for blurred vision.  Respiratory: Negative for shortness of breath.   Cardiovascular: Negative for chest pain, palpitations and leg swelling.  Gastrointestinal: Negative for abdominal pain, blood in stool and nausea.  Genitourinary: Positive for frequency. Negative for dysuria.  Musculoskeletal: Negative for falls.  Skin: Negative for rash.  Neurological: Positive for dizziness. Negative for loss of consciousness and  headaches.  Endo/Heme/Allergies: Negative for environmental allergies.  Psychiatric/Behavioral: Negative for depression. The patient is not nervous/anxious.        Objective:    Physical Exam Constitutional:      Appearance: Normal appearance. He is not ill-appearing.  HENT:     Head: Normocephalic and  atraumatic.     Nose: Nose normal.  Eyes:     General:        Right eye: No discharge.        Left eye: No discharge.  Pulmonary:     Effort: Pulmonary effort is normal.  Neurological:     Mental Status: He is alert and oriented to person, place, and time.  Psychiatric:        Mood and Affect: Mood normal.        Behavior: Behavior normal.     There were no vitals taken for this visit. Wt Readings from Last 3 Encounters:  06/26/18 228 lb (103.4 kg)  06/12/18 218 lb (98.9 kg)  06/03/18 219 lb (99.3 kg)    Diabetic Foot Exam - Simple   No data filed     Lab Results  Component Value Date   WBC 4.6 05/07/2018   HGB 14.5 05/07/2018   HCT 40.0 05/07/2018   PLT 268 05/07/2018   GLUCOSE 92 05/21/2018   CHOL 209 (H) 04/02/2018   TRIG 104.0 04/02/2018   HDL 73.40 04/02/2018   LDLDIRECT 134.4 09/08/2010   LDLCALC 115 (H) 04/02/2018   ALT 133 (H) 05/21/2018   AST 85 (H) 05/21/2018   NA 137 05/21/2018   K 3.3 (L) 05/21/2018   CL 98 05/21/2018   CREATININE 1.43 05/21/2018   BUN 19 05/21/2018   CO2 31 05/21/2018   TSH 3.09 04/02/2018   PSA 0.59 04/02/2018   HGBA1C 5.8 04/02/2018    Lab Results  Component Value Date   TSH 3.09 04/02/2018   Lab Results  Component Value Date   WBC 4.6 05/07/2018   HGB 14.5 05/07/2018   HCT 40.0 05/07/2018   MCV 96.4 05/07/2018   PLT 268 05/07/2018   Lab Results  Component Value Date   NA 137 05/21/2018   K 3.3 (L) 05/21/2018   CO2 31 05/21/2018   GLUCOSE 92 05/21/2018   BUN 19 05/21/2018   CREATININE 1.43 05/21/2018   BILITOT 0.5 05/21/2018   ALKPHOS 39 05/21/2018   AST 85 (H) 05/21/2018   ALT 133 (H) 05/21/2018    PROT 7.1 05/21/2018   ALBUMIN 4.4 05/21/2018   CALCIUM 9.3 05/21/2018   GFR 49.87 (L) 05/21/2018   Lab Results  Component Value Date   CHOL 209 (H) 04/02/2018   Lab Results  Component Value Date   HDL 73.40 04/02/2018   Lab Results  Component Value Date   LDLCALC 115 (H) 04/02/2018   Lab Results  Component Value Date   TRIG 104.0 04/02/2018   Lab Results  Component Value Date   CHOLHDL 3 04/02/2018   Lab Results  Component Value Date   HGBA1C 5.8 04/02/2018       Assessment & Plan:   Problem List Items Addressed This Visit    Essential hypertension (Chronic)    Encouraged to check weekly vital signs and report any concerning numbers no changes to meds. Encouraged heart healthy diet such as the DASH diet and exercise as tolerated.       Relevant Medications   sildenafil (REVATIO) 20 MG tablet   Hyperlipidemia    Encouraged heart healthy diet, increase exercise, avoid trans fats, consider a krill oil cap daily      Relevant Medications   sildenafil (REVATIO) 20 MG tablet   Hyperglycemia    hgba1c acceptable, minimize simple carbs. Increase exercise as tolerated.       CRI (chronic renal insufficiency), stage 3 (  moderate) ([place])    Hydrate and monitor, he is following with nephrology      BPH (benign prostatic hyperplasia)    With rising psa will repeat with next blood draw      Colon polyps    Numerous polyps removed during recent colonoscopy. No precancerous polyp so follow up in 5 years is recommended.          I am having [place] start on sildenafil. I am also having him maintain his Azelastine HCl, levothyroxine, lisinopril-hydrochlorothiazide, amLODipine, atorvastatin, and Ferrous Sulfate (IRON PO).  Meds ordered this encounter  Medications  . sildenafil (REVATIO) 20 MG tablet    Sig: Take 1-4 tablets (20-80 mg total) by mouth daily as needed.    Dispense:  30 tablet    Refill:  1     I discussed the assessment and treatment plan  with the patient. The patient was provided an opportunity to ask questions and all were answered. The patient agreed with the plan and demonstrated an understanding of the instructions.   The patient was advised to call back or seek an in-person evaluation if the symptoms worsen or if the condition fails to improve as anticipated.  I provided 25 minutes of non-face-to-face time during this encounter.   Penni Homans, MD

## 2018-07-02 NOTE — Assessment & Plan Note (Signed)
hgba1c acceptable, minimize simple carbs. Increase exercise as tolerated.  

## 2018-07-02 NOTE — Assessment & Plan Note (Signed)
Encouraged to check weekly vital signs and report any concerning numbers no changes to meds. Encouraged heart healthy diet such as the DASH diet and exercise as tolerated.

## 2018-07-02 NOTE — Assessment & Plan Note (Signed)
Numerous polyps removed during recent colonoscopy. No precancerous polyp so follow up in 5 years is recommended.

## 2018-07-02 NOTE — Assessment & Plan Note (Signed)
With rising psa will repeat with next blood draw

## 2018-07-02 NOTE — Assessment & Plan Note (Signed)
Hydrate and monitor, he is following with nephrology

## 2018-07-02 NOTE — Assessment & Plan Note (Signed)
Encouraged heart healthy diet, increase exercise, avoid trans fats, consider a krill oil cap daily 

## 2018-07-02 NOTE — Progress Notes (Signed)
12 adenomas Recall 2021 Also will refer to genetics given # of polyps Will call from office about that and also send letter

## 2018-07-03 ENCOUNTER — Telehealth: Payer: Self-pay

## 2018-07-03 ENCOUNTER — Other Ambulatory Visit: Payer: Self-pay

## 2018-07-03 DIAGNOSIS — Z86010 Personal history of colonic polyps: Secondary | ICD-10-CM

## 2018-07-03 DIAGNOSIS — D126 Benign neoplasm of colon, unspecified: Secondary | ICD-10-CM

## 2018-07-03 NOTE — Telephone Encounter (Signed)
Called patient left message for patient to call the office back     Copied from [facility] [phone]. Topic: General - Other >> Jul 03, 2018  9:29 AM Rainey Pines A wrote: Patient would like to speak with nurse in regards to colonoscopy results. Patient would like a callback

## 2018-07-04 ENCOUNTER — Encounter: Payer: Self-pay | Admitting: Licensed Clinical Social Worker

## 2018-07-04 ENCOUNTER — Telehealth: Payer: Self-pay | Admitting: Licensed Clinical Social Worker

## 2018-07-04 NOTE — Telephone Encounter (Signed)
A genetic counseling appt has been scheduled for the pt to see Faith Rogue on 7/23 at 9am. Letter mailed.

## 2018-07-24 ENCOUNTER — Telehealth: Payer: Self-pay | Admitting: Genetic Counselor

## 2018-07-24 ENCOUNTER — Telehealth: Payer: Self-pay | Admitting: Licensed Clinical Social Worker

## 2018-07-24 NOTE — Telephone Encounter (Signed)
Called patient regarding upcoming [facility] appointment, screen test complete and e-mail has been sent.

## 2018-07-25 ENCOUNTER — Encounter: Payer: Self-pay | Admitting: Licensed Clinical Social Worker

## 2018-07-25 ENCOUNTER — Inpatient Hospital Stay: Payer: BC Managed Care – PPO | Attending: Genetic Counselor | Admitting: Licensed Clinical Social Worker

## 2018-07-25 DIAGNOSIS — Z86010 Personal history of colonic polyps: Secondary | ICD-10-CM

## 2018-07-25 DIAGNOSIS — Z801 Family history of malignant neoplasm of trachea, bronchus and lung: Secondary | ICD-10-CM

## 2018-07-25 DIAGNOSIS — Z800 Family history of malignant neoplasm of digestive organs: Secondary | ICD-10-CM | POA: Insufficient documentation

## 2018-07-25 DIAGNOSIS — Z8042 Family history of malignant neoplasm of prostate: Secondary | ICD-10-CM | POA: Insufficient documentation

## 2018-07-25 NOTE — Progress Notes (Signed)
REFERRING PROVIDER: Gatha Mayer, MD [address],  [place] [address]  PRIMARY PROVIDER:  Mosie Lukes, MD  PRIMARY REASON FOR VISIT:  1. Personal history of colonic polyps - adenoma   2. Family history of lung cancer   3. Family history of prostate cancer   4. Family history of liver cancer    I connected with Zachary Rowe on 07/25/2018 at 9:00 AM EDT by Jackquline Denmark and verified that I am speaking with the correct person using two identifiers.    Patient location: home Provider location: clinic   HISTORY OF PRESENT ILLNESS:   Zachary Rowe, a 63 y.o. male, was seen for a [facility] cancer genetics consultation at the request of Dr. Carlean Purl due to a personal history of colon polyps.  Zachary Rowe presents to clinic today to discuss the possibility of a hereditary predisposition to cancer, genetic testing, and to further clarify his future cancer risks, as well as potential cancer risks for family members.    Zachary Rowe is a 63 y.o. male with no personal history of cancer.  In 2009, he had a colonoscopy that revealed 3 polyps, at least 1 adenoma. In 2015 he had a colonoscopy that revealed 4 polyps, a few were adenomas. In 2020 he had a colonoscopy that revealed 12 tubular adenomas, for a cumulative total of approximately 15 adenomas.   CANCER HISTORY:  Oncology History   No history exists.     Past Medical History:  Diagnosis Date  . 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 Back pain 04/02/2014  . Bilateral carotid bruits 11/07/2016  . Bradycardia 08/06/2016  . Chicken pox as a child  . Chronic sinusitis 01-03-2000  . Constipation 08/06/2016  . Depression with anxiety 05/07/2016  . Elevated BP 09/02/2010  . Family history of liver cancer   . Family history of lung cancer   . Family history of prostate cancer   . Fatigue 11/01/2010  . Hard of hearing    wears bilateral hearing aids  . History of chicken pox 09/02/2010  . History of measles 09/02/2010  . HTN (hypertension) 09/02/2010   . Hyperglycemia 08/04/2016  . Hyperlipidemia   . Hypertension   . Measles as a child  . Multiple allergies 09/02/2010  . Overweight(278.02) 09/02/2010  . Personal history of colonic polyps - adenoma 04/17/2007   04/2007 - 3 sigmoid polyps - at least 1 tubular adenoma  . Peyronie disease 10/14/2012  . Preventative health care 11/17/2012  . Raynaud disease 04/11/2011  . Thyroid disease     Past Surgical History:  Procedure Laterality Date  . COLONOSCOPY  03/06/2013   x2 repeated 2020  . SKIN BIOPSY     BB in forehead    Social History   Socioeconomic History  . Marital status: Married    Spouse name: Not on file  . Number of children: Not on file  . Years of education: Not on file  . Highest education level: Not on file  Occupational History  . Not on file  Social Needs  . Financial resource strain: Not on file  . Food insecurity    Worry: Not on file    Inability: Not on file  . Transportation needs    Medical: Not on file    Non-medical: Not on file  Tobacco Use  . Smoking status: Former Smoker    Types: Cigarettes    Quit date: 01/03/2007    Years since quitting: 11.5  . Smokeless tobacco: Former  User    Types: Chew  . Tobacco comment: dip  Substance and Sexual Activity  . Alcohol use: Yes    Alcohol/week: 15.0 - 20.0 standard drinks    Types: 15 - 20 Glasses of wine per week    Comment: occas drinks Scotch  . Drug use: No  . Sexual activity: Yes    Partners: Female    Comment: lives with wife  Lifestyle  . Physical activity    Days per week: Not on file    Minutes per session: Not on file  . Stress: Not on file  Relationships  . Social Herbalist on phone: Not on file    Gets together: Not on file    Attends religious service: Not on file    Active member of club or organization: Not on file    Attends meetings of clubs or organizations: Not on file    Relationship status: Not on file  Other Topics Concern  . Not on file  Social History  Narrative  . Not on file     FAMILY HISTORY:  We obtained a detailed, 4-generation family history.  Significant diagnoses are listed below: Family History  Problem Relation Age of Onset  . Hypertension Mother   . Diabetes Mother        borderline  . Neuropathy Mother   . Macular degeneration Mother   . COPD Mother        recent pneumonia  . Cancer Mother        lung, refused chemo, tolerated radiation  . Lung cancer Mother   . Hypertension Father   . Diabetes Father        type 2  . Cancer Father 82       liver  . Liver cancer Father   . Heart disease Father   . Diabetes Maternal Grandmother   . Parkinson's disease Maternal Grandmother   . Emphysema Maternal Grandfather   . COPD Maternal Grandfather   . Stroke Paternal Grandfather   . Cancer Maternal Uncle        prostate cancer  . Colon cancer Neg Hx   . Esophageal cancer Neg Hx   . Rectal cancer Neg Hx   . Stomach cancer Neg Hx   . Colon polyps Neg Hx    Zachary Rowe does not have children. He has one brother who is living at 24. He does not know if his brother has had a history of colon polyps.  Zachary Rowe mother died at 54 due to lung cancer. She had a history of smoking. Unsure if history of colon polyps.  Patient has several maternal aunts and uncles, he is unsure the exact number. One uncle had prostate cancer, unsure of age. No known cancers in maternal cousins. No known cancers in maternal grandparents.  Zachary Rowe father died at 102 and was diagnosed with liver cancer at 35. Unsure if he had history of colon polyps. The patient had 3 paternal uncles, 3 paternal aunts, no known cancers. No cancers in paternal cousins he is aware of. No cancers in paternal grandparents that he is aware of.   Zachary Rowe is unaware of previous family history of genetic testing for hereditary cancer risks. Patient's maternal ancestors are of [place] and [place] descent, and paternal ancestors are of [place] and [place] descent. There is  no reported Ashkenazi Jewish ancestry. There is no known consanguinity.  GENETIC COUNSELING ASSESSMENT: Zachary Rowe is a 63 y.o. male with a  personal history of colon polyps which is somewhat suggestive of a hereditary cancer syndrome/hereditary polyposis syndrome and predisposition to cancer. We, therefore, discussed and recommended the following at today's visit.   DISCUSSION: We discussed that polyps in general are common, however, most people have fewer than 5 lifetime polyps.  When an individual has 10 or more polyps we become concerned about an underlying polyposis syndrome.  The most common hereditary polyposis syndromes are caused by problems in the APC and MUTYH genes, however, more recently, mutations in the [place] and MSH3 genes have been identified in some polyposis families.  We also discussed Lynch syndrome.   We reviewed the characteristics, features and inheritance patterns of hereditary cancer syndromes. We also discussed genetic testing, including the appropriate family members to test, the process of testing, insurance coverage and turn-around-time for results. We discussed the implications of a negative, positive and/or variant of uncertain significant result. We recommended Zachary Rowe pursue genetic testing for the [facility] Common Hereditary Cancers gene panel.   The Common Hereditary Cancers Panel offered by [facility] includes sequencing and/or deletion duplication testing of the following 48 genes: APC, ATM, AXIN2, BARD1, BMPR1A, BRCA1, BRCA2, BRIP1, CDH1, CDKN2A (p14ARF), CDKN2A (p16INK4a), CKD4, CHEK2, CTNNA1, DICER1, EPCAM (Deletion/duplication testing only), GREM1 (promoter region deletion/duplication testing only), KIT, MEN1, MLH1, MSH2, MSH3, MSH6, MUTYH, NBN, NF1, NHTL1, PALB2, PDGFRA, PMS2, POLD1, POLE, PTEN, RAD50, RAD51C, RAD51D, RNF43, SDHB, SDHC, SDHD, SMAD4, SMARCA4. STK11, TP53, TSC1, TSC2, and VHL.  The following genes were evaluated for sequence changes only: SDHA and HOXB13  c.251G>A variant only.  Based on Zachary Rowe personal history of colon polyps he meets medical criteria for genetic testing. Despite that he meets criteria, he may still have an out of pocket cost.   PLAN: After considering the risks, benefits, and limitations, Zachary Rowe provided informed consent to pursue genetic testing. A saliva kit was mailed to his home and he will send his sample to to [facility] for analysis of the Common Hereditary Cancers Panel. Results should be available within approximately 2-3 weeks' time, at which point they will be disclosed by telephone to Zachary Rowe, as will any additional recommendations warranted by these results. Zachary Rowe will receive a summary of his genetic counseling visit and a copy of his results once available. This information will also be available in Epic.   Lastly, we encouraged Zachary Rowe to remain in contact with cancer genetics annually so that we can continuously update the family history and inform him of any changes in cancer genetics and testing that may be of benefit for this family.   Zachary Rowe questions were answered to his satisfaction today. Our contact information was provided should additional questions or concerns arise. Thank you for the referral and allowing [place] to share in the care of your patient.   Zachary Rogue, MS, [facility] Genetic Counselor [email or web address] Phone: [phone]  The patient was seen for a total of 35 minutes in face-to-face genetic counseling. A [employer] Intern, Virgina Evener, was present and assisted with this session.  Drs. Magrinat, Lindi Adie and/or Burr Medico were available for discussion regarding this case.   _______________________________________________________________________ For Office Staff:  Number of people involved in session: 2 Was an Intern/ student involved with case: yes

## 2018-08-06 DIAGNOSIS — Z800 Family history of malignant neoplasm of digestive organs: Secondary | ICD-10-CM | POA: Diagnosis not present

## 2018-08-06 DIAGNOSIS — Z801 Family history of malignant neoplasm of trachea, bronchus and lung: Secondary | ICD-10-CM | POA: Diagnosis not present

## 2018-08-06 DIAGNOSIS — Z8042 Family history of malignant neoplasm of prostate: Secondary | ICD-10-CM | POA: Diagnosis not present

## 2018-08-06 DIAGNOSIS — Z86010 Personal history of colonic polyps: Secondary | ICD-10-CM | POA: Diagnosis not present

## 2018-08-13 ENCOUNTER — Other Ambulatory Visit: Payer: Self-pay | Admitting: Family Medicine

## 2018-08-13 ENCOUNTER — Encounter: Payer: Self-pay | Admitting: Licensed Clinical Social Worker

## 2018-08-13 ENCOUNTER — Ambulatory Visit: Payer: Self-pay | Admitting: Licensed Clinical Social Worker

## 2018-08-13 ENCOUNTER — Telehealth: Payer: Self-pay | Admitting: Licensed Clinical Social Worker

## 2018-08-13 DIAGNOSIS — Z1379 Encounter for other screening for genetic and chromosomal anomalies: Secondary | ICD-10-CM

## 2018-08-13 DIAGNOSIS — Z8042 Family history of malignant neoplasm of prostate: Secondary | ICD-10-CM

## 2018-08-13 DIAGNOSIS — Z800 Family history of malignant neoplasm of digestive organs: Secondary | ICD-10-CM

## 2018-08-13 DIAGNOSIS — Z801 Family history of malignant neoplasm of trachea, bronchus and lung: Secondary | ICD-10-CM

## 2018-08-13 DIAGNOSIS — Z86010 Personal history of colonic polyps: Secondary | ICD-10-CM

## 2018-08-13 NOTE — Telephone Encounter (Signed)
Revealed negative genetic testing.   We discussed that we do not know why he has had colon polyps. It could be due to a different gene that we are not testing, or something our current technology cannot pick up.  It will be important for him to keep in contact with genetics to learn if additional testing may be needed in the future.  

## 2018-08-13 NOTE — Progress Notes (Signed)
HPI:  Mr. Speegle was previously seen in the [facility] clinic due to a personal history of colon polyps and concerns regarding a hereditary predisposition to cancer. Please refer to our prior cancer genetics clinic note for more information regarding our discussion, assessment and recommendations, at the time. Mr. Viviano recent genetic test results were disclosed to him, as were recommendations warranted by these results. These results and recommendations are discussed in more detail below.  Mr. Grillo is a 63 y.o. male with no personal history of cancer.  In 2009, he had a colonoscopy that revealed 3 polyps, at least 1 adenoma. In 2015 he had a colonoscopy that revealed 4 polyps, a few were adenomas. In 2020 he had a colonoscopy that revealed 12 tubular adenomas, for a cumulative total of approximately 15 adenomas.   CANCER HISTORY:  Oncology History   No history exists.    FAMILY HISTORY:  We obtained a detailed, 4-generation family history.  Significant diagnoses are listed below: Family History  Problem Relation Age of Onset   Hypertension Mother    Diabetes Mother        borderline   Neuropathy Mother    Macular degeneration Mother    COPD Mother        recent pneumonia   Cancer Mother        lung, refused chemo, tolerated radiation   Lung cancer Mother    Hypertension Father    Diabetes Father        type 2   Cancer Father 37       liver   Liver cancer Father    Heart disease Father    Diabetes Maternal Grandmother    Parkinson's disease Maternal Grandmother    Emphysema Maternal Grandfather    COPD Maternal Grandfather    Stroke Paternal Grandfather    Cancer Maternal Uncle        prostate cancer   Colon cancer Neg Hx    Esophageal cancer Neg Hx    Rectal cancer Neg Hx    Stomach cancer Neg Hx    Colon polyps Neg Hx     Mr. Pryce does not have children. He has one brother who is living at 56. He does not know if his brother has  had a history of colon polyps.  Mr. Stansbury mother died at 63 due to lung cancer. She had a history of smoking. Unsure if history of colon polyps.  Patient has several maternal aunts and uncles, he is unsure the exact number. One uncle had prostate cancer, unsure of age. No known cancers in maternal cousins. No known cancers in maternal grandparents.  Mr. Schifano father died at 44 and was diagnosed with liver cancer at 11. Unsure if he had history of colon polyps. The patient had 3 paternal uncles, 3 paternal aunts, no known cancers. No cancers in paternal cousins he is aware of. No cancers in paternal grandparents that he is aware of.   Mr. Iser is unaware of previous family history of genetic testing for hereditary cancer risks. Patient's maternal ancestors are of [place] and [place] descent, and paternal ancestors are of [place] and [place] descent. There is no reported Ashkenazi Jewish ancestry. There is no known consanguinity.  GENETIC TEST RESULTS: Genetic testing reported out on 08/12/2018 through the [facility] Common Hereditary cancer panel found no pathogenic mutations. The Common Hereditary Cancers Panel offered by [facility] includes sequencing and/or deletion duplication testing of the following 48 genes: APC, ATM, AXIN2, BARD1, BMPR1A,  BRCA1, BRCA2, BRIP1, CDH1, CDKN2A (p14ARF), CDKN2A (p16INK4a), CKD4, CHEK2, CTNNA1, DICER1, EPCAM (Deletion/duplication testing only), GREM1 (promoter region deletion/duplication testing only), KIT, MEN1, MLH1, MSH2, MSH3, MSH6, MUTYH, NBN, NF1, NHTL1, PALB2, PDGFRA, PMS2, POLD1, POLE, PTEN, RAD50, RAD51C, RAD51D, RNF43, SDHB, SDHC, SDHD, SMAD4, SMARCA4. STK11, TP53, TSC1, TSC2, and VHL.  The following genes were evaluated for sequence changes only: SDHA and HOXB13 c.251G>A variant only. The test report has been scanned into EPIC and is located under the Molecular Pathology section of the Results Review tab.  A portion of the result report is included below for  reference.    We discussed with Mr. Bart that because current genetic testing is not perfect, it is possible there may be a gene mutation in one of these genes that current testing cannot detect, but that chance is small.  We also discussed, that there could be another gene that has not yet been discovered, or that we have not yet tested, that is responsible for his polyps or cancer diagnoses in the family. It is also possible there is a hereditary cause for the cancer in the family that Mr. Luppino did not inherit and therefore was not identified in his testing.  Therefore, it is important to remain in touch with cancer genetics in the future so that we can continue to offer Mr. Stafford the most up to date genetic testing.   ADDITIONAL GENETIC TESTING: We discussed with Mr. Kothari that his genetic testing was fairly extensive.  If there are genes identified to increase cancer risk that can be analyzed in the future, we would be happy to discuss and coordinate this testing at that time.    CANCER SCREENING RECOMMENDATIONS: Mr. Mccants test result is considered negative (normal).  This means that we have not identified a hereditary cause for his  personal history of colon polyps and family history of cancer at this time.   While reassuring, this does not definitively rule out a hereditary predisposition to cancer. It is still possible that there could be genetic mutations that are undetectable by current technology. There could be genetic mutations in genes that have not been tested or identified to increase cancer risk.  Therefore, it is recommended he continue to follow the cancer management and screening guidelines provided by his oncology and primary healthcare provider.   An individual's cancer risk and medical management are not determined by genetic test results alone. Overall cancer risk assessment incorporates additional factors, including personal medical history, family history, and any available  genetic information that may result in a personalized plan for cancer prevention and surveillance.  This negative genetic test simply tells [place] that we cannot yet define why Mr. Applegate has had  an increased number of colorectal polyps.. Mr. [facility] medical management and screening should be based on the prospect that he will likely form more colon polyps in the future and should, therefore, undergo more frequent colonoscopy screening at intervals determined by his GI providers.   RECOMMENDATIONS FOR FAMILY MEMBERS:  Relatives in this family might be at some increased risk of developing cancer, over the general population risk, simply due to the family history of cancer.  We recommended male relatives in this family have a yearly mammogram beginning at age 42, or 74 years younger than the earliest onset of cancer, an annual clinical breast exam, and perform monthly breast self-exams. Male relatives in this family should also have a gynecological exam as recommended by their primary provider. All family members should have  a colonoscopy by age 30, or as directed by their physicians.  FOLLOW-UP: Lastly, we discussed with Mr. Vonbargen that cancer genetics is a rapidly advancing field and it is possible that new genetic tests will be appropriate for him and/or his family members in the future. We encouraged him to remain in contact with cancer genetics on an annual basis so we can update his personal and family histories and let him know of advances in cancer genetics that may benefit this family.   Our contact number was provided. Mr. Bibby questions were answered to his satisfaction, and he knows he is welcome to call us at anytime with additional questions or concerns.   Faith Rogue, MS, [facility] Counselor [email or web address] Phone: [phone]

## 2018-08-26 ENCOUNTER — Other Ambulatory Visit: Payer: Self-pay | Admitting: Family Medicine

## 2018-09-01 ENCOUNTER — Encounter: Payer: Self-pay | Admitting: Family Medicine

## 2018-09-26 ENCOUNTER — Encounter: Payer: Self-pay | Admitting: Family Medicine

## 2018-09-26 ENCOUNTER — Other Ambulatory Visit: Payer: Self-pay

## 2018-09-26 ENCOUNTER — Ambulatory Visit (INDEPENDENT_AMBULATORY_CARE_PROVIDER_SITE_OTHER): Payer: BC Managed Care – PPO | Admitting: Family Medicine

## 2018-09-26 VITALS — Temp 96.2°F | Resp 18 | Wt 218.6 lb

## 2018-09-26 DIAGNOSIS — Z0000 Encounter for general adult medical examination without abnormal findings: Secondary | ICD-10-CM

## 2018-09-26 DIAGNOSIS — N183 Chronic kidney disease, stage 3 unspecified: Secondary | ICD-10-CM

## 2018-09-26 DIAGNOSIS — I10 Essential (primary) hypertension: Secondary | ICD-10-CM

## 2018-09-26 DIAGNOSIS — R42 Dizziness and giddiness: Secondary | ICD-10-CM

## 2018-09-26 DIAGNOSIS — R739 Hyperglycemia, unspecified: Secondary | ICD-10-CM | POA: Diagnosis not present

## 2018-09-26 DIAGNOSIS — K635 Polyp of colon: Secondary | ICD-10-CM

## 2018-09-26 DIAGNOSIS — E7800 Pure hypercholesterolemia, unspecified: Secondary | ICD-10-CM | POA: Diagnosis not present

## 2018-09-26 DIAGNOSIS — E079 Disorder of thyroid, unspecified: Secondary | ICD-10-CM | POA: Diagnosis not present

## 2018-09-26 DIAGNOSIS — Z23 Encounter for immunization: Secondary | ICD-10-CM

## 2018-09-26 DIAGNOSIS — Z122 Encounter for screening for malignant neoplasm of respiratory organs: Secondary | ICD-10-CM

## 2018-09-26 DIAGNOSIS — N400 Enlarged prostate without lower urinary tract symptoms: Secondary | ICD-10-CM

## 2018-09-26 DIAGNOSIS — Z87891 Personal history of nicotine dependence: Secondary | ICD-10-CM

## 2018-09-26 LAB — LIPID PANEL
Cholesterol: 190 mg/dL (ref 0–200)
HDL: 66.5 mg/dL (ref >39.00)
NonHDL: 123.14
Total CHOL/HDL Ratio: 3
Triglycerides: 230 mg/dL — ABNORMAL HIGH (ref 0.0–149.0)
VLDL: 46 mg/dL — ABNORMAL HIGH (ref 0.0–40.0)

## 2018-09-26 LAB — CBC
HCT: 41.3 % (ref 39.0–52.0)
Hemoglobin: 14.3 g/dL (ref 13.0–17.0)
MCHC: 34.7 g/dL (ref 30.0–36.0)
MCV: 101.4 fl — ABNORMAL HIGH (ref 78.0–100.0)
Platelets: 267 10*3/uL (ref 150.0–400.0)
RBC: 4.08 Mil/uL — ABNORMAL LOW (ref 4.22–5.81)
RDW: 13 % (ref 11.5–15.5)
WBC: 6.1 10*3/uL (ref 4.0–10.5)

## 2018-09-26 LAB — COMPREHENSIVE METABOLIC PANEL
ALT: 81 U/L — ABNORMAL HIGH (ref 0–53)
AST: 55 U/L — ABNORMAL HIGH (ref 0–37)
Albumin: 4.6 g/dL (ref 3.5–5.2)
Alkaline Phosphatase: 40 U/L (ref 39–117)
BUN: 31 mg/dL — ABNORMAL HIGH (ref 6–23)
CO2: 25 mEq/L (ref 19–32)
Calcium: 10.3 mg/dL (ref 8.4–10.5)
Chloride: 95 mEq/L — ABNORMAL LOW (ref 96–112)
Creatinine, Ser: 1.7 mg/dL — ABNORMAL HIGH (ref 0.40–1.50)
GFR: 40.8 mL/min — ABNORMAL LOW (ref >60.00)
Glucose, Bld: 100 mg/dL — ABNORMAL HIGH (ref 70–99)
Potassium: 3.7 mEq/L (ref 3.5–5.1)
Sodium: 137 mEq/L (ref 135–145)
Total Bilirubin: 0.7 mg/dL (ref 0.2–1.2)
Total Protein: 7.3 g/dL (ref 6.0–8.3)

## 2018-09-26 LAB — HEMOGLOBIN A1C: Hgb A1c MFr Bld: 5.7 % (ref 4.6–6.5)

## 2018-09-26 LAB — LDL CHOLESTEROL, DIRECT: Direct LDL: 91 mg/dL

## 2018-09-26 LAB — TSH: TSH: 2.23 u[IU]/mL (ref 0.35–4.50)

## 2018-09-26 NOTE — Patient Instructions (Signed)
Multivitamin with minerals   Preventive Care 71-64 Years Old, Male Preventive care refers to lifestyle choices and visits with your health care provider that can promote health and wellness. This includes:  A yearly physical exam. This is also called an annual well check.  Regular dental and eye exams.  Immunizations.  Screening for certain conditions.  Healthy lifestyle choices, such as eating a healthy diet, getting regular exercise, not using drugs or products that contain nicotine and tobacco, and limiting alcohol use. What can I expect for my preventive care visit? Physical exam Your health care provider will check:  Height and weight. These may be used to calculate body mass index (BMI), which is a measurement that tells if you are at a healthy weight.  Heart rate and blood pressure.  Your skin for abnormal spots. Counseling Your health care provider may ask you questions about:  Alcohol, tobacco, and drug use.  Emotional well-being.  Home and relationship well-being.  Sexual activity.  Eating habits.  Work and work Statistician. What immunizations do I need?  Influenza (flu) vaccine  This is recommended every year. Tetanus, diphtheria, and pertussis (Tdap) vaccine  You may need a Td booster every 10 years. Varicella (chickenpox) vaccine  You may need this vaccine if you have not already been vaccinated. Zoster (shingles) vaccine  You may need this after age 51. Measles, mumps, and rubella (MMR) vaccine  You may need at least one dose of MMR if you were born in 1957 or later. You may also need a second dose. Pneumococcal conjugate (PCV13) vaccine  You may need this if you have certain conditions and were not previously vaccinated. Pneumococcal polysaccharide (PPSV23) vaccine  You may need one or two doses if you smoke cigarettes or if you have certain conditions. Meningococcal conjugate (MenACWY) vaccine  You may need this if you have certain  conditions. Hepatitis A vaccine  You may need this if you have certain conditions or if you travel or work in places where you may be exposed to hepatitis A. Hepatitis B vaccine  You may need this if you have certain conditions or if you travel or work in places where you may be exposed to hepatitis B. Haemophilus influenzae type b (Hib) vaccine  You may need this if you have certain risk factors. Human papillomavirus (HPV) vaccine  If recommended by your health care provider, you may need three doses over 6 months. You may receive vaccines as individual doses or as more than one vaccine together in one shot (combination vaccines). Talk with your health care provider about the risks and benefits of combination vaccines. What tests do I need? Blood tests  Lipid and cholesterol levels. These may be checked every 5 years, or more frequently if you are over 40 years old.  Hepatitis C test.  Hepatitis B test. Screening  Lung cancer screening. You may have this screening every year starting at age 9 if you have a 30-pack-year history of smoking and currently smoke or have quit within the past 15 years.  Prostate cancer screening. Recommendations will vary depending on your family history and other risks.  Colorectal cancer screening. All adults should have this screening starting at age 45 and continuing until age 7. Your health care provider may recommend screening at age 50 if you are at increased risk. You will have tests every 1-10 years, depending on your results and the type of screening test.  Diabetes screening. This is done by checking your blood sugar (glucose)  after you have not eaten for a while (fasting). You may have this done every 1-3 years.  Sexually transmitted disease (STD) testing. Follow these instructions at home: Eating and drinking  Eat a diet that includes fresh fruits and vegetables, whole grains, lean protein, and low-fat dairy products.  Take vitamin and  mineral supplements as recommended by your health care provider.  Do not drink alcohol if your health care provider tells you not to drink.  If you drink alcohol: ? Limit how much you have to 0-2 drinks a day. ? Be aware of how much alcohol is in your drink. In the [place] one drink equals one 12 oz bottle of beer (355 mL), one 5 oz glass of wine (148 mL), or one 1 oz glass of hard liquor (44 mL). Lifestyle  Take daily care of your teeth and gums.  Stay active. Exercise for at least 30 minutes on 5 or more days each week.  Do not use any products that contain nicotine or tobacco, such as cigarettes, e-cigarettes, and chewing tobacco. If you need help quitting, ask your health care provider.  If you are sexually active, practice safe sex. Use a condom or other form of protection to prevent STIs (sexually transmitted infections).  Talk with your health care provider about taking a low-dose aspirin every day starting at age 65. What's next?  Go to your health care provider once a year for a well check visit.  Ask your health care provider how often you should have your eyes and teeth checked.  Stay up to date on all vaccines. This information is not intended to replace advice given to you by your health care provider. Make sure you discuss any questions you have with your health care provider. Document Released: 01/15/2015 Document Revised: 12/13/2017 Document Reviewed: 12/13/2017 Elsevier Patient Education  2020 [employer].

## 2018-09-27 ENCOUNTER — Encounter: Payer: Self-pay | Admitting: Family Medicine

## 2018-09-27 ENCOUNTER — Other Ambulatory Visit: Payer: Self-pay | Admitting: *Deleted

## 2018-09-27 DIAGNOSIS — R748 Abnormal levels of other serum enzymes: Secondary | ICD-10-CM

## 2018-09-29 DIAGNOSIS — Z87891 Personal history of nicotine dependence: Secondary | ICD-10-CM | POA: Insufficient documentation

## 2018-09-29 NOTE — Assessment & Plan Note (Signed)
Colonoscopy iin 2020 showed many polyps they will recall 2021

## 2018-09-29 NOTE — Assessment & Plan Note (Signed)
Hydrate and monitor. He notes he really only drinks coffee in am and often does not eat and then goes outside to work resulting in him feeling light headed and woozey. He is encouraged to swap out some coffee for water and to add some protein in the am.

## 2018-09-29 NOTE — Assessment & Plan Note (Addendum)
He notes he really only drinks coffee in am and often does not eat and then goes outside to work resulting in him feeling light headed and woozey. He is encouraged to swap out some coffee for water and to add some protein in the am. He will report if symptoms do not improve.

## 2018-09-29 NOTE — Progress Notes (Signed)
Subjective:    Patient ID: Zachary Rowe, male    DOB: [birth year], 63 y.o.   MRN: [id number]  No chief complaint on file.   HPI Patient is in today for annual preventative exam and follow up on chronic medical concerns including hypertension, hyperglycemia, and hyperlipdiemia. No  Recent febrile illness or hospitalizations. He is maintaining quarantine when he can. He is trying to maintain a heart healthy and stay active. He is noting increased episodes of feeling dizzy.  He notes he really only drinks coffee in am and often does not eat and then goes outside to work resulting in him feeling light headed and woozey. Denies CP/palp/SOB/HA/congestion/fevers/GI or GU c/o. Taking meds as prescribed  Past Medical History:  Diagnosis Date  . 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 Back pain 04/02/2014  . Bilateral carotid bruits 11/07/2016  . Bradycardia 08/06/2016  . Chicken pox as a child  . Chronic sinusitis 01-03-2000  . Constipation 08/06/2016  . Depression with anxiety 05/07/2016  . Elevated BP 09/02/2010  . Family history of liver cancer   . Family history of lung cancer   . Family history of prostate cancer   . Fatigue 11/01/2010  . Hard of hearing    wears bilateral hearing aids  . History of chicken pox 09/02/2010  . History of measles 09/02/2010  . HTN (hypertension) 09/02/2010  . Hyperglycemia 08/04/2016  . Hyperlipidemia   . Hypertension   . Measles as a child  . Multiple allergies 09/02/2010  . Overweight(278.02) 09/02/2010  . Personal history of colonic polyps - adenoma 04/17/2007   04/2007 - 3 sigmoid polyps - at least 1 tubular adenoma  . Peyronie disease 10/14/2012  . Preventative health care 11/17/2012  . Raynaud disease 04/11/2011  . Thyroid disease     Past Surgical History:  Procedure Laterality Date  . COLONOSCOPY  03/06/2013   x2 repeated 2020  . SKIN BIOPSY     BB in forehead    Family History  Problem Relation Age of Onset  . Hypertension Mother   . Diabetes  Mother        borderline  . Neuropathy Mother   . Macular degeneration Mother   . COPD Mother        recent pneumonia  . Cancer Mother        lung, refused chemo, tolerated radiation  . Lung cancer Mother   . Hypertension Father   . Diabetes Father        type 2  . Cancer Father 32       liver  . Liver cancer Father   . Heart disease Father   . Diabetes Maternal Grandmother   . Parkinson's disease Maternal Grandmother   . Emphysema Maternal Grandfather   . COPD Maternal Grandfather   . Stroke Paternal Grandfather   . Cancer Maternal Uncle        prostate cancer  . Colon cancer Neg Hx   . Esophageal cancer Neg Hx   . Rectal cancer Neg Hx   . Stomach cancer Neg Hx   . Colon polyps Neg Hx     Social History   Socioeconomic History  . Marital status: Married    Spouse name: Not on file  . Number of children: Not on file  . Years of education: Not on file  . Highest education level: Not on file  Occupational History  . Not on file  Social Needs  . Financial resource strain: Not on file  .  Food insecurity    Worry: Not on file    Inability: Not on file  . Transportation needs    Medical: Not on file    Non-medical: Not on file  Tobacco Use  . Smoking status: Former Smoker    Types: Cigarettes    Quit date: 01/03/2007    Years since quitting: 11.7  . Smokeless tobacco: Former Systems developer    Types: Chew  . Tobacco comment: dip  Substance and Sexual Activity  . Alcohol use: Yes    Alcohol/week: 15.0 - 20.0 standard drinks    Types: 15 - 20 Glasses of wine per week    Comment: occas drinks Scotch  . Drug use: No  . Sexual activity: Yes    Partners: Female    Comment: lives with wife  Lifestyle  . Physical activity    Days per week: Not on file    Minutes per session: Not on file  . Stress: Not on file  Relationships  . Social Herbalist on phone: Not on file    Gets together: Not on file    Attends religious service: Not on file    Active member of  club or organization: Not on file    Attends meetings of clubs or organizations: Not on file    Relationship status: Not on file  . Intimate partner violence    Fear of current or ex partner: Not on file    Emotionally abused: Not on file    Physically abused: Not on file    Forced sexual activity: Not on file  Other Topics Concern  . Not on file  Social History Narrative  . Not on file    Outpatient Medications Prior to Visit  Medication Sig Dispense Refill  . amLODipine (NORVASC) 10 MG tablet TAKE 1 TABLET BY MOUTH  DAILY 90 tablet 3  . atorvastatin (LIPITOR) 10 MG tablet TAKE 1 TABLET BY MOUTH  DAILY 90 tablet 1  . Azelastine HCl 0.15 % SOLN USE 2 SPRAY(S) ONCE DAILY AS NEEDED 30 mL 0  . levothyroxine (SYNTHROID) 88 MCG tablet TAKE 1 TABLET BY MOUTH ONCE DAILY BEFORE BREAKFAST 90 tablet 1  . lisinopril-hydrochlorothiazide (ZESTORETIC) 20-25 MG tablet TAKE 1 TABLET BY MOUTH  DAILY 90 tablet 1  . Ferrous Sulfate (IRON PO) Take by mouth.    . sildenafil (REVATIO) 20 MG tablet Take 1-4 tablets (20-80 mg total) by mouth daily as needed. 30 tablet 1   No facility-administered medications prior to visit.     No Known Allergies  Review of Systems  Constitutional: Negative for chills, fever and malaise/fatigue.  HENT: Negative for congestion and hearing loss.   Eyes: Negative for discharge.  Respiratory: Negative for cough, sputum production and shortness of breath.   Cardiovascular: Negative for chest pain, palpitations and leg swelling.  Gastrointestinal: Negative for abdominal pain, blood in stool, constipation, diarrhea, heartburn, nausea and vomiting.  Genitourinary: Negative for dysuria, frequency, hematuria and urgency.  Musculoskeletal: Negative for back pain, falls and myalgias.  Skin: Negative for rash.  Neurological: Positive for dizziness. Negative for sensory change, loss of consciousness, weakness and headaches.  Endo/Heme/Allergies: Negative for environmental  allergies. Does not bruise/bleed easily.  Psychiatric/Behavioral: Negative for depression and suicidal ideas. The patient is not nervous/anxious and does not have insomnia.        Objective:    Physical Exam Vitals signs and nursing note reviewed.  Constitutional:      General: He is not in  acute distress.    Appearance: He is well-developed.  HENT:     Head: Normocephalic and atraumatic.     Right Ear: Tympanic membrane, ear canal and external ear normal. There is no impacted cerumen.     Left Ear: Tympanic membrane, ear canal and external ear normal. There is no impacted cerumen.     Nose: Nose normal.  Eyes:     General:        Right eye: No discharge.        Left eye: No discharge.     Extraocular Movements: Extraocular movements intact.     Pupils: Pupils are equal, round, and reactive to light.  Neck:     Musculoskeletal: Normal range of motion and neck supple.  Cardiovascular:     Rate and Rhythm: Normal rate and regular rhythm.     Pulses: Normal pulses.     Heart sounds: Normal heart sounds. No murmur.  Pulmonary:     Effort: Pulmonary effort is normal.     Breath sounds: Normal breath sounds. No wheezing.  Abdominal:     General: Bowel sounds are normal.     Palpations: Abdomen is soft. There is no mass.     Tenderness: There is no abdominal tenderness. There is no guarding.  Musculoskeletal: Normal range of motion.        General: No tenderness.  Skin:    General: Skin is warm and dry.  Neurological:     Mental Status: He is alert and oriented to person, place, and time.     Temp (!) 96.2 F (35.7 C) (Temporal)   Resp 18   Wt 218 lb 9.6 oz (99.2 kg)   SpO2 97%   BMI 27.32 kg/m  Wt Readings from Last 3 Encounters:  09/26/18 218 lb 9.6 oz (99.2 kg)  06/26/18 228 lb (103.4 kg)  06/12/18 218 lb (98.9 kg)    Diabetic Foot Exam - Simple   No data filed     Lab Results  Component Value Date   WBC 6.1 09/26/2018   HGB 14.3 09/26/2018   HCT 41.3  09/26/2018   PLT 267.0 09/26/2018   GLUCOSE 100 (H) 09/26/2018   CHOL 190 09/26/2018   TRIG 230.0 (H) 09/26/2018   HDL 66.50 09/26/2018   LDLDIRECT 91.0 09/26/2018   LDLCALC 115 (H) 04/02/2018   ALT 81 (H) 09/26/2018   AST 55 (H) 09/26/2018   NA 137 09/26/2018   K 3.7 09/26/2018   CL 95 (L) 09/26/2018   CREATININE 1.70 (H) 09/26/2018   BUN 31 (H) 09/26/2018   CO2 25 09/26/2018   TSH 2.23 09/26/2018   PSA 0.59 04/02/2018   HGBA1C 5.7 09/26/2018    Lab Results  Component Value Date   TSH 2.23 09/26/2018   Lab Results  Component Value Date   WBC 6.1 09/26/2018   HGB 14.3 09/26/2018   HCT 41.3 09/26/2018   MCV 101.4 (H) 09/26/2018   PLT 267.0 09/26/2018   Lab Results  Component Value Date   NA 137 09/26/2018   K 3.7 09/26/2018   CO2 25 09/26/2018   GLUCOSE 100 (H) 09/26/2018   BUN 31 (H) 09/26/2018   CREATININE 1.70 (H) 09/26/2018   BILITOT 0.7 09/26/2018   ALKPHOS 40 09/26/2018   AST 55 (H) 09/26/2018   ALT 81 (H) 09/26/2018   PROT 7.3 09/26/2018   ALBUMIN 4.6 09/26/2018   CALCIUM 10.3 09/26/2018   GFR 40.80 (L) 09/26/2018   Lab Results  Component  Value Date   CHOL 190 09/26/2018   Lab Results  Component Value Date   HDL 66.50 09/26/2018   Lab Results  Component Value Date   LDLCALC 115 (H) 04/02/2018   Lab Results  Component Value Date   TRIG 230.0 (H) 09/26/2018   Lab Results  Component Value Date   CHOLHDL 3 09/26/2018   Lab Results  Component Value Date   HGBA1C 5.7 09/26/2018       Assessment & Plan:   Problem List Items Addressed This Visit    Essential hypertension (Chronic)    Blood pressures running low recently will try holding Amlodipine and he will monitor BP, pulse and symptoms.      Relevant Orders   CBC (Completed)   Comprehensive metabolic panel (Completed)   TSH (Completed)   Thyroid disease   Hyperlipidemia   Relevant Orders   Lipid panel (Completed)   Preventative health care    Patient encouraged to maintain  heart healthy diet, regular exercise, adequate sleep. Consider daily probiotics. Take medications as prescribed. Labs ordered and reviewed.       Hyperglycemia   Relevant Orders   Hemoglobin A1c (Completed)   Dizziness    Hydrate and monitor. He notes he really only drinks coffee in am and often does not eat and then goes outside to work resulting in him feeling light headed and woozey. He is encouraged to swap out some coffee for water and to add some protein in the am. He will report if symptoms do not improve.       CRI (chronic renal insufficiency), stage 3 (moderate) (HCC)    Hydrate and monitor. He notes he really only drinks coffee in am and often does not eat and then goes outside to work resulting in him feeling light headed and woozey. He is encouraged to swap out some coffee for water and to add some protein in the am.       BPH (benign prostatic hyperplasia)   Colon polyps    Colonoscopy iin 2020 showed many polyps they will recall 2021      H/O tobacco use, presenting hazards to health    He agrees to annual surveillance via low dose CT program if he is eligible. He began smoking at age 26 and quit at age 63. He reports he smoked a ppd most of those years.        Other Visit Diagnoses    Needs flu shot    -  Primary   Relevant Orders   Flu Vaccine QUAD 6+ mos PF IM (Fluarix Quad PF) (Completed)   Encounter for screening for malignant neoplasm of respiratory organs       Relevant Orders   CT CHEST LUNG CA SCREEN LOW DOSE W/O CM      I have discontinued Titus L. Ensz's Ferrous Sulfate (IRON PO) and sildenafil. I am also having him maintain his levothyroxine, lisinopril-hydrochlorothiazide, atorvastatin, amLODipine, and Azelastine HCl.  No orders of the defined types were placed in this encounter.    Penni Homans, MD

## 2018-09-29 NOTE — Assessment & Plan Note (Signed)
Blood pressures running low recently will try holding Amlodipine and he will monitor BP, pulse and symptoms.

## 2018-09-29 NOTE — Assessment & Plan Note (Addendum)
He agrees to annual surveillance via low dose CT program if he is eligible. He began smoking at age 63 and quit at age 77. He reports he smoked a ppd most of those years.

## 2018-09-29 NOTE — Assessment & Plan Note (Signed)
Patient encouraged to maintain heart healthy diet, regular exercise, adequate sleep. Consider daily probiotics. Take medications as prescribed. Labs ordered and reviewed 

## 2018-10-07 ENCOUNTER — Ambulatory Visit (HOSPITAL_BASED_OUTPATIENT_CLINIC_OR_DEPARTMENT_OTHER)
Admission: RE | Admit: 2018-10-07 | Discharge: 2018-10-07 | Disposition: A | Discharge status: Home / Self Care | Payer: BC Managed Care – PPO | Source: Ambulatory Visit | Attending: Family Medicine | Admitting: Family Medicine

## 2018-10-07 ENCOUNTER — Other Ambulatory Visit: Payer: Self-pay

## 2018-10-07 DIAGNOSIS — Z87891 Personal history of nicotine dependence: Secondary | ICD-10-CM | POA: Diagnosis not present

## 2018-10-07 DIAGNOSIS — Z122 Encounter for screening for malignant neoplasm of respiratory organs: Secondary | ICD-10-CM | POA: Insufficient documentation

## 2018-10-08 ENCOUNTER — Encounter: Payer: Self-pay | Admitting: Family Medicine

## 2018-10-08 ENCOUNTER — Other Ambulatory Visit: Payer: Self-pay | Admitting: Family Medicine

## 2018-10-08 DIAGNOSIS — I7090 Unspecified atherosclerosis: Secondary | ICD-10-CM

## 2018-10-08 DIAGNOSIS — E7800 Pure hypercholesterolemia, unspecified: Secondary | ICD-10-CM

## 2018-10-08 DIAGNOSIS — I10 Essential (primary) hypertension: Secondary | ICD-10-CM

## 2018-10-10 ENCOUNTER — Other Ambulatory Visit: Payer: Self-pay

## 2018-10-10 ENCOUNTER — Ambulatory Visit: Payer: BC Managed Care – PPO | Admitting: Cardiology

## 2018-10-10 ENCOUNTER — Encounter: Payer: Self-pay | Admitting: Cardiology

## 2018-10-10 VITALS — BP 113/74 | HR 69 | Temp 97.5°F | Ht 75.0 in | Wt 215.0 lb

## 2018-10-10 DIAGNOSIS — I2510 Atherosclerotic heart disease of native coronary artery without angina pectoris: Secondary | ICD-10-CM

## 2018-10-10 DIAGNOSIS — Z712 Person consulting for explanation of examination or test findings: Secondary | ICD-10-CM

## 2018-10-10 DIAGNOSIS — I10 Essential (primary) hypertension: Secondary | ICD-10-CM

## 2018-10-10 DIAGNOSIS — Z7189 Other specified counseling: Secondary | ICD-10-CM

## 2018-10-10 DIAGNOSIS — E7800 Pure hypercholesterolemia, unspecified: Secondary | ICD-10-CM

## 2018-10-10 NOTE — Patient Instructions (Signed)

## 2018-10-10 NOTE — Progress Notes (Signed)
Cardiology Office Note:    Date:  10/10/2018   ID:  Zachary Rowe, DOB [birth year], MRN [id number]  PCP:  Mosie Lukes, MD  Cardiologist:  Skeet Latch, MD  Referring MD: Mosie Lukes, MD   CC: follow up  History of Present Illness:    Zachary Rowe is a 63 y.o. male with a hx of hypertension, hyperlipidemia who is seen in follow up for management of hypertension, hyperlipidemia, and aortic atherosclerosis. He follows with Dr. Oval Linsey and was last seen by Zachary Rowe on 06/03/18. He is seen by me today due to scheduling availability.  Patient concerns today: Had CT chest, spoke to Dr. Charlett Blake about results, asked if he should see a cardiologist to discuss. We reviewed his images together. Some calcium in aortic arch, mild calcium left system.  Cardiovascular risk factors: Prior clinical ASCVD: none Comorbid conditions: hypertension, hyperlipidemia. No diabetes, chronic kidney disease that he knows of (though is being monitored)  Metabolic syndrome/Obesity: BMI 26 Chronic inflammatory conditions: none Tobacco use history: former, quit 13 years ago. Was 1 ppd for ~50 years, started when he was 63 years old. Family history: mother side with excellent longevity. Had one grandfather with possible stroke Prior cardiac testing and/or incidental findings on other testing: ETT in 2018 with Dr. Oval Linsey, echo 2012, calcium on CT.  Exercise level: active with yardwork, hunting. Limited by back pain, not chest pain or breathing. Walks on treadmill for a mile at least once a day.  Current diet: low carb diet, trying to get into keto short term but plan to get back to DASH long term.   Denies chest pain, shortness of breath at rest or with normal exertion. No PND, orthopnea, LE edema or unexpected weight gain. No syncope or palpitations.  Notices "heartburn" after activity, lightheadedness. Warmth in center of chest. Does happen at rest but mostly with exertion. Nonradiating, lasts 1-2  minutes. Associated with fatigue, back pain. No SOB, nausea, diaphoresis with it. Better with antacids.   BP had been labile, but recently low. Off amlodipine now, BP 125-150/70s. Takes home BP, almost always under [id number] systolic.   Cholesterol: has been on atorvastatin about 10 years. Was not fasting, TG 230. LDL 91.   Past Medical History:  Diagnosis Date  . 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 Back pain 04/02/2014  . Bilateral carotid bruits 11/07/2016  . Bradycardia 08/06/2016  . Chicken pox as a child  . Chronic sinusitis 01-03-2000  . Constipation 08/06/2016  . Depression with anxiety 05/07/2016  . Elevated BP 09/02/2010  . Family history of liver cancer   . Family history of lung cancer   . Family history of prostate cancer   . Fatigue 11/01/2010  . Hard of hearing    wears bilateral hearing aids  . History of chicken pox 09/02/2010  . History of measles 09/02/2010  . HTN (hypertension) 09/02/2010  . Hyperglycemia 08/04/2016  . Hyperlipidemia   . Hypertension   . Measles as a child  . Multiple allergies 09/02/2010  . Overweight(278.02) 09/02/2010  . Personal history of colonic polyps - adenoma 04/17/2007   04/2007 - 3 sigmoid polyps - at least 1 tubular adenoma  . Peyronie disease 10/14/2012  . Preventative health care 11/17/2012  . Raynaud disease 04/11/2011  . Thyroid disease     Past Surgical History:  Procedure Laterality Date  . COLONOSCOPY  03/06/2013   x2 repeated 2020  . SKIN BIOPSY     BB in forehead  Current Medications: Current Outpatient Medications on File Prior to Visit  Medication Sig  . atorvastatin (LIPITOR) 10 MG tablet TAKE 1 TABLET BY MOUTH  DAILY  . Azelastine HCl 0.15 % SOLN USE 2 SPRAY(S) ONCE DAILY AS NEEDED  . folic acid (FOLVITE) [id number] MCG tablet Take 400 mcg by mouth daily.  Marland Kitchen levothyroxine (SYNTHROID) 88 MCG tablet TAKE 1 TABLET BY MOUTH ONCE DAILY BEFORE BREAKFAST  . lisinopril-hydrochlorothiazide (ZESTORETIC) 20-25 MG tablet TAKE 1 TABLET BY  MOUTH  DAILY  . thiamine 250 MG tablet Take 250 mg by mouth daily.  Marland Kitchen amLODipine (NORVASC) 10 MG tablet TAKE 1 TABLET BY MOUTH  DAILY (Patient not taking: Reported on 10/10/2018)   No current facility-administered medications on file prior to visit.      Allergies:   Patient has no known allergies.   Social History   Tobacco Use  . Smoking status: Former Smoker    Types: Cigarettes    Quit date: 01/03/2007    Years since quitting: 11.7  . Smokeless tobacco: Former Systems developer    Types: Chew  . Tobacco comment: dip  Substance Use Topics  . Alcohol use: Yes    Alcohol/week: 15.0 - 20.0 standard drinks    Types: 15 - 20 Glasses of wine per week    Comment: occas drinks Scotch  . Drug use: No    Family History: family history includes COPD in his maternal grandfather and mother; Cancer in his maternal uncle and mother; Cancer (age of onset: 79) in his father; Diabetes in his father, maternal grandmother, and mother; Emphysema in his maternal grandfather; Heart disease in his father; Hypertension in his father and mother; Liver cancer in his father; Lung cancer in his mother; Macular degeneration in his mother; Neuropathy in his mother; Parkinson's disease in his maternal grandmother; Stroke in his paternal grandfather. There is no history of Colon cancer, Esophageal cancer, Rectal cancer, Stomach cancer, or Colon polyps.  ROS:   Please see the history of present illness.  Additional pertinent ROS: Constitutional: Negative for chills, fever, night sweats, unintentional weight loss  HENT: Negative for ear pain and hearing loss.   Eyes: Negative for loss of vision and eye pain.  Respiratory: Negative for cough, sputum, wheezing.   Cardiovascular: See HPI. Gastrointestinal: Negative for abdominal pain, melena, and hematochezia.  Genitourinary: Negative for dysuria and hematuria.  Musculoskeletal: Negative for falls and myalgias.  Skin: Negative for itching and rash.  Neurological: Negative for  focal weakness, focal sensory changes and loss of consciousness.  Endo/Heme/Allergies: Does not bruise/bleed easily.     EKGs/Labs/Other Studies Reviewed:    The following studies were reviewed today: CT chest 10/08/18 US carotid 11/09/2016 Monitor 09/05/2016 ETT 06/22/16  EKG:  EKG is personally reviewed.  The ekg ordered today demonstrates sinus bradycardia at 52 bpm  Recent Labs: 09/26/2018: ALT 81; BUN 31; Creatinine, Ser 1.70; Hemoglobin 14.3; Platelets 267.0; Potassium 3.7; Sodium 137; TSH 2.23  Recent Lipid Panel    Component Value Date/Time   CHOL 190 09/26/2018 1054   TRIG 230.0 (H) 09/26/2018 1054   HDL 66.50 09/26/2018 1054   CHOLHDL 3 09/26/2018 1054   VLDL 46.0 (H) 09/26/2018 1054   LDLCALC 115 (H) 04/02/2018 1253   LDLDIRECT 91.0 09/26/2018 1054    Physical Exam:    VS:  BP 113/74   Pulse 69   Temp (!) 97.5 F (36.4 C)   Ht 6\' 3"  (1.905 m)   Wt 215 lb (97.5 kg)   SpO2 100%  BMI 26.87 kg/m     Wt Readings from Last 3 Encounters:  10/10/18 215 lb (97.5 kg)  09/26/18 218 lb 9.6 oz (99.2 kg)  06/26/18 228 lb (103.4 kg)    GEN: Well nourished, well developed in no acute distress HEENT: Normal, moist mucous membranes NECK: No JVD CARDIAC: regular rhythm, normal S1 and S2, no murmurs, rubs, gallops.  VASCULAR: Radial and DP pulses 2+ bilaterally. No carotid bruits RESPIRATORY:  Clear to auscultation without rales, wheezing or rhonchi  ABDOMEN: Soft, non-tender, non-distended MUSCULOSKELETAL:  Ambulates independently SKIN: Warm and dry, no edema NEUROLOGIC:  Alert and oriented x 3. No focal neuro deficits noted. PSYCHIATRIC:  Normal affect    ASSESSMENT:    1. Encounter to discuss test results   2. Essential hypertension   3. Cardiac risk counseling   4. Counseling on health promotion and disease prevention   5. Coronary artery calcification seen on CT scan   6. Pure hypercholesterolemia    PLAN:    Test results: reviewed calcium on his ct scan at  length today, reviewed images together -discussed pathology of cholesterol, how plaques form, that MI/CVA result commonly from acute plaque rupture and not gradual stenosis. Discussed mechanism of statin to both decrease plaque accumulation and stabilize plaque that is already present. Discussed that calcium is a marker for plaque. -used to frame CV risk and prevention counseling, below  Hypertension: at goal today, and home numbers echo good control -off amlodipine, will remove from list -continue HCTZ-lisinopril  Hypercholesterolemia: LDL 91, TG 230 (unclear if fasting) -continue atorvastatin 10 mg -lifestyle as below -discussed data on fish oil. Unclear if TG fasting or non. No clinical ASCVD, does not yet meet criteria for vascepa, discussed that we will continue to follow the guidelines/data on this.  Cardiac risk counseling and prevention recommendations: -recommend heart healthy/Mediterranean diet, with whole grains, fruits, vegetable, fish, lean meats, nuts, and olive oil. Limit salt. -recommend moderate walking, 3-5 times/week for 30-50 minutes each session. Aim for at least 150 minutes.week. Goal should be pace of 3 miles/hours, or walking 1.5 miles in 30 minutes -recommend avoidance of tobacco products. Avoid excess alcohol. -Additional risk factor control:  -Diabetes: A1c is 5.7  -Lipids: as above  -Blood pressure control: as above  -Weight: BMI 26, working on healthy BMI -ASCVD risk score: The 10-year ASCVD risk score Mikey Bussing DC Brooke Bonito., et al., 2013) is: 8.3%   Values used to calculate the score:     Age: 43 years     Sex: Male     Is Non-Hispanic African American: No     Diabetic: No     Tobacco smoker: No     Systolic Blood Pressure: 123456 mmHg     Is BP treated: Yes     HDL Cholesterol: 66.5 mg/dL     Total Cholesterol: 190 mg/dL    Plan for follow up: TIME SPENT WITH PATIENT: 45 minutes of direct patient care. More than 50% of that time was spent on coordination of care  and counseling regarding review of his test results (including reviewing actual images with him), discussion re: cholesterol and calcium, prevention recommendations and guidelines.  Buford Dresser, MD, PhD [place]  [facility]    Medication Adjustments/Labs and Tests Ordered: Current medicines are reviewed at length with the patient today.  Concerns regarding medicines are outlined above.  Orders Placed This Encounter  Procedures  . EKG 12-Lead   No orders of the defined types were placed in this encounter.  Patient Instructions  Medication Instructions:  Your Physician recommend you continue on your current medication as directed.    If you need a refill on your cardiac medications before your next appointment, please call your pharmacy.   Lab work: None  Testing/Procedures: None  Follow-Up: At [facility], you and your health needs are our priority.  As part of our continuing mission to provide you with exceptional heart care, we have created designated Provider Care Teams.  These Care Teams include your primary Cardiologist (physician) and Advanced Practice Providers (APPs -  Physician Assistants and Nurse Practitioners) who all work together to provide you with the care you need, when you need it. You will need a follow up appointment in 1 years.  Please call our office 2 months in advance to schedule this appointment.  You may see Dr. Harrell Gave or one of the following Advanced Practice Providers on your designated Care Team:   Rosaria Ferries, PA-C . Jory Sims, DNP, ANP     Signed, Buford Dresser, MD PhD 10/10/2018 10:23 PM    [place]

## 2018-10-16 ENCOUNTER — Encounter: Payer: Self-pay | Admitting: Cardiology

## 2018-10-16 DIAGNOSIS — E7800 Pure hypercholesterolemia, unspecified: Secondary | ICD-10-CM | POA: Insufficient documentation

## 2018-10-16 DIAGNOSIS — I2510 Atherosclerotic heart disease of native coronary artery without angina pectoris: Secondary | ICD-10-CM | POA: Insufficient documentation

## 2018-10-28 ENCOUNTER — Other Ambulatory Visit (INDEPENDENT_AMBULATORY_CARE_PROVIDER_SITE_OTHER): Payer: BC Managed Care – PPO

## 2018-10-28 ENCOUNTER — Other Ambulatory Visit: Payer: Self-pay

## 2018-10-28 DIAGNOSIS — R748 Abnormal levels of other serum enzymes: Secondary | ICD-10-CM | POA: Diagnosis not present

## 2018-10-28 LAB — CBC
HCT: 40.9 % (ref 39.0–52.0)
Hemoglobin: 13.8 g/dL (ref 13.0–17.0)
MCHC: 33.9 g/dL (ref 30.0–36.0)
MCV: 102.5 fl — ABNORMAL HIGH (ref 78.0–100.0)
Platelets: 272 10*3/uL (ref 150.0–400.0)
RBC: 3.99 Mil/uL — ABNORMAL LOW (ref 4.22–5.81)
RDW: 13.1 % (ref 11.5–15.5)
WBC: 5.5 10*3/uL (ref 4.0–10.5)

## 2018-10-28 LAB — COMPREHENSIVE METABOLIC PANEL
ALT: 87 U/L — ABNORMAL HIGH (ref 0–53)
AST: 62 U/L — ABNORMAL HIGH (ref 0–37)
Albumin: 4.6 g/dL (ref 3.5–5.2)
Alkaline Phosphatase: 46 U/L (ref 39–117)
BUN: 17 mg/dL (ref 6–23)
CO2: 32 mEq/L (ref 19–32)
Calcium: 9.8 mg/dL (ref 8.4–10.5)
Chloride: 98 mEq/L (ref 96–112)
Creatinine, Ser: 1.61 mg/dL — ABNORMAL HIGH (ref 0.40–1.50)
GFR: 43.43 mL/min — ABNORMAL LOW (ref >60.00)
Glucose, Bld: 104 mg/dL — ABNORMAL HIGH (ref 70–99)
Potassium: 4.4 mEq/L (ref 3.5–5.1)
Sodium: 138 mEq/L (ref 135–145)
Total Bilirubin: 0.8 mg/dL (ref 0.2–1.2)
Total Protein: 7.1 g/dL (ref 6.0–8.3)

## 2018-11-16 ENCOUNTER — Other Ambulatory Visit: Payer: Self-pay | Admitting: Family Medicine

## 2018-11-16 DIAGNOSIS — E785 Hyperlipidemia, unspecified: Secondary | ICD-10-CM

## 2018-11-25 ENCOUNTER — Other Ambulatory Visit: Payer: Self-pay | Admitting: Family Medicine

## 2019-01-05 ENCOUNTER — Encounter: Payer: Self-pay | Admitting: Family Medicine

## 2019-01-09 ENCOUNTER — Other Ambulatory Visit: Payer: Self-pay | Admitting: Family Medicine

## 2019-01-09 DIAGNOSIS — M79604 Pain in right leg: Secondary | ICD-10-CM

## 2019-01-13 ENCOUNTER — Encounter: Payer: Self-pay | Admitting: Family Medicine

## 2019-01-13 ENCOUNTER — Ambulatory Visit (HOSPITAL_BASED_OUTPATIENT_CLINIC_OR_DEPARTMENT_OTHER)
Admission: RE | Admit: 2019-01-13 | Discharge: 2019-01-13 | Disposition: A | Discharge status: Home / Self Care | Payer: BC Managed Care – PPO | Source: Ambulatory Visit | Attending: Family Medicine | Admitting: Family Medicine

## 2019-01-13 ENCOUNTER — Ambulatory Visit: Payer: BC Managed Care – PPO | Admitting: Family Medicine

## 2019-01-13 ENCOUNTER — Other Ambulatory Visit: Payer: Self-pay

## 2019-01-13 VITALS — BP 129/79 | HR 54 | Ht 75.0 in | Wt 217.0 lb

## 2019-01-13 DIAGNOSIS — R29898 Other symptoms and signs involving the musculoskeletal system: Secondary | ICD-10-CM | POA: Diagnosis not present

## 2019-01-13 DIAGNOSIS — R531 Weakness: Secondary | ICD-10-CM | POA: Diagnosis not present

## 2019-01-13 NOTE — Progress Notes (Signed)
Zachary Rowe - 64 y.o. male MRN [id number]  Date of birth: Dec 23, 1955  SUBJECTIVE:  Including CC & ROS.  Chief Complaint  Patient presents with  . Leg Pain    bilateral leg    Zachary Rowe is a 64 y.o. male that is presenting with bilateral lower extremity weakness.  This is been ongoing for a few years.  He had similar symptoms in 2015 upon chart review.  He feels like the symptoms are stayed the same.  He will be able to walk for a few steps and then feel like he is going to fall.  No history of surgery in the lower back.  It seems to only involve his thighs and his pelvic girdle.  No changes of the shoulders.  No radicular symptoms.  No other new or different medications.  Has not tried any formal physical therapy.  Denies any numbness or tingling.   Review of Systems See HPI   HISTORY: Past Medical, Surgical, Social, and Family History Reviewed & Updated per EMR.   Pertinent Historical Findings include:  Past Medical History:  Diagnosis Date  . 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 Back pain 04/02/2014  . Bilateral carotid bruits 11/07/2016  . Bradycardia 08/06/2016  . Chicken pox as a child  . Chronic sinusitis 01-03-2000  . Constipation 08/06/2016  . Depression with anxiety 05/07/2016  . Elevated BP 09/02/2010  . Family history of liver cancer   . Family history of lung cancer   . Family history of prostate cancer   . Fatigue 11/01/2010  . Hard of hearing    wears bilateral hearing aids  . History of chicken pox 09/02/2010  . History of measles 09/02/2010  . HTN (hypertension) 09/02/2010  . Hyperglycemia 08/04/2016  . Hyperlipidemia   . Hypertension   . Measles as a child  . Multiple allergies 09/02/2010  . Overweight(278.02) 09/02/2010  . Personal history of colonic polyps - adenoma 04/17/2007   04/2007 - 3 sigmoid polyps - at least 1 tubular adenoma  . Peyronie disease 10/14/2012  . Preventative health care 11/17/2012  . Raynaud disease 04/11/2011  . Thyroid disease      Past Surgical History:  Procedure Laterality Date  . COLONOSCOPY  03/06/2013   x2 repeated 2020  . SKIN BIOPSY     BB in forehead    No Known Allergies  Family History  Problem Relation Age of Onset  . Hypertension Mother   . Diabetes Mother        borderline  . Neuropathy Mother   . Macular degeneration Mother   . COPD Mother        recent pneumonia  . Cancer Mother        lung, refused chemo, tolerated radiation  . Lung cancer Mother   . Hypertension Father   . Diabetes Father        type 2  . Cancer Father 81       liver  . Liver cancer Father   . Heart disease Father   . Diabetes Maternal Grandmother   . Parkinson's disease Maternal Grandmother   . Emphysema Maternal Grandfather   . COPD Maternal Grandfather   . Stroke Paternal Grandfather   . Cancer Maternal Uncle        prostate cancer  . Colon cancer Neg Hx   . Esophageal cancer Neg Hx   . Rectal cancer Neg Hx   . Stomach cancer Neg Hx   . Colon polyps Neg  Hx      Social History   Socioeconomic History  . Marital status: Married    Spouse name: Not on file  . Number of children: Not on file  . Years of education: Not on file  . Highest education level: Not on file  Occupational History  . Not on file  Tobacco Use  . Smoking status: Former Smoker    Types: Cigarettes    Quit date: 01/03/2007    Years since quitting: 12.0  . Smokeless tobacco: Former Systems developer    Types: Chew  . Tobacco comment: dip  Substance and Sexual Activity  . Alcohol use: Yes    Alcohol/week: 15.0 - 20.0 standard drinks    Types: 15 - 20 Glasses of wine per week    Comment: occas drinks Scotch  . Drug use: No  . Sexual activity: Yes    Partners: Female    Comment: lives with wife  Other Topics Concern  . Not on file  Social History Narrative  . Not on file   Social Determinants of Health   Financial Resource Strain:   . Difficulty of Paying Living Expenses: Not on file  Food Insecurity:   . Worried About  Charity fundraiser in the Last Year: Not on file  . Ran Out of Food in the Last Year: Not on file  Transportation Needs:   . Lack of Transportation (Medical): Not on file  . Lack of Transportation (Non-Medical): Not on file  Physical Activity:   . Days of Exercise per Week: Not on file  . Minutes of Exercise per Session: Not on file  Stress:   . Feeling of Stress : Not on file  Social Connections:   . Frequency of Communication with Friends and Family: Not on file  . Frequency of Social Gatherings with Friends and Family: Not on file  . Attends Religious Services: Not on file  . Active Member of Clubs or Organizations: Not on file  . Attends Archivist Meetings: Not on file  . Marital Status: Not on file  Intimate Partner Violence:   . Fear of Current or Ex-Partner: Not on file  . Emotionally Abused: Not on file  . Physically Abused: Not on file  . Sexually Abused: Not on file     PHYSICAL EXAM:  VS: BP 129/79   Pulse (!) 54   Ht 6\' 3"  (1.905 m)   Wt 217 lb (98.4 kg)   BMI 27.12 kg/m  Physical Exam Gen: NAD, alert, cooperative with exam, well-appearing ENT: normal lips, normal nasal mucosa,  Eye: normal EOM, normal conjunctiva and lids Skin: no rashes, no areas of induration  Neuro: normal tone, normal sensation to touch Psych:  normal insight, alert and oriented MSK:  Right and left leg: No signs of atrophy. Good strength with resistance to hip flexion, knee flexion extension, plantarflexion and dorsiflexion. +2 deep tendon reflexes at the patella and Achilles. Negative straight leg raise bilaterally. Neurovascularly intact     ASSESSMENT & PLAN:   Weakness of both legs Has been occurring for some time.  Occurring bilaterally but has good strength on clinical exam.  Previous lab testing was unrevealing.  Possible for spinal stenosis.  Seems less likely for polymyalgia. -Sed rate, CRP and CK. -Lumbar x-ray. -If no identifying factor then would  consider MRI of the lumbar spine versus EMG of the lower extremities.

## 2019-01-13 NOTE — Patient Instructions (Signed)
Nice to meet you I will call with the results.   Please send me a message in MyChart with any questions or updates.  Please see me back in 4 weeks .   --Dr. Raeford Razor

## 2019-01-14 ENCOUNTER — Other Ambulatory Visit: Payer: Self-pay | Admitting: Family Medicine

## 2019-01-14 ENCOUNTER — Telehealth: Payer: Self-pay | Admitting: Family Medicine

## 2019-01-14 DIAGNOSIS — R29898 Other symptoms and signs involving the musculoskeletal system: Secondary | ICD-10-CM | POA: Insufficient documentation

## 2019-01-14 DIAGNOSIS — M4807 Spinal stenosis, lumbosacral region: Secondary | ICD-10-CM

## 2019-01-14 LAB — SEDIMENTATION RATE: Sed Rate: 13 mm/hr (ref 0–30)

## 2019-01-14 LAB — CK: Total CK: 112 U/L (ref 41–331)

## 2019-01-14 LAB — C-REACTIVE PROTEIN: CRP: 1 mg/L (ref 0–10)

## 2019-01-14 NOTE — Telephone Encounter (Signed)
Patient returning call for results 

## 2019-01-14 NOTE — Assessment & Plan Note (Signed)
Has been occurring for some time.  Occurring bilaterally but has good strength on clinical exam.  Previous lab testing was unrevealing.  Possible for spinal stenosis.  Seems less likely for polymyalgia. -Sed rate, CRP and CK. -Lumbar x-ray. -If no identifying factor then would consider MRI of the lumbar spine versus EMG of the lower extremities.

## 2019-01-14 NOTE — Telephone Encounter (Signed)
Spoke to patient and gave him result information as provided by the physician.

## 2019-01-14 NOTE — Telephone Encounter (Signed)
Left VM for patient. If he calls back please have him speak with a nurse/CMA and inform that his labs are normal. His xrays were unrevealing. We could consider pursing an MRi of the lumbar spine to look for spinal stenosis.   If any questions then please take the best time and phone number to call and I will try to call him back.   Rosemarie Ax, MD [facility] 01/14/2019, 11:00 AM

## 2019-01-23 ENCOUNTER — Encounter: Payer: Self-pay | Admitting: Family Medicine

## 2019-01-24 ENCOUNTER — Other Ambulatory Visit: Payer: Self-pay

## 2019-01-24 ENCOUNTER — Ambulatory Visit
Admission: RE | Admit: 2019-01-24 | Discharge: 2019-01-24 | Disposition: A | Discharge status: Home / Self Care | Payer: BC Managed Care – PPO | Source: Ambulatory Visit | Attending: Family Medicine | Admitting: Family Medicine

## 2019-01-24 DIAGNOSIS — M48061 Spinal stenosis, lumbar region without neurogenic claudication: Secondary | ICD-10-CM | POA: Diagnosis not present

## 2019-01-24 DIAGNOSIS — M5126 Other intervertebral disc displacement, lumbar region: Secondary | ICD-10-CM | POA: Diagnosis not present

## 2019-01-24 DIAGNOSIS — M4807 Spinal stenosis, lumbosacral region: Secondary | ICD-10-CM

## 2019-01-24 MED ORDER — GADOBENATE DIMEGLUMINE 529 MG/ML IV SOLN
20.0000 mL | Freq: Once | INTRAVENOUS | Status: AC | PRN
  Administered 2019-01-24: 20 mL via INTRAVENOUS

## 2019-01-27 ENCOUNTER — Other Ambulatory Visit: Payer: Self-pay

## 2019-01-27 ENCOUNTER — Telehealth (INDEPENDENT_AMBULATORY_CARE_PROVIDER_SITE_OTHER): Payer: BC Managed Care – PPO | Admitting: Family Medicine

## 2019-01-27 DIAGNOSIS — R29898 Other symptoms and signs involving the musculoskeletal system: Secondary | ICD-10-CM

## 2019-01-27 NOTE — Progress Notes (Signed)
Virtual Visit via Video Note  I connected with Zachary Rowe on 01/27/19 at  3:10 PM EST by a video enabled telemedicine application and verified that I am speaking with the correct person using two identifiers.   I discussed the limitations of evaluation and management by telemedicine and the availability of in person appointments. The patient expressed understanding and agreed to proceed.  History of Present Illness:  Zachary Rowe is a 64 year old male that is following up for his weakness in his lower extremity.  An MRI was completed which was revealing for spinal stenosis at L2-3 and L3-4.  He has had multiple falls and has had weakness in his thighs.  He denies any significant pain.   Observations/Objective:  Gen: NAD, alert, cooperative with exam, well-appearing   Assessment and Plan:  Weakness of both legs/Spinal stenosis:  MRI was revealing for spinal stenosis at 2 different levels.  He denies any significant pain but does endorse ongoing intermittent weakness that has seemed to worsen. -Referral to neurosurgery -Counseled on supportive care.  Follow Up Instructions:    I discussed the assessment and treatment plan with the patient. The patient was provided an opportunity to ask questions and all were answered. The patient agreed with the plan and demonstrated an understanding of the instructions.   The patient was advised to call back or seek an in-person evaluation if the symptoms worsen or if the condition fails to improve as anticipated.   Zachary Coots, MD

## 2019-01-27 NOTE — Assessment & Plan Note (Signed)
MRI was revealing for spinal stenosis at 2 different levels.  He denies any significant pain but does endorse ongoing intermittent weakness that has seemed to worsen. -Referral to neurosurgery -Counseled on supportive care.

## 2019-01-30 DIAGNOSIS — Z6828 Body mass index (BMI) 28.0-28.9, adult: Secondary | ICD-10-CM | POA: Diagnosis not present

## 2019-01-30 DIAGNOSIS — M48062 Spinal stenosis, lumbar region with neurogenic claudication: Secondary | ICD-10-CM | POA: Diagnosis not present

## 2019-01-30 DIAGNOSIS — I10 Essential (primary) hypertension: Secondary | ICD-10-CM | POA: Diagnosis not present

## 2019-02-15 DIAGNOSIS — T161XXA Foreign body in right ear, initial encounter: Secondary | ICD-10-CM | POA: Diagnosis not present

## 2019-02-15 DIAGNOSIS — Z6827 Body mass index (BMI) 27.0-27.9, adult: Secondary | ICD-10-CM | POA: Diagnosis not present

## 2019-02-23 ENCOUNTER — Other Ambulatory Visit: Payer: Self-pay | Admitting: Family Medicine

## 2019-02-26 ENCOUNTER — Encounter: Payer: Self-pay | Admitting: Family Medicine

## 2019-02-28 ENCOUNTER — Other Ambulatory Visit: Payer: Self-pay

## 2019-03-03 ENCOUNTER — Encounter: Payer: Self-pay | Admitting: Internal Medicine

## 2019-03-03 ENCOUNTER — Ambulatory Visit (HOSPITAL_BASED_OUTPATIENT_CLINIC_OR_DEPARTMENT_OTHER)
Admission: RE | Admit: 2019-03-03 | Discharge: 2019-03-03 | Disposition: A | Discharge status: Home / Self Care | Payer: BC Managed Care – PPO | Source: Ambulatory Visit | Attending: Internal Medicine | Admitting: Internal Medicine

## 2019-03-03 ENCOUNTER — Other Ambulatory Visit: Payer: Self-pay

## 2019-03-03 ENCOUNTER — Ambulatory Visit: Payer: BC Managed Care – PPO | Admitting: Internal Medicine

## 2019-03-03 VITALS — BP 134/72 | HR 88 | Temp 97.3°F | Resp 16 | Ht 75.0 in | Wt 224.4 lb

## 2019-03-03 DIAGNOSIS — Z0389 Encounter for observation for other suspected diseases and conditions ruled out: Secondary | ICD-10-CM | POA: Diagnosis not present

## 2019-03-03 DIAGNOSIS — N5089 Other specified disorders of the male genital organs: Secondary | ICD-10-CM | POA: Diagnosis not present

## 2019-03-03 NOTE — Patient Instructions (Signed)
    STOP BY THE FIRST FLOOR: Please to schedule the ultrasound  Otherwise watch the area, if that lump is increasing in size, hurting or getting harder please let us know

## 2019-03-03 NOTE — Progress Notes (Signed)
Pre visit review using our clinic review tool, if applicable. No additional management support is needed unless otherwise documented below in the visit note. 

## 2019-03-03 NOTE — Progress Notes (Signed)
Subjective:    Patient ID: Zachary Rowe, male    DOB: 04-06-1955, 64 y.o.   MRN: [id number]  DOS:  03/03/2019 Type of visit - description: Acute A week ago noted bump at the scrotum. It is soft, not hurting, not itching. He thinks is a cyst as he has a tendency to have cysts in other places.    Review of Systems Denies any LUTS  Past Medical History:  Diagnosis Date  . 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 Back pain 04/02/2014  . Bilateral carotid bruits 11/07/2016  . Bradycardia 08/06/2016  . Chicken pox as a child  . Chronic sinusitis 01-03-2000  . Constipation 08/06/2016  . Depression with anxiety 05/07/2016  . Elevated BP 09/02/2010  . Family history of liver cancer   . Family history of lung cancer   . Family history of prostate cancer   . Fatigue 11/01/2010  . Hard of hearing    wears bilateral hearing aids  . History of chicken pox 09/02/2010  . History of measles 09/02/2010  . HTN (hypertension) 09/02/2010  . Hyperglycemia 08/04/2016  . Hyperlipidemia   . Hypertension   . Measles as a child  . Multiple allergies 09/02/2010  . Overweight(278.02) 09/02/2010  . Personal history of colonic polyps - adenoma 04/17/2007   04/2007 - 3 sigmoid polyps - at least 1 tubular adenoma  . Peyronie disease 10/14/2012  . Preventative health care 11/17/2012  . Raynaud disease 04/11/2011  . Thyroid disease     Past Surgical History:  Procedure Laterality Date  . COLONOSCOPY  03/06/2013   x2 repeated 2020  . SKIN BIOPSY     BB in forehead    Allergies as of 03/03/2019   No Known Allergies     Medication List       Accurate as of March 03, 2019  1:56 PM. If you have any questions, ask your nurse or doctor.        atorvastatin 10 MG tablet Commonly known as: LIPITOR TAKE 1 TABLET BY MOUTH  DAILY   Azelastine HCl 0.15 % Soln USE 2 SPRAY(S) ONCE DAILY AS NEEDED   folic acid Q000111Q MCG tablet Commonly known as: FOLVITE Take 400 mcg by mouth daily.   levothyroxine 88 MCG  tablet Commonly known as: SYNTHROID TAKE 1 TABLET BY MOUTH ONCE DAILY BEFORE BREAKFAST   lisinopril-hydrochlorothiazide 20-25 MG tablet Commonly known as: ZESTORETIC TAKE 1 TABLET BY MOUTH  DAILY   thiamine 250 MG tablet Take 250 mg by mouth daily.             Objective:   Physical Exam BP 134/72 (BP Location: Left Arm, Patient Position: Sitting, Cuff Size: Normal)   Pulse 88   Temp (!) 97.3 F (36.3 C) (Temporal)   Resp 16   Ht 6\' 3"  (1.905 m)   Wt 224 lb 6 oz (101.8 kg)   SpO2 100%   BMI 28.04 kg/m  General:   Well developed, NAD, BMI noted. HEENT:  Normocephalic . Face symmetric, atraumatic GU: Penis uncircumcised, no skin lesions Scrotal contents: Normal testicles without lesions. On the right side, at the scrotum nearest to the anus has a oval lump,  2x1 cm soft & superficial . No inguinal lymph nodes Skin: Not pale. Not jaundice Neurologic:  alert & oriented X3.  Speech normal, gait appropriate for age and unassisted Psych--  Cognition and judgment appear intact.  Cooperative with normal attention span and concentration.  Behavior appropriate. No anxious or depressed  appearing.      Assessment    64 year old gentleman, PMH including HTN, high cholesterol, thyroid disease, spinal stenosis presents with.  Lump, scrotum. As described above, does not seem to be attached to the testicles, suspect a benign cyst, will get a ultrasound to be sure. If ultrasound is negative or benign then I would recommend observation only  This visit occurred during the SARS-CoV-2 public health emergency.  Safety protocols were in place, including screening questions prior to the visit, additional usage of staff PPE, and extensive cleaning of exam room while observing appropriate contact time as indicated for disinfecting solutions.

## 2019-03-10 DIAGNOSIS — M48062 Spinal stenosis, lumbar region with neurogenic claudication: Secondary | ICD-10-CM | POA: Diagnosis not present

## 2019-03-25 DIAGNOSIS — M48062 Spinal stenosis, lumbar region with neurogenic claudication: Secondary | ICD-10-CM | POA: Diagnosis not present

## 2019-04-04 ENCOUNTER — Ambulatory Visit: Payer: BC Managed Care – PPO | Attending: Internal Medicine

## 2019-04-04 DIAGNOSIS — Z23 Encounter for immunization: Secondary | ICD-10-CM

## 2019-04-04 NOTE — Progress Notes (Signed)
   Covid-19 Vaccination Clinic  Name:  Zachary Rowe    MRN: [id number] DOB: 1955-10-18  04/04/2019  [name] was observed post Covid-19 immunization for 15 minutes without incident. He was provided with Vaccine Information Sheet and instruction to access the V-Safe system.   [name] was instructed to call 911 with any severe reactions post vaccine: Marland Kitchen Difficulty breathing  . Swelling of face and throat  . A fast heartbeat  . A bad rash all over body  . Dizziness and weakness   Immunizations Administered    Name Date Dose VIS Date Route   Pfizer COVID-19 Vaccine 04/04/2019 10:20 AM 0.3 mL 12/13/2018 Intramuscular   Manufacturer: [employer], [employer]   Lot: [id number]   [place]: [id number]

## 2019-04-28 ENCOUNTER — Ambulatory Visit: Payer: BC Managed Care – PPO | Attending: Internal Medicine

## 2019-04-28 DIAGNOSIS — Z23 Encounter for immunization: Secondary | ICD-10-CM

## 2019-04-28 NOTE — Progress Notes (Signed)
   Covid-19 Vaccination Clinic  Name:  EULA HOEY    MRN: [id number] DOB: [birth year]  04/28/2019  Mr. Watrous was observed post Covid-19 immunization for 15 minutes without incident. He was provided with Vaccine Information Sheet and instruction to access the V-Safe system.   Mr. Xayavong was instructed to call 911 with any severe reactions post vaccine: Marland Kitchen Difficulty breathing  . Swelling of face and throat  . A fast heartbeat  . A bad rash all over body  . Dizziness and weakness   Immunizations Administered    Name Date Dose VIS Date Route   Pfizer COVID-19 Vaccine 04/28/2019 11:59 AM 0.3 mL 02/26/2018 Intramuscular   Manufacturer: [place]   Lot: [id number]   [place]: [id number]

## 2019-06-04 ENCOUNTER — Encounter: Payer: Self-pay | Admitting: Internal Medicine

## 2019-06-24 DIAGNOSIS — M48062 Spinal stenosis, lumbar region with neurogenic claudication: Secondary | ICD-10-CM | POA: Diagnosis not present

## 2019-07-10 ENCOUNTER — Other Ambulatory Visit: Payer: Self-pay

## 2019-07-10 ENCOUNTER — Ambulatory Visit (AMBULATORY_SURGERY_CENTER): Payer: Self-pay

## 2019-07-10 VITALS — Ht 75.0 in | Wt 225.0 lb

## 2019-07-10 DIAGNOSIS — Z86010 Personal history of colonic polyps: Secondary | ICD-10-CM

## 2019-07-10 NOTE — Progress Notes (Signed)
No egg or soy allergy known to patient  No issues with past sedation with any surgeries  or procedures, no intubation problems  No diet pills per patient No home 02 use per patient  No blood thinners per patient  Pt denies issues with constipation  No A fib or A flutter   COVID 19 guidelines implemented in [place] today   COVID vaccine completed 04/28/2019  Due to the COVID-19 pandemic we are asking patients to follow these guidelines. Please only bring one care partner. Please be aware that your care partner may wait in the car in the parking lot or if they feel like they will be too hot to wait in the car, they may wait in the lobby on the 4th floor. All care partners are required to wear a mask the entire time (we do not have any that we can provide them), they need to practice social distancing, and we will do a Covid check for all patient's and care partners when you arrive. Also we will check their temperature and your temperature. If the care partner waits in their car they need to stay in the parking lot the entire time and we will call them on their cell phone when the patient is ready for discharge so they can bring the car to the front of the building. Also all patient's will need to wear a mask into building.

## 2019-07-11 ENCOUNTER — Encounter: Payer: Self-pay | Admitting: Internal Medicine

## 2019-07-17 ENCOUNTER — Other Ambulatory Visit: Payer: Self-pay | Admitting: Family Medicine

## 2019-07-25 ENCOUNTER — Ambulatory Visit (AMBULATORY_SURGERY_CENTER): Payer: BC Managed Care – PPO | Admitting: Internal Medicine

## 2019-07-25 ENCOUNTER — Encounter: Payer: Self-pay | Admitting: Internal Medicine

## 2019-07-25 ENCOUNTER — Other Ambulatory Visit: Payer: Self-pay

## 2019-07-25 DIAGNOSIS — D125 Benign neoplasm of sigmoid colon: Secondary | ICD-10-CM

## 2019-07-25 DIAGNOSIS — D123 Benign neoplasm of transverse colon: Secondary | ICD-10-CM | POA: Diagnosis not present

## 2019-07-25 DIAGNOSIS — Z86010 Personal history of colonic polyps: Secondary | ICD-10-CM | POA: Diagnosis not present

## 2019-07-25 DIAGNOSIS — Z1211 Encounter for screening for malignant neoplasm of colon: Secondary | ICD-10-CM | POA: Diagnosis not present

## 2019-07-25 MED ORDER — SODIUM CHLORIDE 0.9 % IV SOLN: 500.0000 mL | Freq: Once | INTRAVENOUS | Status: DC

## 2019-07-25 NOTE — Op Note (Signed)
[facility] Patient Name: Zachary Rowe Procedure Date: 07/25/2019 11:12 AM MRN: [id number] Endoscopist: Gatha Mayer , MD Age: 64 Referring MD:  Date of Birth: Feb 15, 1955 Gender: Male Account #: [id number] Procedure:                Colonoscopy Indications:              Surveillance: History of numerous (> 10) adenomas                            on last colonoscopy (< 3 yrs) Medicines:                Propofol per Anesthesia, Monitored Anesthesia Care Procedure:                Pre-Anesthesia Assessment:                           - Prior to the procedure, a History and Physical                            was performed, and patient medications and                            allergies were reviewed. The patient's tolerance of                            previous anesthesia was also reviewed. The risks                            and benefits of the procedure and the sedation                            options and risks were discussed with the patient.                            All questions were answered, and informed consent                            was obtained. Prior Anticoagulants: The patient has                            taken no previous anticoagulant or antiplatelet                            agents. ASA Grade Assessment: III - A patient with                            severe systemic disease. After reviewing the risks                            and benefits, the patient was deemed in                            satisfactory condition to undergo the procedure.  After obtaining informed consent, the colonoscope                            was passed under direct vision. Throughout the                            procedure, the patient's blood pressure, pulse, and                            oxygen saturations were monitored continuously. The                            Colonoscope was introduced through the anus and                             advanced to the the cecum, identified by                            appendiceal orifice and ileocecal valve. The                            colonoscopy was performed without difficulty. The                            patient tolerated the procedure well. The quality                            of the bowel preparation was excellent. The bowel                            preparation used was Miralax via split dose                            instruction. The ileocecal valve, appendiceal                            orifice, and rectum were photographed. Scope In: 11:24:39 AM Scope Out: 36:62:94 AM Scope Withdrawal Time: 0 hours 15 minutes 28 seconds  Total Procedure Duration: 0 hours 22 minutes 39 seconds  Findings:                 The perianal and digital rectal examinations were                            normal.                           Two sessile polyps were found in the sigmoid colon                            and transverse colon. The polyps were diminutive in                            size. These polyps were removed with a cold snare.  Resection and retrieval were complete. Verification                            of patient identification for the specimen was                            done. Estimated blood loss was minimal.                           The exam was otherwise without abnormality on                            direct and retroflexion views. Complications:            No immediate complications. Estimated Blood Loss:     Estimated blood loss was minimal. Impression:               - Two diminutive polyps in the sigmoid colon and in                            the transverse colon, removed with a cold snare.                            Resected and retrieved.                           - The examination was otherwise normal on direct                            and retroflexion views.                           - Personal history of colonic polyps. 15 +  adenomas                            lifetime, genetic testing negative 2020 Recommendation:           - Patient has a contact number available for                            emergencies. The signs and symptoms of potential                            delayed complications were discussed with the                            patient. Return to normal activities tomorrow.                            Written discharge instructions were provided to the                            patient.                           - Resume previous diet.                           -  Continue present medications.                           - Repeat colonoscopy in 3 years for surveillance. Gatha Mayer, MD 07/25/2019 11:56:45 AM This report has been signed electronically.

## 2019-07-25 NOTE — Progress Notes (Signed)
Called to room to assist during endoscopic procedure.  Patient ID and intended procedure confirmed with present staff. Received instructions for my participation in the procedure from the performing physician.  

## 2019-07-25 NOTE — Progress Notes (Signed)
Pt's states no medical or surgical changes since previsit or office visit.  CW - vitals 

## 2019-07-25 NOTE — Progress Notes (Signed)
PT taken to PACU. Monitors in place. VSS. Report given to RN. 

## 2019-07-25 NOTE — Patient Instructions (Addendum)
Only 2 tiny polyps today. Will plan on repeat in 3 years.  I appreciate the opportunity to care for you. Gatha Mayer, MD, FACG    YOU HAD AN ENDOSCOPIC PROCEDURE TODAY AT [facility]:   Refer to the procedure report that was given to you for any specific questions about what was found during the examination.  If the procedure report does not answer your questions, please call your gastroenterologist to clarify.  If you requested that your care partner not be given the details of your procedure findings, then the procedure report has been included in a sealed envelope for you to review at your convenience later.  YOU SHOULD EXPECT: Some feelings of bloating in the abdomen. Passage of more gas than usual.  Walking can help get rid of the air that was put into your GI tract during the procedure and reduce the bloating. If you had a lower endoscopy (such as a colonoscopy or flexible sigmoidoscopy) you may notice spotting of blood in your stool or on the toilet paper. If you underwent a bowel prep for your procedure, you may not have a normal bowel movement for a few days.  Please Note:  You might notice some irritation and congestion in your nose or some drainage.  This is from the oxygen used during your procedure.  There is no need for concern and it should clear up in a day or so.  SYMPTOMS TO REPORT IMMEDIATELY:   Following lower endoscopy (colonoscopy or flexible sigmoidoscopy):  Excessive amounts of blood in the stool  Significant tenderness or worsening of abdominal pains  Swelling of the abdomen that is new, acute  Fever of 100F or higher  For urgent or emergent issues, a gastroenterologist can be reached at any hour by calling [phone]. Do not use MyChart messaging for urgent concerns.    DIET:  We do recommend a small meal at first, but then you may proceed to your regular diet.  Drink plenty of fluids but you should avoid alcoholic beverages for 24  hours.  ACTIVITY:  You should plan to take it easy for the rest of today and you should NOT DRIVE or use heavy machinery until tomorrow (because of the sedation medicines used during the test).    FOLLOW UP: Our staff will call the number listed on your records 48-72 hours following your procedure to check on you and address any questions or concerns that you may have regarding the information given to you following your procedure. If we do not reach you, we will leave a message.  We will attempt to reach you two times.  During this call, we will ask if you have developed any symptoms of COVID 19. If you develop any symptoms (ie: fever, flu-like symptoms, shortness of breath, cough etc.) before then, please call [phone].  If you test positive for Covid 19 in the 2 weeks post procedure, please call and report this information to [place].    If any biopsies were taken you will be contacted by phone or by letter within the next 1-3 weeks.  Please call us at [phone] if you have not heard about the biopsies in 3 weeks.    SIGNATURES/CONFIDENTIALITY: You and/or your care partner have signed paperwork which will be entered into your electronic medical record.  These signatures attest to the fact that that the information above on your After Visit Summary has been reviewed and is understood.  Full responsibility of the confidentiality  of this discharge information lies with you and/or your care-partner.

## 2019-07-29 ENCOUNTER — Telehealth: Payer: Self-pay

## 2019-07-29 NOTE — Telephone Encounter (Signed)
  Follow up Call-  Call back number 07/25/2019 06/26/2018  Post procedure Call Back phone  # [phone]  Permission to leave phone message Yes Yes  Some recent data might be hidden     Patient questions:  Do you have a fever, pain , or abdominal swelling? No. Pain Score  0 *  Have you tolerated food without any problems? Yes.    Have you been able to return to your normal activities? Yes.    Do you have any questions about your discharge instructions: Diet   No. Medications  No. Follow up visit  No.  Do you have questions or concerns about your Care? No.  Actions: * If pain score is 4 or above: No action needed, pain <4.  1. Have you developed a fever since your procedure? no  2.   Have you had an respiratory symptoms (SOB or cough) since your procedure? no  3.   Have you tested positive for COVID 19 since your procedure no  4.   Have you had any family members/close contacts diagnosed with the COVID 19 since your procedure?  no   If yes to any of these questions please route to Joylene John, RN and Erenest Rasher, RN

## 2019-07-29 NOTE — Telephone Encounter (Signed)
NO ANSWER, MESSAGE LEFT FOR PATIENT. 

## 2019-08-04 ENCOUNTER — Encounter: Payer: Self-pay | Admitting: Internal Medicine

## 2019-08-22 ENCOUNTER — Other Ambulatory Visit: Payer: Self-pay | Admitting: Family Medicine

## 2019-09-25 DIAGNOSIS — M5126 Other intervertebral disc displacement, lumbar region: Secondary | ICD-10-CM | POA: Diagnosis not present

## 2019-09-25 DIAGNOSIS — M48062 Spinal stenosis, lumbar region with neurogenic claudication: Secondary | ICD-10-CM | POA: Diagnosis not present

## 2019-10-15 ENCOUNTER — Encounter: Payer: Self-pay | Admitting: Cardiology

## 2019-10-15 ENCOUNTER — Other Ambulatory Visit: Payer: Self-pay

## 2019-10-15 ENCOUNTER — Ambulatory Visit: Payer: BC Managed Care – PPO | Admitting: Cardiology

## 2019-10-15 VITALS — BP 136/82 | HR 48 | Ht 75.0 in | Wt 227.0 lb

## 2019-10-15 DIAGNOSIS — I700 Atherosclerosis of aorta: Secondary | ICD-10-CM

## 2019-10-15 DIAGNOSIS — I2510 Atherosclerotic heart disease of native coronary artery without angina pectoris: Secondary | ICD-10-CM | POA: Diagnosis not present

## 2019-10-15 DIAGNOSIS — I10 Essential (primary) hypertension: Secondary | ICD-10-CM | POA: Diagnosis not present

## 2019-10-15 DIAGNOSIS — E7800 Pure hypercholesterolemia, unspecified: Secondary | ICD-10-CM

## 2019-10-15 DIAGNOSIS — Z7189 Other specified counseling: Secondary | ICD-10-CM

## 2019-10-15 NOTE — Progress Notes (Signed)
Cardiology Office Note:    Date:  10/15/2019   ID:  Zachary Rowe, DOB 1955-01-03, MRN [id number]  PCP:  Mosie Lukes, MD  Cardiologist:  Buford Dresser, MD  Referring MD: Mosie Lukes, MD   CC: follow up  History of Present Illness:    Zachary Rowe is a 64 y.o. male with a hx of hypertension, hyperlipidemia who is seen in follow up for management of hypertension, hyperlipidemia, and aortic atherosclerosis.   CV history: prior CT chest showed calcium in aortic arch, mild calcium left coronary system. Has hypertension, hyperlipidemia, former smoker  Today: No major issues or concerns today. Has some shortness of breath with heavy exertion, but not limiting. Walks on the treadmill when he can, has spinal stenosis and bone spurs that limit him walking more than 1/2 mile at a time. Trying to treat without surgery as much as he can.   Blood pressure slightly over goal today but has been well controlled in the past. Doesn't check at home. Has had to have amlodipine decreased in the past.   Was on aspirin in the past, bruised very easily, went off aspirin with improvement. We discussed aspirin when coronary calcium is seen. Discussed pros/cons. Has a history of hemorrhoidal bleeding. Had routine colonoscopy, has had polyps removed. After shared decision making, he would prefer to remain off of aspirin unless is it absolutely necessary.   Denies chest pain, shortness of breath at rest or with normal exertion. No PND, orthopnea, LE edema or unexpected weight gain. No syncope or palpitations.  Due for annual physical in another month or so, will have labs drawn then. Discussed that ideal LDL goal <70 given calcium, last 91 in 2020. If above goal, would increase dose of atorvastatin.  Past Medical History:  Diagnosis Date  . 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 Arthritis   . Back pain 04/02/2014  . Bilateral carotid bruits 11/07/2016  . Bradycardia 08/06/2016  . Chicken pox  as a child  . Chronic sinusitis 01-03-2000  . Constipation 08/06/2016  . Depression with anxiety 05/07/2016  . Elevated BP 09/02/2010  . Family history of liver cancer   . Family history of lung cancer   . Family history of prostate cancer   . Fatigue 11/01/2010  . Hard of hearing    wears bilateral hearing aids  . History of chicken pox 09/02/2010  . History of measles 09/02/2010  . HTN (hypertension) 09/02/2010  . Hyperglycemia 08/04/2016  . Hyperlipidemia   . Hypertension   . Measles as a child  . Multiple allergies 09/02/2010  . Overweight(278.02) 09/02/2010  . Personal history of colonic polyps - adenoma 04/17/2007   04/2007 - 3 sigmoid polyps - at least 1 tubular adenoma  . Peyronie disease 10/14/2012  . Preventative health care 11/17/2012  . Raynaud disease 04/11/2011  . Thyroid disease     Past Surgical History:  Procedure Laterality Date  . COLONOSCOPY  03/06/2013   x2 repeated 2020  . SKIN BIOPSY     BB in forehead    Current Medications: Current Outpatient Medications on File Prior to Visit  Medication Sig  . amLODipine (NORVASC) 5 MG tablet Take 5 mg by mouth daily.  Marland Kitchen atorvastatin (LIPITOR) 10 MG tablet TAKE 1 TABLET BY MOUTH  DAILY  . Azelastine HCl 0.15 % SOLN USE 2 SPRAY(S) ONCE DAILY AS NEEDED  . folic acid (FOLVITE) 073 MCG tablet Take 400 mcg by mouth daily.  Marland Kitchen levothyroxine (SYNTHROID) 88  MCG tablet TAKE 1 TABLET BY MOUTH ONCE DAILY BEFORE BREAKFAST  . lisinopril-hydrochlorothiazide (ZESTORETIC) 20-25 MG tablet TAKE 1 TABLET BY MOUTH  DAILY  . thiamine 250 MG tablet Take 250 mg by mouth daily.   No current facility-administered medications on file prior to visit.     Allergies:   Patient has no known allergies.   Social History   Tobacco Use  . Smoking status: Former Smoker    Types: Cigarettes    Quit date: 01/03/2007    Years since quitting: 12.7  . Smokeless tobacco: Former Systems developer    Types: Chew    Quit date: 2016  . Tobacco comment: dip  Vaping Use  .  Vaping Use: Never used  Substance Use Topics  . Alcohol use: Yes    Alcohol/week: 15.0 - 20.0 standard drinks    Types: 15 - 20 Glasses of wine per week    Comment: everday drinker  . Drug use: No    Family History: family history includes COPD in his maternal grandfather and mother; Cancer in his maternal uncle and mother; Cancer (age of onset: 25) in his father; Diabetes in his father, maternal grandmother, and mother; Emphysema in his maternal grandfather; Heart disease in his father; Hypertension in his father and mother; Liver cancer in his father; Lung cancer in his mother; Macular degeneration in his mother; Neuropathy in his mother; Parkinson's disease in his maternal grandmother; Stroke in his paternal grandfather. There is no history of Colon cancer, Esophageal cancer, Rectal cancer, Stomach cancer, or Colon polyps.  ROS:   Please see the history of present illness.  Additional pertinent ROS otherwise unremarkable.   EKGs/Labs/Other Studies Reviewed:    The following studies were reviewed today: CT chest 10/08/18 US carotid 11/09/2016 Monitor 09/05/2016 ETT 06/22/16  EKG:  EKG is personally reviewed.  The ekg ordered today demonstrates sinus bradycardia at 48 bpm  Recent Labs: 10/28/2018: ALT 87; BUN 17; Creatinine, Ser 1.61; Hemoglobin 13.8; Platelets 272.0; Potassium 4.4; Sodium 138  Recent Lipid Panel    Component Value Date/Time   CHOL 190 09/26/2018 1054   TRIG 230.0 (H) 09/26/2018 1054   HDL 66.50 09/26/2018 1054   CHOLHDL 3 09/26/2018 1054   VLDL 46.0 (H) 09/26/2018 1054   LDLCALC 115 (H) 04/02/2018 1253   LDLDIRECT 91.0 09/26/2018 1054    Physical Exam:    VS:  BP 136/82   Pulse (!) 48   Ht 6\' 3"  (1.905 m)   Wt 227 lb (103 kg)   SpO2 98%   BMI 28.37 kg/m     Wt Readings from Last 3 Encounters:  10/15/19 227 lb (103 kg)  07/25/19 (!) 225 lb (102.1 kg)  07/10/19 225 lb (102.1 kg)    GEN: Well nourished, well developed in no acute distress HEENT:  Normal, moist mucous membranes NECK: No JVD CARDIAC: regular rhythm, normal S1 and S2, no rubs or gallops. No murmur. VASCULAR: Radial and DP pulses 2+ bilaterally. No carotid bruits RESPIRATORY:  Clear to auscultation without rales, wheezing or rhonchi  ABDOMEN: Soft, non-tender, non-distended MUSCULOSKELETAL:  Ambulates independently SKIN: Warm and dry, no edema NEUROLOGIC:  Alert and oriented x 3. No focal neuro deficits noted. PSYCHIATRIC:  Normal affect   ASSESSMENT:    1. Coronary artery calcification seen on CT scan   2. Essential hypertension   3. Aortic atherosclerosis ([place])   4. Pure hypercholesterolemia   5. Cardiac risk counseling   6. Counseling on health promotion and disease prevention  PLAN:    Aortic atherosclerosis Coronary calcium on CT -his LDL was <100, but ideally would like <70 as this is a marker for plaque -he is due for upcoming annual physical -if LDL not <70, would increase atorvastatin to 20 mg daily -counseled on lifestyle today  Hypertension:  -continue lower dose of amlodipine, had to be reduced from 10 mg -continue HCTZ-lisinopril  Hypercholesterolemia: LDL 91, TG 230 in 2020 -as above, consider increasing atorvastatin dose if LDL remains >70 on recheck -discussed data on fish oil. Unclear if TG fasting or non. No clinical ASCVD, does not yet meet criteria for vascepa, discussed that we will continue to follow the guidelines/data on this. Recheck fasting lipids if possible to get accurate TG level  Cardiac risk counseling and prevention recommendations: -recommend heart healthy/Mediterranean diet, with whole grains, fruits, vegetable, fish, lean meats, nuts, and olive oil. Limit salt. -recommend moderate walking, 3-5 times/week for 30-50 minutes each session. Aim for at least 150 minutes.week. Goal should be pace of 3 miles/hours, or walking 1.5 miles in 30 minutes -recommend avoidance of tobacco products. Avoid excess alcohol. -ASCVD risk  score: The 10-year ASCVD risk score Mikey Bussing DC Brooke Bonito., et al., 2013) is: 12.4%   Values used to calculate the score:     Age: 23 years     Sex: Male     Is Non-Hispanic African American: No     Diabetic: No     Tobacco smoker: No     Systolic Blood Pressure: 324 mmHg     Is BP treated: Yes     HDL Cholesterol: 66.5 mg/dL     Total Cholesterol: 190 mg/dL    Plan for follow up: 1 year or sooner as needed  Buford Dresser, MD, PhD [place]  [facility]    Medication Adjustments/Labs and Tests Ordered: Current medicines are reviewed at length with the patient today.  Concerns regarding medicines are outlined above.  Orders Placed This Encounter  Procedures  . EKG 12-Lead   No orders of the defined types were placed in this encounter.   Patient Instructions  Medication Instructions:  Your Physician recommend you continue on your current medication as directed.    *If you need a refill on your cardiac medications before your next appointment, please call your pharmacy*   Lab Work: None ordered   Testing/Procedures: None ordered    Follow-Up: At [facility], you and your health needs are our priority.  As part of our continuing mission to provide you with exceptional heart care, we have created designated Provider Care Teams.  These Care Teams include your primary Cardiologist (physician) and Advanced Practice Providers (APPs -  Physician Assistants and Nurse Practitioners) who all work together to provide you with the care you need, when you need it.  We recommend signing up for the patient portal called "MyChart".  Sign up information is provided on this After Visit Summary.  MyChart is used to connect with patients for Virtual Visits (Telemedicine).  Patients are able to view lab/test results, encounter notes, upcoming appointments, etc.  Non-urgent messages can be sent to your provider as well.   To learn more about what you can do with MyChart, go to  [email or web address].    Your next appointment:   1 year(s)  The format for your next appointment:   In Person  Provider:   Buford Dresser, MD      Signed, Buford Dresser, MD PhD 10/15/2019 12:08 PM    [place]  [facility]

## 2019-10-15 NOTE — Patient Instructions (Signed)

## 2019-10-20 DIAGNOSIS — M48062 Spinal stenosis, lumbar region with neurogenic claudication: Secondary | ICD-10-CM | POA: Diagnosis not present

## 2019-11-26 ENCOUNTER — Other Ambulatory Visit: Payer: Self-pay | Admitting: Family Medicine

## 2019-11-26 DIAGNOSIS — E785 Hyperlipidemia, unspecified: Secondary | ICD-10-CM

## 2019-11-26 MED ORDER — AMLODIPINE BESYLATE 5 MG PO TABS: 5.0000 mg | ORAL_TABLET | Freq: Every day | ORAL | 0 refills | Status: DC

## 2019-12-15 ENCOUNTER — Other Ambulatory Visit: Payer: Self-pay | Admitting: Family Medicine

## 2019-12-15 ENCOUNTER — Encounter: Payer: Self-pay | Admitting: Family Medicine

## 2019-12-15 MED ORDER — AMLODIPINE BESYLATE 5 MG PO TABS: 5.0000 mg | ORAL_TABLET | Freq: Every day | ORAL | 1 refills | Status: DC

## 2020-01-22 ENCOUNTER — Other Ambulatory Visit: Payer: Self-pay | Admitting: Family Medicine

## 2020-02-24 ENCOUNTER — Encounter: Payer: BC Managed Care – PPO | Admitting: Family Medicine

## 2020-03-12 ENCOUNTER — Other Ambulatory Visit: Payer: Self-pay | Admitting: Family Medicine

## 2020-03-12 DIAGNOSIS — E785 Hyperlipidemia, unspecified: Secondary | ICD-10-CM

## 2020-05-18 ENCOUNTER — Other Ambulatory Visit: Payer: Self-pay | Admitting: Family Medicine

## 2020-07-19 ENCOUNTER — Ambulatory Visit (INDEPENDENT_AMBULATORY_CARE_PROVIDER_SITE_OTHER): Payer: Medicare Other | Admitting: Family Medicine

## 2020-07-19 ENCOUNTER — Encounter: Payer: Self-pay | Admitting: Family Medicine

## 2020-07-19 ENCOUNTER — Other Ambulatory Visit: Payer: Self-pay

## 2020-07-19 VITALS — BP 112/66 | HR 71 | Temp 98.4°F | Resp 16 | Ht 75.0 in | Wt 228.4 lb

## 2020-07-19 DIAGNOSIS — R351 Nocturia: Secondary | ICD-10-CM | POA: Diagnosis not present

## 2020-07-19 DIAGNOSIS — I10 Essential (primary) hypertension: Secondary | ICD-10-CM | POA: Diagnosis not present

## 2020-07-19 DIAGNOSIS — E7800 Pure hypercholesterolemia, unspecified: Secondary | ICD-10-CM | POA: Diagnosis not present

## 2020-07-19 DIAGNOSIS — Z0000 Encounter for general adult medical examination without abnormal findings: Secondary | ICD-10-CM

## 2020-07-19 DIAGNOSIS — R739 Hyperglycemia, unspecified: Secondary | ICD-10-CM | POA: Diagnosis not present

## 2020-07-19 DIAGNOSIS — R7989 Other specified abnormal findings of blood chemistry: Secondary | ICD-10-CM

## 2020-07-19 DIAGNOSIS — R229 Localized swelling, mass and lump, unspecified: Secondary | ICD-10-CM

## 2020-07-19 DIAGNOSIS — E663 Overweight: Secondary | ICD-10-CM

## 2020-07-19 DIAGNOSIS — N183 Chronic kidney disease, stage 3 unspecified: Secondary | ICD-10-CM

## 2020-07-19 NOTE — Progress Notes (Signed)
Subjective:   By signing my name below, I, Shehryar Baig, attest that this documentation has been prepared under the direction and in the presence of Dr. Mosie Lukes, MD. 07/19/2020   Patient ID: Zachary Rowe, male    DOB: 15-Jul-1955, 65 y.o.   MRN: [id number]  Chief Complaint  Patient presents with   Annual Exam    HPI Patient is in today for a comprehensive physical exam. He is reports of a mass behind his right shoulder that he found 2 months ago. He describes it as firm, non-tender, and no redness around the area. He denies having any recent falls or injuries recently.  He plans to talk to his neurosurgeon about the numbness and tingling in his legs and worsened pain around his left knee area. He reports having a acute episode of complete numbness followed by tingling from his mid-thigh to his mid-calf a couple of days ago. He reports having bumps on his right palm on the base of the 4th and 5th fingers. It is non-tender at this time. He is continues taking 5 mg amlodipine daily PO and reports that he is doing well on it. He does not check his blood pressure regularly.  BP Readings from Last 3 Encounters:  07/19/20 112/66  10/15/19 136/82  07/25/19 (!) 140/66   He denies having any fever, recent ER visits, loss of appetite, fatigue, dysuria, frequency, bowel troubles and chest pain at this time. He is working out twice a week at a gym and is trying to improve his diet.  He has two Pfizer Covid-19 vaccines at this time and is willing to get a booster before August 11, 2020.  He has gotten shingles in the past and is not willing to get the shingles vaccine at this time. He is UTD on his tetanus vaccines. He is willing to get the pneumonia vaccine at his pharmacy. He has not had a PSA screening this past year and is willing to get it during his next lab screening.  There has been no changes to his family history at this time. He reports hunting game as a hobby.   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   Arthritis    Back pain 04/02/2014   Bilateral carotid bruits 11/07/2016   Bradycardia 08/06/2016   Chicken pox as a child   Chronic sinusitis 01-03-2000   Constipation 08/06/2016   Depression with anxiety 05/07/2016   Elevated BP 09/02/2010   Family history of liver cancer    Family history of lung cancer    Family history of prostate cancer    Fatigue 11/01/2010   Hard of hearing    wears bilateral hearing aids   History of chicken pox 09/02/2010   History of measles 09/02/2010   HTN (hypertension) 09/02/2010   Hyperglycemia 08/04/2016   Hyperlipidemia    Hypertension    Measles as a child   Multiple allergies 09/02/2010   Overweight(278.02) 09/02/2010   Personal history of colonic polyps - adenoma 04/17/2007   04/2007 - 3 sigmoid polyps - at least 1 tubular adenoma   Peyronie disease 10/14/2012   Preventative health care 11/17/2012   Raynaud disease 04/11/2011   Thyroid disease     Past Surgical History:  Procedure Laterality Date   COLONOSCOPY  03/06/2013   x2 repeated 2020   SKIN BIOPSY     BB in forehead    Family History  Problem Relation Age of Onset  Hypertension Mother    Diabetes Mother        borderline   Neuropathy Mother    Macular degeneration Mother    COPD Mother        recent pneumonia   Cancer Mother        lung, refused chemo, tolerated radiation   Lung cancer Mother    Hypertension Father    Diabetes Father        type 2   Cancer Father 50       liver   Liver cancer Father    Heart disease Father    Diabetes Maternal Grandmother    Parkinson's disease Maternal Grandmother    Emphysema Maternal Grandfather    COPD Maternal Grandfather    Stroke Paternal Grandfather    Cancer Maternal Uncle        prostate cancer   Colon cancer Neg Hx    Esophageal cancer Neg Hx    Rectal cancer Neg Hx    Stomach cancer Neg Hx    Colon polyps Neg Hx     Social History   Socioeconomic History    Marital status: Married    Spouse name: Not on file   Number of children: Not on file   Years of education: Not on file   Highest education level: Not on file  Occupational History   Not on file  Tobacco Use   Smoking status: Former    Types: Cigarettes    Quit date: 01/03/2007    Years since quitting: 13.5   Smokeless tobacco: Former    Types: Chew    Quit date: 2016   Tobacco comments:    dip  Vaping Use   Vaping Use: Never used  Substance and Sexual Activity   Alcohol use: Yes    Alcohol/week: 15.0 - 20.0 standard drinks    Types: 15 - 20 Glasses of wine per week    Comment: everday drinker   Drug use: No   Sexual activity: Yes    Partners: Female    Comment: lives with wife  Other Topics Concern   Not on file  Social History Narrative   Not on file   Social Determinants of Health   Financial Resource Strain: Not on file  Food Insecurity: Not on file  Transportation Needs: Not on file  Physical Activity: Not on file  Stress: Not on file  Social Connections: Not on file  Intimate Partner Violence: Not on file    Outpatient Medications Prior to Visit  Medication Sig Dispense Refill   amLODipine (NORVASC) 5 MG tablet TAKE 1 TABLET BY MOUTH  DAILY 90 tablet 3   atorvastatin (LIPITOR) 10 MG tablet TAKE 1 TABLET BY MOUTH  DAILY 90 tablet 3   Azelastine HCl 0.15 % SOLN Place 2 sprays into the nose daily as needed. 30 mL 12   folic acid (FOLVITE) 650 MCG tablet Take 400 mcg by mouth daily.     levothyroxine (SYNTHROID) 88 MCG tablet TAKE 1 TABLET BY MOUTH  DAILY BEFORE BREAKFAST 90 tablet 3   lisinopril-hydrochlorothiazide (ZESTORETIC) 20-25 MG tablet TAKE 1 TABLET BY MOUTH  DAILY 90 tablet 3   thiamine 250 MG tablet Take 250 mg by mouth daily.     No facility-administered medications prior to visit.    No Known Allergies  Review of Systems  Constitutional:  Negative for fever and malaise/fatigue.  HENT:  Negative for congestion.   Eyes:        (-) vision  changes  Respiratory:  Negative for shortness of breath.   Cardiovascular:  Negative for chest pain and palpitations.  Gastrointestinal:  Negative for abdominal pain and nausea.  Genitourinary:  Negative for dysuria and frequency.  Skin:  Negative for rash.       (+)Mass behind right shoulder  Neurological:  Negative for headaches.  Endo/Heme/Allergies:  Negative for environmental allergies.  Psychiatric/Behavioral:  The patient is not nervous/anxious.       Objective:    Physical Exam Constitutional:      General: He is not in acute distress. HENT:     Head: Normocephalic and atraumatic.     Right Ear: Tympanic membrane, ear canal and external ear normal.     Left Ear: Tympanic membrane, ear canal and external ear normal.  Eyes:     Extraocular Movements: Extraocular movements intact.     Conjunctiva/sclera: Conjunctivae normal.     Pupils: Pupils are equal, round, and reactive to light.     Comments: (-)nystagmus  Cardiovascular:     Rate and Rhythm: Normal rate and regular rhythm.     Pulses: Normal pulses.     Heart sounds: Normal heart sounds. No murmur heard.   No gallop.  Pulmonary:     Effort: Pulmonary effort is normal. No respiratory distress.     Breath sounds: Normal breath sounds.  Abdominal:     General: Bowel sounds are normal. There is no distension.     Palpations: Abdomen is soft.     Tenderness: There is no abdominal tenderness.  Musculoskeletal:     Cervical back: Neck supple.     Comments: 5/5 strength in upper and lower extremities Bumps on the right palm 1 cm away from the 4th finger and 0.5 cm away from the base of the 5th finger.  Lymphadenopathy:     Cervical: No cervical adenopathy.  Skin:    General: Skin is warm and dry.     Comments: Mass behind right shoulder approximately 1 cm width x 2 cm length  Neurological:     Mental Status: He is alert and oriented to person, place, and time.     Deep Tendon Reflexes:     Reflex Scores:       Patellar reflexes are 2+ on the right side and 2+ on the left side. Psychiatric:        Behavior: Behavior normal.    BP 112/66   Pulse 71   Temp 98.4 F (36.9 C)   Resp 16   Ht 6\' 3"  (1.905 m)   Wt 228 lb 6.4 oz (103.6 kg)   SpO2 98%   BMI 28.55 kg/m  Wt Readings from Last 3 Encounters:  07/19/20 228 lb 6.4 oz (103.6 kg)  10/15/19 227 lb (103 kg)  07/25/19 (!) 225 lb (102.1 kg)    Diabetic Foot Exam - Simple   No data filed    Lab Results  Component Value Date   WBC 6.9 07/19/2020   HGB 14.2 07/19/2020   HCT 41.1 07/19/2020   PLT 269.0 07/19/2020   GLUCOSE 149 (H) 07/19/2020   CHOL 192 07/19/2020   TRIG 126.0 07/19/2020   HDL 72.90 07/19/2020   LDLDIRECT 91.0 09/26/2018   LDLCALC 94 07/19/2020   ALT 73 (H) 07/19/2020   AST 54 (H) 07/19/2020   NA 138 07/19/2020   K 3.8 07/19/2020   CL 98 07/19/2020   CREATININE 1.66 (H) 07/19/2020   BUN 23 07/19/2020   CO2 30 07/19/2020  TSH 2.58 07/19/2020   PSA 0.32 07/19/2020   HGBA1C 5.7 07/19/2020    Lab Results  Component Value Date   TSH 2.58 07/19/2020   Lab Results  Component Value Date   WBC 6.9 07/19/2020   HGB 14.2 07/19/2020   HCT 41.1 07/19/2020   MCV 97.6 07/19/2020   PLT 269.0 07/19/2020   Lab Results  Component Value Date   NA 138 07/19/2020   K 3.8 07/19/2020   CO2 30 07/19/2020   GLUCOSE 149 (H) 07/19/2020   BUN 23 07/19/2020   CREATININE 1.66 (H) 07/19/2020   BILITOT 0.8 07/19/2020   ALKPHOS 46 07/19/2020   AST 54 (H) 07/19/2020   ALT 73 (H) 07/19/2020   PROT 7.5 07/19/2020   ALBUMIN 4.8 07/19/2020   CALCIUM 10.2 07/19/2020   GFR 42.95 (L) 07/19/2020   Lab Results  Component Value Date   CHOL 192 07/19/2020   Lab Results  Component Value Date   HDL 72.90 07/19/2020   Lab Results  Component Value Date   LDLCALC 94 07/19/2020   Lab Results  Component Value Date   TRIG 126.0 07/19/2020   Lab Results  Component Value Date   CHOLHDL 3 07/19/2020   Lab Results   Component Value Date   HGBA1C 5.7 07/19/2020   Colonoscopy:Last done on 07/25/2019. Results showed two diminutive polyps in the sigmoid colon and the transverse colon which was removed with a cold snare. Otherwise results were normal. Repeat in 3 years. Dexa: Not yet completed. PSA: Not yet completed     Assessment & Plan:   Problem List Items Addressed This Visit     Essential hypertension - Primary (Chronic)    Well controlled, no changes to meds. Encouraged heart healthy diet such as the DASH diet and exercise as tolerated.        Relevant Orders   CBC (Completed)   Comprehensive metabolic panel (Completed)   TSH (Completed)   Overweight    Encouraged DASH or MIND diet, decrease po intake and increase exercise as tolerated. Needs 7-8 hours of sleep nightly. Avoid trans fats, eat small, frequent meals every 4-5 hours with lean proteins, complex carbs and healthy fats. Minimize simple carbs, high fat foods and processed foods       Hyperlipidemia    Encourage heart healthy diet such as MIND or DASH diet, increase exercise, avoid trans fats, simple carbohydrates and processed foods, consider a krill or fish or flaxseed oil cap daily.        Relevant Orders   Lipid panel (Completed)   Preventative health care    Patient encouraged to maintain heart healthy diet, regular exercise, adequate sleep. Consider daily probiotics. Take medications as prescribed. Labs ordered and reviewed.        Hyperglycemia    hgba1c acceptable, minimize simple carbs. Increase exercise as tolerated.        Relevant Orders   Hemoglobin A1c (Completed)   Nocturia   Relevant Orders   PSA (Completed)   CRI (chronic renal insufficiency), stage 3 (moderate) (HCC)    Hydrate and monitor       Subcutaneous mass    Posterior right shoulder, check an ultrasound. Report if changes.        Relevant Orders   [place] CHEST SOFT TISSUE   Elevated liver function tests    Minimize carbohydrates,  alcohol, processed foods and animal fats. Continue to monitor          No orders of the defined  types were placed in this encounter.   I, Dr. Charlett Blake, Bonnita Levan, MD., personally preformed the services described in this documentation.  All medical record entries made by the scribe were at my direction and in my presence.  I have reviewed the chart and discharge instructions (if applicable) and agree that the record reflects my personal performance and is accurate and complete. 07/19/2020   I,Shehryar Baig,acting as a scribe for Penni Homans, MD.,have documented all relevant documentation on the behalf of Penni Homans, MD,as directed by  Penni Homans, MD while in the presence of Penni Homans, MD.   Penni Homans, MD

## 2020-07-19 NOTE — Assessment & Plan Note (Signed)
Encourage heart healthy diet such as MIND or DASH diet, increase exercise, avoid trans fats, simple carbohydrates and processed foods, consider a krill or fish or flaxseed oil cap daily.  °

## 2020-07-19 NOTE — Patient Instructions (Addendum)
Molnupiravir or Paxlovid is the new COVID medication we can give you if you get COVID so make sure you test if you have symptoms because we have to treat by day 5 of symptoms for it to be effective. If you are positive let us know so we can treat. If a home test is negative and your symptoms are persistent get a PCR test. Can check testing locations at [email or web address] If you are positive we will make an appointment with [place] and we will send in Paxlovid if you would like it. Check with your pharmacy before we meet to confirm they have it in stock, if they do not then we can get the prescription at the [facility]   Shingrix is the new shingles shot, 2 shots over 2-6 months, confirm coverage with insurance and document, then can return here for shots with nurse appt or at pharmacy   Prevnar 20 one shot   Preventive Care 20-27 Years Old, Male Preventive care refers to lifestyle choices and visits with your health care provider that can promote health and wellness. This includes: A yearly physical exam. This is also called an annual wellness visit. Regular dental and eye exams. Immunizations. Screening for certain conditions. Healthy lifestyle choices, such as: Eating a healthy diet. Getting regular exercise. Not using drugs or products that contain nicotine and tobacco. Limiting alcohol use. What can I expect for my preventive care visit? Physical exam Your health care provider will check your: Height and weight. These may be used to calculate your BMI (body mass index). BMI is a measurement that tells if you are at a healthy weight. Heart rate and blood pressure. Body temperature. Skin for abnormal spots. Counseling Your health care provider may ask you questions about your: Past medical problems. Family's medical history. Alcohol, tobacco, and drug use. Emotional well-being. Home life and relationship well-being. Sexual activity. Diet, exercise, and sleep  habits. Work and work Statistician. Access to firearms. What immunizations do I need?  Vaccines are usually given at various ages, according to a schedule. Your health care provider will recommend vaccines for you based on your age, medicalhistory, and lifestyle or other factors, such as travel or where you work. What tests do I need? Blood tests Lipid and cholesterol levels. These may be checked every 5 years, or more often if you are over 43 years old. Hepatitis C test. Hepatitis B test. Screening Lung cancer screening. You may have this screening every year starting at age 53 if you have a 30-pack-year history of smoking and currently smoke or have quit within the past 15 years. Prostate cancer screening. Recommendations will vary depending on your family history and other risks. Genital exam to check for testicular cancer or hernias. Colorectal cancer screening. All adults should have this screening starting at age 42 and continuing until age 77. Your health care provider may recommend screening at age 85 if you are at increased risk. You will have tests every 1-10 years, depending on your results and the type of screening test. Diabetes screening. This is done by checking your blood sugar (glucose) after you have not eaten for a while (fasting). You may have this done every 1-3 years. STD (sexually transmitted disease) testing, if you are at risk. Follow these instructions at home: Eating and drinking  Eat a diet that includes fresh fruits and vegetables, whole grains, lean protein, and low-fat dairy products. Take vitamin and mineral supplements as recommended by your health care provider. Do not  drink alcohol if your health care provider tells you not to drink. If you drink alcohol: Limit how much you have to 0-2 drinks a day. Be aware of how much alcohol is in your drink. In the [place] one drink equals one 12 oz bottle of beer (355 mL), one 5 oz glass of wine (148 mL), or one 1  oz glass of hard liquor (44 mL).  Lifestyle Take daily care of your teeth and gums. Brush your teeth every morning and night with fluoride toothpaste. Floss one time each day. Stay active. Exercise for at least 30 minutes 5 or more days each week. Do not use any products that contain nicotine or tobacco, such as cigarettes, e-cigarettes, and chewing tobacco. If you need help quitting, ask your health care provider. Do not use drugs. If you are sexually active, practice safe sex. Use a condom or other form of protection to prevent STIs (sexually transmitted infections). If told by your health care provider, take low-dose aspirin daily starting at age 21. Find healthy ways to cope with stress, such as: Meditation, yoga, or listening to music. Journaling. Talking to a trusted person. Spending time with friends and family. Safety Always wear your seat belt while driving or riding in a vehicle. Do not drive: If you have been drinking alcohol. Do not ride with someone who has been drinking. When you are tired or distracted. While texting. Wear a helmet and other protective equipment during sports activities. If you have firearms in your house, make sure you follow all gun safety procedures. What's next? Go to your health care provider once a year for an annual wellness visit. Ask your health care provider how often you should have your eyes and teeth checked. Stay up to date on all vaccines. This information is not intended to replace advice given to you by your health care provider. Make sure you discuss any questions you have with your healthcare provider. Document Revised: 09/17/2018 Document Reviewed: 12/13/2017 Elsevier Patient Education  2022 [employer]. is the new COVID medication we can give you if you get COVID so make sure you test if you have symptoms because we have to treat by day 5 of symptoms for it to be effective. If you are positive let us know so we can treat. If a home  test is negative and your symptoms are persistent get a PCR test. Can check testing locations at [email or web address] If you are positive we will make an appointment with [place] and we will send in Paxlovid if you would like it. Check with your pharmacy before we meet to confirm they have it in stock, if they do not then we can get the prescription at the [facility]

## 2020-07-20 LAB — COMPREHENSIVE METABOLIC PANEL
ALT: 73 U/L — ABNORMAL HIGH (ref 0–53)
AST: 54 U/L — ABNORMAL HIGH (ref 0–37)
Albumin: 4.8 g/dL (ref 3.5–5.2)
Alkaline Phosphatase: 46 U/L (ref 39–117)
BUN: 23 mg/dL (ref 6–23)
CO2: 30 mEq/L (ref 19–32)
Calcium: 10.2 mg/dL (ref 8.4–10.5)
Chloride: 98 mEq/L (ref 96–112)
Creatinine, Ser: 1.66 mg/dL — ABNORMAL HIGH (ref 0.40–1.50)
GFR: 42.95 mL/min — ABNORMAL LOW (ref >60.00)
Glucose, Bld: 149 mg/dL — ABNORMAL HIGH (ref 70–99)
Potassium: 3.8 mEq/L (ref 3.5–5.1)
Sodium: 138 mEq/L (ref 135–145)
Total Bilirubin: 0.8 mg/dL (ref 0.2–1.2)
Total Protein: 7.5 g/dL (ref 6.0–8.3)

## 2020-07-20 LAB — LIPID PANEL
Cholesterol: 192 mg/dL (ref 0–200)
HDL: 72.9 mg/dL (ref >39.00)
LDL Cholesterol: 94 mg/dL (ref 0–99)
NonHDL: 119.05
Total CHOL/HDL Ratio: 3
Triglycerides: 126 mg/dL (ref 0.0–149.0)
VLDL: 25.2 mg/dL (ref 0.0–40.0)

## 2020-07-20 LAB — CBC
HCT: 41.1 % (ref 39.0–52.0)
Hemoglobin: 14.2 g/dL (ref 13.0–17.0)
MCHC: 34.6 g/dL (ref 30.0–36.0)
MCV: 97.6 fl (ref 78.0–100.0)
Platelets: 269 10*3/uL (ref 150.0–400.0)
RBC: 4.21 Mil/uL — ABNORMAL LOW (ref 4.22–5.81)
RDW: 13.5 % (ref 11.5–15.5)
WBC: 6.9 10*3/uL (ref 4.0–10.5)

## 2020-07-20 LAB — HEMOGLOBIN A1C: Hgb A1c MFr Bld: 5.7 % (ref 4.6–6.5)

## 2020-07-20 LAB — TSH: TSH: 2.58 u[IU]/mL (ref 0.35–5.50)

## 2020-07-20 LAB — PSA: PSA: 0.32 ng/mL (ref 0.10–4.00)

## 2020-07-21 DIAGNOSIS — R229 Localized swelling, mass and lump, unspecified: Secondary | ICD-10-CM | POA: Insufficient documentation

## 2020-07-21 DIAGNOSIS — R7989 Other specified abnormal findings of blood chemistry: Secondary | ICD-10-CM | POA: Insufficient documentation

## 2020-07-21 NOTE — Assessment & Plan Note (Signed)
Hydrate and monitor 

## 2020-07-21 NOTE — Assessment & Plan Note (Signed)
Minimize carbohydrates, alcohol, processed foods and animal fats. Continue to monitor

## 2020-07-21 NOTE — Assessment & Plan Note (Signed)
hgba1c acceptable, minimize simple carbs. Increase exercise as tolerated.  

## 2020-07-21 NOTE — Assessment & Plan Note (Signed)
Posterior right shoulder, check an ultrasound. Report if changes.

## 2020-07-21 NOTE — Assessment & Plan Note (Signed)
Patient encouraged to maintain heart healthy diet, regular exercise, adequate sleep. Consider daily probiotics. Take medications as prescribed. Labs ordered and reviewed 

## 2020-07-21 NOTE — Assessment & Plan Note (Signed)
Well controlled, no changes to meds. Encouraged heart healthy diet such as the DASH diet and exercise as tolerated.  °

## 2020-07-21 NOTE — Assessment & Plan Note (Signed)
Encouraged DASH or MIND diet, decrease po intake and increase exercise as tolerated. Needs 7-8 hours of sleep nightly. Avoid trans fats, eat small, frequent meals every 4-5 hours with lean proteins, complex carbs and healthy fats. Minimize simple carbs, high fat foods and processed foods 

## 2020-07-22 ENCOUNTER — Other Ambulatory Visit: Payer: Self-pay | Admitting: Family Medicine

## 2020-07-22 DIAGNOSIS — R109 Unspecified abdominal pain: Secondary | ICD-10-CM

## 2020-07-22 DIAGNOSIS — R7989 Other specified abnormal findings of blood chemistry: Secondary | ICD-10-CM

## 2020-07-22 DIAGNOSIS — F1011 Alcohol abuse, in remission: Secondary | ICD-10-CM

## 2020-07-27 ENCOUNTER — Other Ambulatory Visit: Payer: Self-pay

## 2020-07-27 ENCOUNTER — Ambulatory Visit (HOSPITAL_BASED_OUTPATIENT_CLINIC_OR_DEPARTMENT_OTHER)
Admission: RE | Admit: 2020-07-27 | Discharge: 2020-07-27 | Disposition: A | Discharge status: Home / Self Care | Payer: Medicare Other | Source: Ambulatory Visit | Attending: Family Medicine | Admitting: Family Medicine

## 2020-07-27 DIAGNOSIS — R7989 Other specified abnormal findings of blood chemistry: Secondary | ICD-10-CM | POA: Diagnosis present

## 2020-07-27 DIAGNOSIS — R109 Unspecified abdominal pain: Secondary | ICD-10-CM | POA: Diagnosis present

## 2020-07-27 DIAGNOSIS — F1011 Alcohol abuse, in remission: Secondary | ICD-10-CM

## 2020-07-27 DIAGNOSIS — R229 Localized swelling, mass and lump, unspecified: Secondary | ICD-10-CM | POA: Insufficient documentation

## 2020-10-08 ENCOUNTER — Other Ambulatory Visit: Payer: Self-pay | Admitting: Family Medicine

## 2020-10-13 ENCOUNTER — Ambulatory Visit (INDEPENDENT_AMBULATORY_CARE_PROVIDER_SITE_OTHER): Payer: Medicare Other | Admitting: Cardiology

## 2020-10-13 ENCOUNTER — Other Ambulatory Visit: Payer: Self-pay

## 2020-10-13 VITALS — BP 120/72 | HR 46 | Ht 75.0 in | Wt 233.0 lb

## 2020-10-13 DIAGNOSIS — E7800 Pure hypercholesterolemia, unspecified: Secondary | ICD-10-CM | POA: Diagnosis not present

## 2020-10-13 DIAGNOSIS — I700 Atherosclerosis of aorta: Secondary | ICD-10-CM | POA: Diagnosis not present

## 2020-10-13 DIAGNOSIS — I10 Essential (primary) hypertension: Secondary | ICD-10-CM | POA: Diagnosis not present

## 2020-10-13 DIAGNOSIS — Z7189 Other specified counseling: Secondary | ICD-10-CM

## 2020-10-13 DIAGNOSIS — I2510 Atherosclerotic heart disease of native coronary artery without angina pectoris: Secondary | ICD-10-CM | POA: Diagnosis not present

## 2020-10-13 DIAGNOSIS — Z79899 Other long term (current) drug therapy: Secondary | ICD-10-CM

## 2020-10-13 MED ORDER — ATORVASTATIN CALCIUM 20 MG PO TABS: 20.0000 mg | ORAL_TABLET | Freq: Every day | ORAL | 3 refills | Status: DC

## 2020-10-13 NOTE — Progress Notes (Signed)
Cardiology Office Note:    Date:  10/13/2020   ID:  Zachary Rowe, DOB Oct 09, 1955, MRN [id number]  PCP:  Mosie Lukes, MD  Cardiologist:  Buford Dresser, MD  Referring MD: Mosie Lukes, MD   CC: follow up  History of Present Illness:    Zachary Rowe is a 65 y.o. male with a hx of hypertension, hyperlipidemia who is seen in follow up for management of hypertension, hyperlipidemia, and aortic atherosclerosis.   CV history: prior CT chest showed calcium in aortic arch, mild calcium left coronary system. Has hypertension, hyperlipidemia, former smoker  Today: Overall he is feeling good with no new complaints. He continues to tolerate his medication.  Once in a while he has an episode of what he believes to be a "flutter." This occurs once every few months. When he feels this he monitors his heart rate which he finds is 45-55 bpm.  For activity he enjoys working in the yard often. Regularly he will use a treadmill, walking 1 mile in 20 minutes. He has noticed that his heart rate recovers quickly after exercising.  He denies any chest pain, or shortness of breath. No lightheadedness, headaches, syncope, orthopnea, or PND. Also has no lower extremity edema or exertional symptoms.    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   Arthritis    Back pain 04/02/2014   Bilateral carotid bruits 11/07/2016   Bradycardia 08/06/2016   Chicken pox as a child   Chronic sinusitis 01-03-2000   Constipation 08/06/2016   Depression with anxiety 05/07/2016   Elevated BP 09/02/2010   Family history of liver cancer    Family history of lung cancer    Family history of prostate cancer    Fatigue 11/01/2010   Hard of hearing    wears bilateral hearing aids   History of chicken pox 09/02/2010   History of measles 09/02/2010   HTN (hypertension) 09/02/2010   Hyperglycemia 08/04/2016   Hyperlipidemia    Hypertension    Measles as a child   Multiple allergies  09/02/2010   Overweight(278.02) 09/02/2010   Personal history of colonic polyps - adenoma 04/17/2007   04/2007 - 3 sigmoid polyps - at least 1 tubular adenoma   Peyronie disease 10/14/2012   Preventative health care 11/17/2012   Raynaud disease 04/11/2011   Thyroid disease     Past Surgical History:  Procedure Laterality Date   COLONOSCOPY  03/06/2013   x2 repeated 2020   SKIN BIOPSY     BB in forehead    Current Medications: Current Outpatient Medications on File Prior to Visit  Medication Sig   amLODipine (NORVASC) 5 MG tablet TAKE 1 TABLET BY MOUTH  DAILY   atorvastatin (LIPITOR) 10 MG tablet TAKE 1 TABLET BY MOUTH  DAILY   Azelastine HCl 0.15 % SOLN USE 2 SPRAY(S) ONCE DAILY AS NEEDED   folic acid (FOLVITE) 409 MCG tablet Take 400 mcg by mouth daily.   levothyroxine (SYNTHROID) 88 MCG tablet TAKE 1 TABLET BY MOUTH  DAILY BEFORE BREAKFAST   lisinopril-hydrochlorothiazide (ZESTORETIC) 20-25 MG tablet TAKE 1 TABLET BY MOUTH  DAILY   MELATONIN PO Take 1 tablet by mouth at bedtime.   omeprazole (PRILOSEC) 20 MG capsule Take 20 mg by mouth daily.   thiamine 250 MG tablet Take 250 mg by mouth daily.   No current facility-administered medications on file prior to visit.     Allergies:   Patient has no known  allergies.   Social History   Tobacco Use   Smoking status: Former    Types: Cigarettes    Quit date: 01/03/2007    Years since quitting: 13.7   Smokeless tobacco: Former    Types: Chew    Quit date: 2016   Tobacco comments:    dip  Vaping Use   Vaping Use: Never used  Substance Use Topics   Alcohol use: Yes    Alcohol/week: 15.0 - 20.0 standard drinks    Types: 15 - 20 Glasses of wine per week    Comment: everday drinker   Drug use: No    Family History: family history includes COPD in his maternal grandfather and mother; Cancer in his maternal uncle and mother; Cancer (age of onset: 35) in his father; Diabetes in his father, maternal grandmother, and mother;  Emphysema in his maternal grandfather; Heart disease in his father; Hypertension in his father and mother; Liver cancer in his father; Lung cancer in his mother; Macular degeneration in his mother; Neuropathy in his mother; Parkinson's disease in his maternal grandmother; Stroke in his paternal grandfather. There is no history of Colon cancer, Esophageal cancer, Rectal cancer, Stomach cancer, or Colon polyps.  ROS:   Please see the history of present illness.   (+) Palpitations Additional pertinent ROS otherwise unremarkable.   EKGs/Labs/Other Studies Reviewed:    The following studies were reviewed today:  CT chest 10/08/18 IMPRESSION: 1. Lung-RADS 2S, benign appearance or behavior. Continue annual screening with low-dose chest CT without contrast in 12 months. 2. The "S" modifier above refers to potentially clinically significant non lung cancer related findings. Specifically, there is aortic atherosclerosis, in addition to left anterior descending coronary artery disease. Please note that although the presence of coronary artery calcium documents the presence of coronary artery disease, the severity of this disease and any potential stenosis cannot be assessed on this non-gated CT examination. Assessment for potential risk factor modification, dietary therapy or pharmacologic therapy may be warranted, if clinically indicated. 3. Mild diffuse bronchial wall thickening with mild centrilobular and paraseptal emphysema; imaging findings suggestive of underlying COPD. 4. Hepatic steatosis.   Aortic Atherosclerosis (ICD10-I70.0) and Emphysema (ICD10-J43.9).  US carotid 11/09/2016 FINDINGS: Criteria: Quantification of carotid stenosis is based on velocity parameters that correlate the residual internal carotid diameter with NASCET-based stenosis levels, using the diameter of the distal internal carotid lumen as the denominator for stenosis measurement.   The following velocity  measurements were obtained:   RIGHT   ICA:  64 cm/sec   CCA:  2 4 cm/sec   SYSTOLIC ICA/CCA RATIO:  0.5   DIASTOLIC ICA/CCA RATIO:  0.8   ECA:  68 cm/sec   LEFT   ICA:  85 cm/sec   CCA:  643 cm/sec   SYSTOLIC ICA/CCA RATIO:  0.7   DIASTOLIC ICA/CCA RATIO:  1.0   ECA:  105 cm/sec   RIGHT CAROTID ARTERY: Minimal plaque in the bulb. Low resistance internal carotid Doppler pattern. Bradycardia.   RIGHT VERTEBRAL ARTERY:  Antegrade.   LEFT CAROTID ARTERY: Minimal calcified plaque in the bulb. Low resistance internal carotid Doppler pattern.   LEFT VERTEBRAL ARTERY:  Antegrade.   IMPRESSION: Less than 50% stenosis in the right and left internal carotid artery.  Monitor 09/05/2016 7 Day Event Monitor   Quality: Fair.  Baseline artifact. Predominant rhythm: sinus bradycardia Average heart rate: 51 bpm Max heart rate: 113 bpm Min heart rate: 38 bpm   Patient reported dizziness at which times sinus  rhythm and sinus bradycardia were noted.   During the episode of dizziness that was associated with bradycardia, his heart rate was 57 bpm.    ETT 06/22/16 Blood pressure demonstrated a normal response to exercise. There was no ST segment deviation noted during stress. Normal exercise stress test with no EKG criteria of ischemia. The patient exercised for 11.8 mets achieving 86% MPHR. He has no symtpoms. This is a low risk study.  EKG:  EKG is personally reviewed.   10/13/2020: sinus bradycardia at 46 bpm with low anterior forces 10/15/2019: sinus bradycardia at 48 bpm  Recent Labs: 07/19/2020: ALT 73; BUN 23; Creatinine, Ser 1.66; Hemoglobin 14.2; Platelets 269.0; Potassium 3.8; Sodium 138; TSH 2.58   Recent Lipid Panel    Component Value Date/Time   CHOL 192 07/19/2020 1452   TRIG 126.0 07/19/2020 1452   HDL 72.90 07/19/2020 1452   CHOLHDL 3 07/19/2020 1452   VLDL 25.2 07/19/2020 1452   LDLCALC 94 07/19/2020 1452   LDLDIRECT 91.0 09/26/2018 1054    Physical  Exam:    VS:  BP 120/72 (BP Location: Right Arm, Patient Position: Sitting, Cuff Size: Large)   Pulse (!) 46   Ht 6\' 3"  (1.905 m)   Wt 233 lb (105.7 kg)   BMI 29.12 kg/m     Wt Readings from Last 3 Encounters:  10/13/20 233 lb (105.7 kg)  07/19/20 228 lb 6.4 oz (103.6 kg)  10/15/19 227 lb (103 kg)    GEN: Well nourished, well developed in no acute distress HEENT: Normal, moist mucous membranes NECK: No JVD CARDIAC: regular rhythm, normal S1 and S2, no rubs or gallops. No murmur. VASCULAR: Radial and DP pulses 2+ bilaterally. No carotid bruits RESPIRATORY:  Clear to auscultation without rales, wheezing or rhonchi  ABDOMEN: Soft, non-tender, non-distended MUSCULOSKELETAL:  Ambulates independently SKIN: Warm and dry, no edema NEUROLOGIC:  Alert and oriented x 3. No focal neuro deficits noted. PSYCHIATRIC:  Normal affect   ASSESSMENT:    1. Essential hypertension   2. Pure hypercholesterolemia   3. Aortic atherosclerosis ([place])   4. Coronary artery calcification seen on CT scan   5. Medication management   6. Cardiac risk counseling     PLAN:    Aortic atherosclerosis Coronary calcium on CT Hypercholesterolemia -last LDL per [facility] 94 on 07/19/20 -his LDL was <100, but ideally would like <70 as this is a marker for plaque -will increase atorvastatin to 20 mg daily, recheck fasting lipids in 3 mos  Hypertension: BP at goal today -continue lower dose of amlodipine, had to be reduced from 10 mg -continue HCTZ-lisinopril  Cardiac risk counseling and prevention recommendations: -recommend heart healthy/Mediterranean diet, with whole grains, fruits, vegetable, fish, lean meats, nuts, and olive oil. Limit salt. -recommend moderate walking, 3-5 times/week for 30-50 minutes each session. Aim for at least 150 minutes.week. Goal should be pace of 3 miles/hours, or walking 1.5 miles in 30 minutes -recommend avoidance of tobacco products. Avoid excess alcohol.  Plan for follow up: 1  year or sooner as needed  Buford Dresser, MD, PhD [place]  [facility]    Medication Adjustments/Labs and Tests Ordered: Current medicines are reviewed at length with the patient today.  Concerns regarding medicines are outlined above.   Orders Placed This Encounter  Procedures   Lipid panel   EKG 12-Lead    Meds ordered this encounter  Medications   atorvastatin (LIPITOR) 20 MG tablet    Sig: Take 1 tablet (20 mg total) by  mouth daily.    Dispense:  90 tablet    Refill:  3    Patient Instructions  Medication Instructions:  INCREASE: Atorvastatin (Lipitor) to 20 mg daily  *If you need a refill on your cardiac medications before your next appointment, please call your pharmacy*   Lab Work: Your provider has recommended lab work in 3 months (Fasting lipid). Please have this collected at [facility] [place]. The lab is open 8:00 am - 4:30 pm. Please avoid 12:00p - 1:00p for lunch hour. You do not need an appointment. Please go to [address] [place], [place] [address]. This is in the Primary Care office on the 3rd floor, let them know you are there for blood work and they will direct you to the lab.   If you have labs (blood work) drawn today and your tests are completely normal, you will receive your results only by: [place] (if you have MyChart) OR A paper copy in the mail If you have any lab test that is abnormal or we need to change your treatment, we will call you to review the results.   Testing/Procedures: None ordered today   Follow-Up: At [facility], you and your health needs are our priority.  As part of our continuing mission to provide you with exceptional heart care, we have created designated Provider Care Teams.  These Care Teams include your primary Cardiologist (physician) and Advanced Practice Providers (APPs -  Physician Assistants and Nurse Practitioners) who all work together to provide you with  the care you need, when you need it.  We recommend signing up for the patient portal called "MyChart".  Sign up information is provided on this After Visit Summary.  MyChart is used to connect with patients for Virtual Visits (Telemedicine).  Patients are able to view lab/test results, encounter notes, upcoming appointments, etc.  Non-urgent messages can be sent to your provider as well.   To learn more about what you can do with MyChart, go to [email or web address].    Your next appointment:   1 year(s)  The format for your next appointment:   In Person  Provider:   Buford Dresser, MD    [facility] [place],acting as a scribe for Buford Dresser, MD.,have documented all relevant documentation on the behalf of Buford Dresser, MD,as directed by  Buford Dresser, MD while in the presence of Buford Dresser, MD.  I, Buford Dresser, MD, have reviewed all documentation for this visit. The documentation on 11/01/20 for the exam, diagnosis, procedures, and orders are all accurate and complete.   Signed, Buford Dresser, MD PhD 10/13/2020 12:41 PM    [place]

## 2020-10-13 NOTE — Patient Instructions (Signed)
Medication Instructions:  INCREASE: Atorvastatin (Lipitor) to 20 mg daily  *If you need a refill on your cardiac medications before your next appointment, please call your pharmacy*   Lab Work: Your provider has recommended lab work in 3 months (Fasting lipid). Please have this collected at [facility] [place]. The lab is open 8:00 am - 4:30 pm. Please avoid 12:00p - 1:00p for lunch hour. You do not need an appointment. Please go to [address] [place], [place] [address]. This is in the Primary Care office on the 3rd floor, let them know you are there for blood work and they will direct you to the lab.   If you have labs (blood work) drawn today and your tests are completely normal, you will receive your results only by: [place] (if you have MyChart) OR A paper copy in the mail If you have any lab test that is abnormal or we need to change your treatment, we will call you to review the results.   Testing/Procedures: None ordered today   Follow-Up: At [facility], you and your health needs are our priority.  As part of our continuing mission to provide you with exceptional heart care, we have created designated Provider Care Teams.  These Care Teams include your primary Cardiologist (physician) and Advanced Practice Providers (APPs -  Physician Assistants and Nurse Practitioners) who all work together to provide you with the care you need, when you need it.  We recommend signing up for the patient portal called "MyChart".  Sign up information is provided on this After Visit Summary.  MyChart is used to connect with patients for Virtual Visits (Telemedicine).  Patients are able to view lab/test results, encounter notes, upcoming appointments, etc.  Non-urgent messages can be sent to your provider as well.   To learn more about what you can do with MyChart, go to [email or web address].    Your next appointment:   1 year(s)  The format for your  next appointment:   In Person  Provider:   Buford Dresser, MD

## 2020-11-01 ENCOUNTER — Encounter (HOSPITAL_BASED_OUTPATIENT_CLINIC_OR_DEPARTMENT_OTHER): Payer: Self-pay | Admitting: Cardiology

## 2021-01-25 LAB — LIPID PANEL
Chol/HDL Ratio: 2.2 ratio (ref 0.0–5.0)
Cholesterol, Total: 166 mg/dL (ref 100–199)
HDL: 77 mg/dL (ref >39)
LDL Chol Calc (NIH): 70 mg/dL (ref 0–99)
Triglycerides: 105 mg/dL (ref 0–149)
VLDL Cholesterol Cal: 19 mg/dL (ref 5–40)

## 2021-02-26 ENCOUNTER — Other Ambulatory Visit: Payer: Self-pay | Admitting: Family Medicine

## 2021-03-11 ENCOUNTER — Other Ambulatory Visit (HOSPITAL_BASED_OUTPATIENT_CLINIC_OR_DEPARTMENT_OTHER): Payer: Self-pay

## 2021-03-11 ENCOUNTER — Encounter (HOSPITAL_BASED_OUTPATIENT_CLINIC_OR_DEPARTMENT_OTHER): Payer: Self-pay

## 2021-03-11 DIAGNOSIS — E7800 Pure hypercholesterolemia, unspecified: Secondary | ICD-10-CM

## 2021-03-11 MED ORDER — ATORVASTATIN CALCIUM 20 MG PO TABS: 20.0000 mg | ORAL_TABLET | Freq: Every day | ORAL | 3 refills | Status: DC

## 2021-04-27 ENCOUNTER — Encounter: Payer: Self-pay | Admitting: Family Medicine

## 2021-04-27 ENCOUNTER — Other Ambulatory Visit: Payer: Self-pay

## 2021-04-27 DIAGNOSIS — Z889 Allergy status to unspecified drugs, medicaments and biological substances status: Secondary | ICD-10-CM

## 2021-05-04 ENCOUNTER — Ambulatory Visit (INDEPENDENT_AMBULATORY_CARE_PROVIDER_SITE_OTHER): Payer: Medicare Other

## 2021-05-04 VITALS — Ht 75.0 in | Wt 220.0 lb

## 2021-05-04 DIAGNOSIS — Z0000 Encounter for general adult medical examination without abnormal findings: Secondary | ICD-10-CM | POA: Diagnosis not present

## 2021-05-04 NOTE — Progress Notes (Signed)
?I connected with [name] today by telephone and verified that I am speaking with the correct person using two identifiers. ?Location patient: home ?Location provider: work ?Persons participating in the virtual visit: Zachary Rowe, Bade LPN. ?  ?I discussed the limitations, risks, security and privacy concerns of performing an evaluation and management service by telephone and the availability of in person appointments. I also discussed with the patient that there may be a patient responsible charge related to this service. The patient expressed understanding and verbally consented to this telephonic visit.  ?  ?Interactive audio and video telecommunications were attempted between this provider and patient, however failed, due to patient having technical difficulties OR patient did not have access to video capability.  We continued and completed visit with audio only. ? ?  ? ?Vital signs may be patient reported or missing. ? ?Subjective:  ? Zachary Rowe is a 66 y.o. male who presents for an Initial Medicare Annual Wellness Visit. ? ?Review of Systems    ? ?Cardiac Risk Factors include: advanced age (>2mn, >>32women);hypertension;male gender ? ?   ?Objective:  ?  ?Today's Vitals  ? 05/04/21 1111  ?Weight: 220 lb (99.8 kg)  ?Height: '6\' 3"'$  (1.905 m)  ? ?Body mass index is 27.5 kg/m?. ? ? ?  05/04/2021  ? 11:17 AM 05/04/2016  ?  9:40 AM  ?Advanced Directives  ?Does Patient Have a Medical Advance Directive? Yes Yes  ?Type of [place]  ?Copy of [place]? Yes - validated most recent copy scanned in chart (See row information) No - copy requested  ? ? ?Current Medications (verified) ?Outpatient Encounter Medications as of 05/04/2021  ?Medication Sig  ? amLODipine (NORVASC) 5 MG tablet TAKE 1 TABLET BY MOUTH  DAILY  ? atorvastatin (LIPITOR) 20 MG tablet Take 1 tablet (20 mg total) by mouth daily.   ? Azelastine HCl 0.15 % SOLN USE 2 SPRAY(S) ONCE DAILY AS NEEDED  ? folic acid (FOLVITE) 8332MCG tablet Take 400 mcg by mouth daily.  ? levothyroxine (SYNTHROID) 88 MCG tablet TAKE 1 TABLET BY MOUTH  DAILY BEFORE BREAKFAST  ? lisinopril-hydrochlorothiazide (ZESTORETIC) 20-25 MG tablet TAKE 1 TABLET BY MOUTH  DAILY  ? MELATONIN PO Take 1 tablet by mouth at bedtime.  ? omeprazole (PRILOSEC) 20 MG capsule Take 20 mg by mouth daily.  ? thiamine 250 MG tablet Take 250 mg by mouth daily.  ? ?No facility-administered encounter medications on file as of 05/04/2021.  ? ? ?Allergies (verified) ?Patient has no known allergies.  ? ?History: ?Past Medical History:  ?Diagnosis Date  ? 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 Arthritis   ? Back pain 04/02/2014  ? Bilateral carotid bruits 11/07/2016  ? Bradycardia 08/06/2016  ? Chicken pox as a child  ? Chronic sinusitis 01-03-2000  ? Constipation 08/06/2016  ? Depression with anxiety 05/07/2016  ? Elevated BP 09/02/2010  ? Family history of liver cancer   ? Family history of lung cancer   ? Family history of prostate cancer   ? Fatigue 11/01/2010  ? Hard of hearing   ? wears bilateral hearing aids  ? History of chicken pox 09/02/2010  ? History of measles 09/02/2010  ? HTN (hypertension) 09/02/2010  ? Hyperglycemia 08/04/2016  ? Hyperlipidemia   ? Hypertension   ? Measles as a child  ? Multiple allergies 09/02/2010  ? Overweight(278.02) 09/02/2010  ? Personal history  of colonic polyps - adenoma 04/17/2007  ? 04/2007 - 3 sigmoid polyps - at least 1 tubular adenoma  ? Peyronie disease 10/14/2012  ? Preventative health care 11/17/2012  ? Raynaud disease 04/11/2011  ? Thyroid disease   ? ?Past Surgical History:  ?Procedure Laterality Date  ? COLONOSCOPY  03/06/2013  ? x2 repeated 2020  ? SKIN BIOPSY    ? BB in forehead  ? ?Family History  ?Problem Relation Age of Onset  ? Hypertension Mother   ? Diabetes Mother   ?     borderline  ? Neuropathy Mother   ? Macular degeneration Mother   ? COPD Mother   ?      recent pneumonia  ? Cancer Mother   ?     lung, refused chemo, tolerated radiation  ? Lung cancer Mother   ? Hypertension Father   ? Diabetes Father   ?     type 2  ? Cancer Father 5  ?     liver  ? Liver cancer Father   ? Heart disease Father   ? Diabetes Maternal Grandmother   ? Parkinson's disease Maternal Grandmother   ? Emphysema Maternal Grandfather   ? COPD Maternal Grandfather   ? Stroke Paternal Grandfather   ? Cancer Maternal Uncle   ?     prostate cancer  ? Colon cancer Neg Hx   ? Esophageal cancer Neg Hx   ? Rectal cancer Neg Hx   ? Stomach cancer Neg Hx   ? Colon polyps Neg Hx   ? ?Social History  ? ?Socioeconomic History  ? Marital status: Married  ?  Spouse name: Not on file  ? Number of children: Not on file  ? Years of education: Not on file  ? Highest education level: Not on file  ?Occupational History  ? Not on file  ?Tobacco Use  ? Smoking status: Former  ?  Types: Cigarettes  ?  Quit date: 01/03/2007  ?  Years since quitting: 14.3  ? Smokeless tobacco: Former  ?  Types: Chew  ?  Quit date: 2016  ? Tobacco comments:  ?  dip  ?Vaping Use  ? Vaping Use: Never used  ?Substance and Sexual Activity  ? Alcohol use: Yes  ?  Alcohol/week: 15.0 - 20.0 standard drinks  ?  Types: 15 - 20 Glasses of wine per week  ? Drug use: No  ? Sexual activity: Yes  ?  Partners: Female  ?  Comment: lives with wife  ?Other Topics Concern  ? Not on file  ?Social History Narrative  ? Not on file  ? ?Social Determinants of Health  ? ?Financial Resource Strain: Low Risk   ? Difficulty of Paying Living Expenses: Not hard at all  ?Food Insecurity: No Food Insecurity  ? Worried About Charity fundraiser in the Last Year: Never true  ? Ran Out of Food in the Last Year: Never true  ?Transportation Needs: No Transportation Needs  ? Lack of Transportation (Medical): No  ? Lack of Transportation (Non-Medical): No  ?Physical Activity: Sufficiently Active  ? Days of Exercise per Week: 3 days  ? Minutes of Exercise per Session: 60 min   ?Stress: No Stress Concern Present  ? Feeling of Stress : Not at all  ?Social Connections: Not on file  ? ? ?Tobacco Counseling ?Counseling given: Not Answered ?Tobacco comments: dip ? ? ?Clinical Intake: ? ?Pre-visit preparation completed: Yes ? ?Pain : No/denies pain ? ?  ? ?  Nutritional Status: BMI 25 -29 Overweight ?Nutritional Risks: None ?Diabetes: No ? ?How often do you need to have someone help you when you read instructions, pamphlets, or other written materials from your doctor or pharmacy?: 1 - Never ?What is the last grade level you completed in school?: 22yr  [employer] ? ?Diabetic? no ? ?Interpreter Needed?: No ? ?Information entered by :: NAllen LPN ? ? ?Activities of Daily Living ? ?  05/04/2021  ? 11:18 AM 05/03/2021  ?  4:02 PM  ?In your present state of health, do you have any difficulty performing the following activities:  ?Hearing? 1 1  ?Comment wears hearing aids   ?Vision? 0 0  ?Difficulty concentrating or making decisions? 0 0  ?Walking or climbing stairs? 0 0  ?Dressing or bathing? 0 0  ?Doing errands, shopping? 0 0  ?Preparing Food and eating ? N N  ?Using the Toilet? N N  ?In the past six months, have you accidently leaked urine? N N  ?Do you have problems with loss of bowel control? N N  ?Managing your Medications? N N  ?Managing your Finances? N N  ?Housekeeping or managing your Housekeeping? N N  ? ? ?Patient Care Team: ?BMosie Lukes MD as PCP - General (Family Medicine) ?CBuford Dresser MD as PCP - Cardiology (Cardiology) ?GGatha Mayer MD as Consulting Physician (Gastroenterology) ?TMelina Schools OD as Consulting Physician (Optometry) ? ?Indicate any recent Medical Services you may have received from other than [facility] providers in the past year (date may be approximate). ? ?   ?Assessment:  ? [facility] ? ?Hearing/Vision screen ?Vision Screening - Comments:: Regular eye exams, [facility] ? ?Dietary issues and exercise activities  discussed: ?Current Exercise Habits: Structured exercise class, Type of exercise: calisthenics;strength training/weights, Time (Minutes): 60, Frequency (Times/Week): 3, Weekly Exercise (Minutes/Week): 180 ?

## 2021-05-04 NOTE — Patient Instructions (Signed)
Mr. Gallaga , ?Thank you for taking time to come for your Medicare Wellness Visit. I appreciate your ongoing commitment to your health goals. Please review the following plan we discussed and let me know if I can assist you in the future.  ? ?Screening recommendations/referrals: ?Colonoscopy: completed 07/25/2019, due 07/25/2022 ?Recommended yearly ophthalmology/optometry visit for glaucoma screening and checkup ?Recommended yearly dental visit for hygiene and checkup ? ?Vaccinations: ?Influenza vaccine: due 08/02/2021 ?Pneumococcal vaccine: decline ?Tdap vaccine: completed 05/04/2016, due 05/05/2026 ?Shingles vaccine: decline   ?Covid-19:  07/29/2020, 04/28/2019, 04/04/2019 ? ?Advanced directives: copy in chart ? ?Conditions/risks identified: none ? ?Next appointment: Follow up in one year for your annual wellness visit.  ? ?Preventive Care 67 Years and Older, Male ?Preventive care refers to lifestyle choices and visits with your health care provider that can promote health and wellness. ?What does preventive care include? ?A yearly physical exam. This is also called an annual well check. ?Dental exams once or twice a year. ?Routine eye exams. Ask your health care provider how often you should have your eyes checked. ?Personal lifestyle choices, including: ?Daily care of your teeth and gums. ?Regular physical activity. ?Eating a healthy diet. ?Avoiding tobacco and drug use. ?Limiting alcohol use. ?Practicing safe sex. ?Taking low doses of aspirin every day. ?Taking vitamin and mineral supplements as recommended by your health care provider. ?What happens during an annual well check? ?The services and screenings done by your health care provider during your annual well check will depend on your age, overall health, lifestyle risk factors, and family history of disease. ?Counseling  ?Your health care provider may ask you questions about your: ?Alcohol use. ?Tobacco use. ?Drug use. ?Emotional well-being. ?Home and relationship  well-being. ?Sexual activity. ?Eating habits. ?History of falls. ?Memory and ability to understand (cognition). ?Work and work Statistician. ?Screening  ?You may have the following tests or measurements: ?Height, weight, and BMI. ?Blood pressure. ?Lipid and cholesterol levels. These may be checked every 5 years, or more frequently if you are over 61 years old. ?Skin check. ?Lung cancer screening. You may have this screening every year starting at age 55 if you have a 30-pack-year history of smoking and currently smoke or have quit within the past 15 years. ?Fecal occult blood test (FOBT) of the stool. You may have this test every year starting at age 50. ?Flexible sigmoidoscopy or colonoscopy. You may have a sigmoidoscopy every 5 years or a colonoscopy every 10 years starting at age 32. ?Prostate cancer screening. Recommendations will vary depending on your family history and other risks. ?Hepatitis C blood test. ?Hepatitis B blood test. ?Sexually transmitted disease (STD) testing. ?Diabetes screening. This is done by checking your blood sugar (glucose) after you have not eaten for a while (fasting). You may have this done every 1-3 years. ?Abdominal aortic aneurysm (AAA) screening. You may need this if you are a current or former smoker. ?Osteoporosis. You may be screened starting at age 106 if you are at high risk. ?Talk with your health care provider about your test results, treatment options, and if necessary, the need for more tests. ?Vaccines  ?Your health care provider may recommend certain vaccines, such as: ?Influenza vaccine. This is recommended every year. ?Tetanus, diphtheria, and acellular pertussis (Tdap, Td) vaccine. You may need a Td booster every 10 years. ?Zoster vaccine. You may need this after age 64. ?Pneumococcal 13-valent conjugate (PCV13) vaccine. One dose is recommended after age 12. ?Pneumococcal polysaccharide (PPSV23) vaccine. One dose is recommended after age 18. ?  Talk to your health care  provider about which screenings and vaccines you need and how often you need them. ?This information is not intended to replace advice given to you by your health care provider. Make sure you discuss any questions you have with your health care provider. ?Document Released: 01/15/2015 Document Revised: 09/08/2015 Document Reviewed: 10/20/2014 ?Elsevier Interactive Patient Education ? 2017 [place]. ? ?Fall Prevention in the Home ?Falls can cause injuries. They can happen to people of all ages. There are many things you can do to make your home safe and to help prevent falls. ?What can I do on the outside of my home? ?Regularly fix the edges of walkways and driveways and fix any cracks. ?Remove anything that might make you trip as you walk through a door, such as a raised step or threshold. ?Trim any bushes or trees on the path to your home. ?Use bright outdoor lighting. ?Clear any walking paths of anything that might make someone trip, such as rocks or tools. ?Regularly check to see if handrails are loose or broken. Make sure that both sides of any steps have handrails. ?Any raised decks and porches should have guardrails on the edges. ?Have any leaves, snow, or ice cleared regularly. ?Use sand or salt on walking paths during winter. ?Clean up any spills in your garage right away. This includes oil or grease spills. ?What can I do in the bathroom? ?Use night lights. ?Install grab bars by the toilet and in the tub and shower. Do not use towel bars as grab bars. ?Use non-skid mats or decals in the tub or shower. ?If you need to sit down in the shower, use a plastic, non-slip stool. ?Keep the floor dry. Clean up any water that spills on the floor as soon as it happens. ?Remove soap buildup in the tub or shower regularly. ?Attach bath mats securely with double-sided non-slip rug tape. ?Do not have throw rugs and other things on the floor that can make you trip. ?What can I do in the bedroom? ?Use night lights. ?Make  sure that you have a light by your bed that is easy to reach. ?Do not use any sheets or blankets that are too big for your bed. They should not hang down onto the floor. ?Have a firm chair that has side arms. You can use this for support while you get dressed. ?Do not have throw rugs and other things on the floor that can make you trip. ?What can I do in the kitchen? ?Clean up any spills right away. ?Avoid walking on wet floors. ?Keep items that you use a lot in easy-to-reach places. ?If you need to reach something above you, use a strong step stool that has a grab bar. ?Keep electrical cords out of the way. ?Do not use floor polish or wax that makes floors slippery. If you must use wax, use non-skid floor wax. ?Do not have throw rugs and other things on the floor that can make you trip. ?What can I do with my stairs? ?Do not leave any items on the stairs. ?Make sure that there are handrails on both sides of the stairs and use them. Fix handrails that are broken or loose. Make sure that handrails are as long as the stairways. ?Check any carpeting to make sure that it is firmly attached to the stairs. Fix any carpet that is loose or worn. ?Avoid having throw rugs at the top or bottom of the stairs. If you do have throw  rugs, attach them to the floor with carpet tape. ?Make sure that you have a light switch at the top of the stairs and the bottom of the stairs. If you do not have them, ask someone to add them for you. ?What else can I do to help prevent falls? ?Wear shoes that: ?Do not have high heels. ?Have rubber bottoms. ?Are comfortable and fit you well. ?Are closed at the toe. Do not wear sandals. ?If you use a stepladder: ?Make sure that it is fully opened. Do not climb a closed stepladder. ?Make sure that both sides of the stepladder are locked into place. ?Ask someone to hold it for you, if possible. ?Clearly mark and make sure that you can see: ?Any grab bars or handrails. ?First and last steps. ?Where the  edge of each step is. ?Use tools that help you move around (mobility aids) if they are needed. These include: ?Canes. ?Walkers. ?Scooters. ?Crutches. ?Turn on the lights when you go into a dark area. Repla

## 2021-05-17 ENCOUNTER — Other Ambulatory Visit: Payer: Self-pay | Admitting: Family Medicine

## 2021-05-17 ENCOUNTER — Encounter: Payer: Self-pay | Admitting: *Deleted

## 2021-06-28 NOTE — Progress Notes (Signed)
NEW PATIENT Date of Service/Encounter:  06/30/21 Referring provider: Mosie Lukes, MD Primary care provider: Mosie Lukes, MD  Subjective:  Zachary Rowe is a 66 y.o. male with a PMHx of carotid artery disease, HTN, thyroid disease, hyperlipidemia, Raynaud, colon polyps, CKD, BPH presenting today for evaluation of "allergies". History obtained from: chart review and patient.   Chronic rhinitis:  He was tested in the 90s for over 100 allergens and this was negative.  He was told at that time he had chronic sinusitis based on MRI. He used affrin for a long time, but then switched to INCS.  He stopped this after being told his eye pressure was elevated by ophthalmologist so he was switched to another medication. His sinuses still continue to bother him.  Some days he feels he is fine, but other days, he needs his nasal spays to be able to breath thought his nose.  Has increased head congestion and pressure.   Sometimes symptoms worse at season changes or when it is a windy day or changes in barometric pressure. He is not usually treated with antibiotics for these symptoms.  He is using azelastine nasal spray 2-3 sprays each side up to 2 times daily.  Not an every day thing. No previous sinus surgeries.  Last sinus imaging in 1993.  Not currently followed by ENT.  He does have reflux/heartburn which is improved on prilosec 20 mg daily which seems to help a little.  Been taking this for about 6 or 7 months.    Patient message on 04/27/21-patient requesting allergy referral for testing  Other allergy screening: Asthma: no Food allergy: no Medication allergy: no Hymenoptera allergy: no Eczema:no History of recurrent infections suggestive of immunodeficency: no Vaccinations - had COVID vaccine, 1 booster, gets flu shots, childhood vaccines.  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   Arthritis    Back pain 04/02/2014    Bilateral carotid bruits 11/07/2016   Bradycardia 08/06/2016   Chicken pox as a child   Chronic sinusitis 01-03-2000   Constipation 08/06/2016   Depression with anxiety 05/07/2016   Elevated BP 09/02/2010   Family history of liver cancer    Family history of lung cancer    Family history of prostate cancer    Fatigue 11/01/2010   Hard of hearing    wears bilateral hearing aids   History of chicken pox 09/02/2010   History of measles 09/02/2010   HTN (hypertension) 09/02/2010   Hyperglycemia 08/04/2016   Hyperlipidemia    Hypertension    Measles as a child   Multiple allergies 09/02/2010   Overweight(278.02) 09/02/2010   Personal history of colonic polyps - adenoma 04/17/2007   04/2007 - 3 sigmoid polyps - at least 1 tubular adenoma   Peyronie disease 10/14/2012   Preventative health care 11/17/2012   Raynaud disease 04/11/2011   Thyroid disease    Medication List:  Current Outpatient Medications  Medication Sig Dispense Refill   amLODipine (NORVASC) 5 MG tablet TAKE 1 TABLET BY MOUTH  DAILY 90 tablet 0   atorvastatin (LIPITOR) 20 MG tablet Take 1 tablet (20 mg total) by mouth daily. 90 tablet 3   Azelastine HCl 0.15 % SOLN USE 2 SPRAY(S) ONCE DAILY AS NEEDED 30 mL 5   folic acid (FOLVITE) 154 MCG tablet Take 400 mcg by mouth daily.     levothyroxine (SYNTHROID) 88 MCG tablet TAKE 1 TABLET BY MOUTH  DAILY BEFORE  BREAKFAST 90 tablet 1   lisinopril-hydrochlorothiazide (ZESTORETIC) 20-25 MG tablet TAKE 1 TABLET BY MOUTH  DAILY 90 tablet 1   MELATONIN PO Take 1 tablet by mouth at bedtime.     omeprazole (PRILOSEC) 20 MG capsule Take 20 mg by mouth daily.     thiamine 250 MG tablet Take 250 mg by mouth daily.     No current facility-administered medications for this visit.   Known Allergies:  No Known Allergies Past Surgical History: Past Surgical History:  Procedure Laterality Date   COLONOSCOPY  03/06/2013   x2 repeated 2020   SKIN BIOPSY     BB in forehead   Family History: Family  History  Problem Relation Age of Onset   Hypertension Mother    Diabetes Mother        borderline   Neuropathy Mother    Macular degeneration Mother    COPD Mother        recent pneumonia   Cancer Mother        lung, refused chemo, tolerated radiation   Lung cancer Mother    Hypertension Father    Diabetes Father        type 2   Cancer Father 75       liver   Liver cancer Father    Heart disease Father    Diabetes Maternal Grandmother    Parkinson's disease Maternal Grandmother    Emphysema Maternal Grandfather    COPD Maternal Grandfather    Stroke Paternal Grandfather    Cancer Maternal Uncle        prostate cancer   Colon cancer Neg Hx    Esophageal cancer Neg Hx    Rectal cancer Neg Hx    Stomach cancer Neg Hx    Colon polyps Neg Hx    Social History: Doryan lives in a house built 13 years ago, no water damage, carpet floors, gas and electric heating, window unit ACs, pet dogs indoors, no cockroaches, dust mite protection on the bedding but not pillows, no smoke exposure.  He is retired.  Hobbies exposing him to fumes, chemicals, or dust, no HEPA filter in the home.  [place]   ROS:  All other systems negative except as noted per HPI.  Objective:  Blood pressure (!) 124/56, pulse (!) 49, temperature 97.6 F (36.4 C), temperature source Temporal, resp. rate 17, height 6' 2.25" (1.886 m), weight 224 lb 9.6 oz (101.9 kg), SpO2 99 %. Body mass index is 28.64 kg/m. Physical Exam:  General Appearance:  Alert, cooperative, no distress, appears stated age  Head:  Normocephalic, without obvious abnormality, atraumatic  Eyes:  Conjunctiva clear, EOM's intact  Nose: Nares normal, hypertrophic turbinates, normal mucosa, and no visible anterior polyps  Throat: Lips, tongue normal; teeth and gums normal, normal posterior oropharynx  Neck: Supple, symmetrical  Lungs:   clear to auscultation bilaterally, Respirations unlabored, no coughing   Heart:  regular rate and rhythm and no murmur, Appears well perfused  Extremities: No edema  Skin: Skin color, texture, turgor normal, no rashes or lesions on visualized portions of skin  Neurologic: No gross deficits     Diagnostics: Skin Testing: Environmental allergy panel and select foods.  Adequate controls. Results discussed with patient/family.  Airborne Adult Perc - 06/30/21 1038     Time Antigen Placed 1038    Allergen Manufacturer Lavella Hammock    Location Back    Number of Test 59    Panel 1 Select  1. Control-Buffer 50% Glycerol Negative    2. Control-Histamine 1 mg/ml 3+    3. Albumin saline Negative    4. [place] Negative    5. [place] Negative    6. Johnson Negative    7. [place] Blue Negative    8. Meadow Fescue Negative    9. Perennial Rye Negative    10. Sweet Vernal Negative    11. Timothy Negative    12. Cocklebur Negative    13. Burweed Marshelder Negative    14. Ragweed, short Negative    15. Ragweed, Giant Negative    16. Plantain,  English Negative    17. Lamb's Quarters Negative    18. Sheep Sorrell Negative    19. Rough Pigweed Negative    20. Marsh Elder, Rough Negative    21. Mugwort, Common Negative    22. Ash mix Negative    23. Birch mix Negative    24. Beech American Negative    25. Box, Elder Negative    26. Cedar, red Negative    27. Cottonwood, [place] Negative    28. Elm mix Negative    29. Hickory Negative    30. Maple mix Negative    31. Oak, [place] mix Negative    32. Pecan Pollen Negative    33. Pine mix Negative    34. Sycamore Eastern Negative    35. [place], Black Pollen Negative    36. Alternaria alternata Negative    37. Cladosporium Herbarum Negative    38. Aspergillus mix Negative    39. Penicillium mix Negative    40. Bipolaris sorokiniana (Helminthosporium) Negative    41. Drechslera spicifera (Curvularia) Negative    42. Mucor plumbeus Negative    43. Fusarium moniliforme Negative    44. Aureobasidium pullulans  (pullulara) Negative    45. Rhizopus oryzae Negative    46. Botrytis cinera Negative    47. Epicoccum nigrum Negative    48. Phoma betae Negative    49. Candida Albicans Negative    50. Trichophyton mentagrophytes Negative    51. Mite, D Farinae  5,000 AU/ml Negative    52. Mite, D Pteronyssinus  5,000 AU/ml Negative    53. Cat Hair 10,000 BAU/ml Negative    54.  Dog Epithelia Negative    55. Mixed Feathers Negative    56. Horse Epithelia Negative    57. Cockroach, German Negative    58. Mouse Negative    59. Tobacco Leaf Negative             Food Perc - 06/30/21 1039       Test Information   Time Antigen Placed 7672    Allergen Manufacturer Lavella Hammock    Location Back    Number of allergen test [place]   1. Peanut Negative    2. Soybean food Negative    3. Wheat, whole Negative    4. Sesame Negative    5. Milk, cow Negative    6. Egg White, chicken Negative    7. Casein Negative    8. Shellfish mix Negative    9. Fish mix Negative    10. Cashew Negative             Intradermal - 06/30/21 1138     Time Antigen Placed 1138    Allergen Manufacturer Other    Location Arm    Number of Test 15    Control Negative  [place] Negative    Johnson Negative    7 Grass Negative    Ragweed mix Negative    Weed mix Negative    Tree mix Negative    Mold 1 Negative    Mold 2 Negative    Mold 3 2+    Mold 4 Negative    Cat Negative    Dog Negative    Cockroach Negative    Mite mix Negative             Allergy testing results were read and interpreted by myself, documented by clinical staff.  Assessment and Plan  Chronic Rhinitis - suspect mostly nonallergic: - allergy testing today was negative on skin testing, borderline to minor mold mix 3 (bipolaris, dreschlera, Mucor) on intradermal testing.  All other intradermal's negative. - based on testing, suspect mostly nonallergic cause of your symptoms - allergen avoidance as below -  Continue Astelin (Azelastine) 1-2 sprays in each nostril twice a day as needed.  You may use this as needed for nasal congestion/itchy ears/itchy nose if desired - Consider over the counter antihistamine daily or daily as needed.   -Your options include Zyrtec (Cetirizine) '10mg'$ , Claritin (Loratadine) '10mg'$ , Allegra (Fexofenadine) '180mg'$ , or Xyzal (Levocetirinze) '5mg'$  - referral to ENT to discuss nonallergic rhinitis  Follow-up as needed, sooner if needed.  It was a pleasure meeting you today!  Sigurd Sos, MD [facility]   This note in its entirety was forwarded to the Provider who requested this consultation.  Thank you for your kind referral. I appreciate the opportunity to take part in Demarr's care. Please do not hesitate to contact me with questions.  Sincerely,  Sigurd Sos, MD [facility]

## 2021-06-30 ENCOUNTER — Encounter: Payer: Self-pay | Admitting: Internal Medicine

## 2021-06-30 ENCOUNTER — Ambulatory Visit (INDEPENDENT_AMBULATORY_CARE_PROVIDER_SITE_OTHER): Payer: Medicare Other | Admitting: Internal Medicine

## 2021-06-30 VITALS — BP 124/56 | HR 49 | Temp 97.6°F | Resp 17 | Ht 74.25 in | Wt 224.6 lb

## 2021-06-30 DIAGNOSIS — J310 Chronic rhinitis: Secondary | ICD-10-CM | POA: Diagnosis not present

## 2021-06-30 NOTE — Patient Instructions (Addendum)
Chronic Rhinitis - suspect mostly nonallergic: - allergy testing today was negative on skin testing, borderline to minor mold mix 3 (bipolaris, dreschlera, Mucor) on intradermal testing.  All other intradermal's negative. - based on testing, suspect mostly nonallergic cause of your symptoms - allergen avoidance as below - Continue Astelin (Azelastine) 1-2 sprays in each nostril twice a day as needed.  You may use this as needed for nasal congestion/itchy ears/itchy nose if desired - Consider over the counter antihistamine daily or daily as needed.   -Your options include Zyrtec (Cetirizine) '10mg'$ , Claritin (Loratadine) '10mg'$ , Allegra (Fexofenadine) '180mg'$ , or Xyzal (Levocetirinze) '5mg'$  - referral to ENT to discuss nonallergic rhinitis  Follow-up as needed, sooner if needed.  It was a pleasure meeting you today!  Sigurd Sos, MD [facility]  Control of Mold Allergen   Mold and fungi can grow on a variety of surfaces provided certain temperature and moisture conditions exist.  Outdoor molds grow on plants, decaying vegetation and soil.  The major outdoor mold, Alternaria and Cladosporium, are found in very high numbers during hot and dry conditions.  Generally, a late Summer - Fall peak is seen for common outdoor fungal spores.  Rain will temporarily lower outdoor mold spore count, but counts rise rapidly when the rainy period ends.  The most important indoor molds are Aspergillus and Penicillium.  Dark, humid and poorly ventilated basements are ideal sites for mold growth.  The next most common sites of mold growth are the bathroom and the kitchen.  Outdoor (Seasonal) Mold Control  Use air conditioning and keep windows closed Avoid exposure to decaying vegetation. Avoid leaf raking. Avoid grain handling. Consider wearing a face mask if working in moldy areas.    Indoor (Perennial) Mold Control   Maintain humidity below 50%. Clean washable surfaces with 5% bleach  solution. Remove sources e.g. contaminated carpets.

## 2021-07-11 ENCOUNTER — Telehealth: Payer: Self-pay

## 2021-07-11 NOTE — Telephone Encounter (Signed)
-----   Message from Clemon Chambers, MD sent at 06/30/2021  1:23 PM EDT ----- Can we place referral for ENT?  For nonallergic rhinitis and sinus congestion

## 2021-08-16 ENCOUNTER — Other Ambulatory Visit: Payer: Self-pay | Admitting: Family Medicine

## 2021-08-18 ENCOUNTER — Telehealth: Payer: Self-pay | Admitting: Family Medicine

## 2021-08-18 NOTE — Telephone Encounter (Signed)
Called them back to let know we did received fax for and signed / faxed back

## 2021-08-18 NOTE — Telephone Encounter (Signed)
Christina calling from [facility] in [place] is calling to follow up on [place] that is needed for insurance for reimbursement on hearing aids. Please call her [phone] to confirm receipt. Fax number to [phone] (insurance coord).

## 2021-10-28 ENCOUNTER — Telehealth (INDEPENDENT_AMBULATORY_CARE_PROVIDER_SITE_OTHER): Payer: Medicare Other | Admitting: Cardiology

## 2021-10-28 ENCOUNTER — Encounter (HOSPITAL_BASED_OUTPATIENT_CLINIC_OR_DEPARTMENT_OTHER): Payer: Self-pay | Admitting: Cardiology

## 2021-10-28 VITALS — BP 146/77 | HR 53 | Ht 75.0 in | Wt 220.0 lb

## 2021-10-28 DIAGNOSIS — I700 Atherosclerosis of aorta: Secondary | ICD-10-CM

## 2021-10-28 DIAGNOSIS — E7800 Pure hypercholesterolemia, unspecified: Secondary | ICD-10-CM | POA: Diagnosis not present

## 2021-10-28 DIAGNOSIS — I2510 Atherosclerotic heart disease of native coronary artery without angina pectoris: Secondary | ICD-10-CM

## 2021-10-28 DIAGNOSIS — I10 Essential (primary) hypertension: Secondary | ICD-10-CM

## 2021-10-28 NOTE — Progress Notes (Signed)
Virtual Visit via Video Note   Because of Zachary Rowe's co-morbid illnesses, he is at least at moderate risk for complications without adequate follow up.  This format is felt to be most appropriate for this patient at this time.  All issues noted in this document were discussed and addressed.  A limited physical exam was performed with this format.  Please refer to the patient's chart for his consent to telehealth for [facility].  The patient was identified using 2 identifiers.  Date:  10/28/2021   ID:  Zachary Rowe, DOB [birth year], MRN [id number]  Patient Location: Home Provider Location: Office/Clinic  PCP:  Mosie Lukes, MD  Cardiologist:  Buford Dresser, MD  Electrophysiologist:  None   Evaluation Performed:  Follow-Up Visit  History of Present Illness:    [name] is a 66 y.o. male with a hx of hypertension, hyperlipidemia who is seen in follow up for management of hypertension, hyperlipidemia, and aortic atherosclerosis.   CV history: prior CT chest showed calcium in aortic arch, mild calcium left coronary system. Has hypertension, hyperlipidemia, former smoker.  Today: Recently started running, doing well. In a class, running over a mile at a time. Ran a 5k. No symptoms. LDL at goal. BP running high today but has typically well controlled.  Tolerating medications well, no issues. Reviewed most recent labs.  Denies chest pain, shortness of breath at rest or with normal exertion. No PND, orthopnea, LE edema or unexpected weight gain. No syncope or palpitations.   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   Arthritis    Back pain 04/02/2014   Bilateral carotid bruits 11/07/2016   Bradycardia 08/06/2016   Chicken pox as a child   Chronic sinusitis 01-03-2000   Constipation 08/06/2016   Depression with anxiety 05/07/2016   Elevated BP 09/02/2010   Family history of liver cancer    Family history of lung cancer     Family history of prostate cancer    Fatigue 11/01/2010   Hard of hearing    wears bilateral hearing aids   History of chicken pox 09/02/2010   History of measles 09/02/2010   HTN (hypertension) 09/02/2010   Hyperglycemia 08/04/2016   Hyperlipidemia    Hypertension    Measles as a child   Multiple allergies 09/02/2010   Overweight(278.02) 09/02/2010   Personal history of colonic polyps - adenoma 04/17/2007   04/2007 - 3 sigmoid polyps - at least 1 tubular adenoma   Peyronie disease 10/14/2012   Preventative health care 11/17/2012   Raynaud disease 04/11/2011   Thyroid disease     Past Surgical History:  Procedure Laterality Date   COLONOSCOPY  03/06/2013   x2 repeated 2020   SKIN BIOPSY     BB in forehead    Current Medications: Current Outpatient Medications on File Prior to Visit  Medication Sig   amLODipine (NORVASC) 5 MG tablet TAKE 1 TABLET BY MOUTH DAILY   atorvastatin (LIPITOR) 20 MG tablet Take 1 tablet (20 mg total) by mouth daily.   Azelastine HCl 0.15 % SOLN USE 2 SPRAY(S) ONCE DAILY AS NEEDED   folic acid (FOLVITE) 144 MCG tablet Take 400 mcg by mouth daily.   levothyroxine (SYNTHROID) 88 MCG tablet TAKE 1 TABLET BY MOUTH DAILY  BEFORE BREAKFAST   lisinopril-hydrochlorothiazide (ZESTORETIC) 20-25 MG tablet TAKE 1 TABLET BY MOUTH DAILY   MELATONIN PO Take 1 tablet by mouth at bedtime.   omeprazole (PRILOSEC)  20 MG capsule Take 20 mg by mouth daily.   thiamine 250 MG tablet Take 250 mg by mouth daily.   No current facility-administered medications on file prior to visit.     Allergies:   Patient has no known allergies.   Social History   Tobacco Use   Smoking status: Former    Types: Cigarettes    Quit date: 01/03/2007    Years since quitting: 14.8   Smokeless tobacco: Former    Types: Chew    Quit date: 2016   Tobacco comments:    dip  Vaping Use   Vaping Use: Never used  Substance Use Topics   Alcohol use: Yes    Alcohol/week: 15.0 - 20.0 standard drinks  of alcohol    Types: 15 - 20 Glasses of wine per week   Drug use: No    Family History: family history includes COPD in his maternal grandfather and mother; Cancer in his maternal uncle and mother; Cancer (age of onset: 24) in his father; Diabetes in his father, maternal grandmother, and mother; Emphysema in his maternal grandfather; Heart disease in his father; Hypertension in his father and mother; Liver cancer in his father; Lung cancer in his mother; Macular degeneration in his mother; Neuropathy in his mother; Parkinson's disease in his maternal grandmother; Stroke in his paternal grandfather. There is no history of Colon cancer, Esophageal cancer, Rectal cancer, Stomach cancer, or Colon polyps.  ROS:   Please see the history of present illness.   Additional pertinent ROS otherwise unremarkable.   EKGs/Labs/Other Studies Reviewed:    The following studies were reviewed today:  CT chest 10/08/18 IMPRESSION: 1. Lung-RADS 2S, benign appearance or behavior. Continue annual screening with low-dose chest CT without contrast in 12 months. 2. The "S" modifier above refers to potentially clinically significant non lung cancer related findings. Specifically, there is aortic atherosclerosis, in addition to left anterior descending coronary artery disease. Please note that although the presence of coronary artery calcium documents the presence of coronary artery disease, the severity of this disease and any potential stenosis cannot be assessed on this non-gated CT examination. Assessment for potential risk factor modification, dietary therapy or pharmacologic therapy may be warranted, if clinically indicated. 3. Mild diffuse bronchial wall thickening with mild centrilobular and paraseptal emphysema; imaging findings suggestive of underlying COPD. 4. Hepatic steatosis. Aortic Atherosclerosis (ICD10-I70.0) and Emphysema (ICD10-J43.9).  US carotid 11/09/2016 FINDINGS: Criteria:  Quantification of carotid stenosis is based on velocity parameters that correlate the residual internal carotid diameter with NASCET-based stenosis levels, using the diameter of the distal internal carotid lumen as the denominator for stenosis measurement. The following velocity measurements were obtained: RIGHT ICA:  64 cm/sec CCA:  2 4 cm/sec SYSTOLIC ICA/CCA RATIO:  0.5 DIASTOLIC ICA/CCA RATIO:  0.8 ECA:  68 cm/sec LEFT ICA:  85 cm/sec CCA:  884 cm/sec SYSTOLIC ICA/CCA RATIO:  0.7 DIASTOLIC ICA/CCA RATIO:  1.0 ECA:  105 cm/sec RIGHT CAROTID ARTERY: Minimal plaque in the bulb. Low resistance internal carotid Doppler pattern. Bradycardia. RIGHT VERTEBRAL ARTERY:  Antegrade. LEFT CAROTID ARTERY: Minimal calcified plaque in the bulb. Low resistance internal carotid Doppler pattern. LEFT VERTEBRAL ARTERY:  Antegrade. IMPRESSION: Less than 50% stenosis in the right and left internal carotid artery.  Monitor 09/05/2016 7 Day Event Monitor Quality: Fair.  Baseline artifact. Predominant rhythm: sinus bradycardia Average heart rate: 51 bpm Max heart rate: 113 bpm Min heart rate: 38 bpm   Patient reported dizziness at which times sinus rhythm and sinus  bradycardia were noted.   During the episode of dizziness that was associated with bradycardia, his heart rate was 57 bpm.    ETT 06/22/16 Blood pressure demonstrated a normal response to exercise. There was no ST segment deviation noted during stress. Normal exercise stress test with no EKG criteria of ischemia. The patient exercised for 11.8 mets achieving 86% MPHR. He has no symtpoms. This is a low risk study.  EKG:  EKG is personally reviewed.   10/13/2020: sinus bradycardia at 46 bpm with low anterior forces 10/15/2019: sinus bradycardia at 48 bpm  Recent Labs: No results found for requested labs within last 365 days.   Recent Lipid Panel    Component Value Date/Time   CHOL 166 01/24/2021 1320   TRIG 105 01/24/2021 1320    HDL 77 01/24/2021 1320   CHOLHDL 2.2 01/24/2021 1320   CHOLHDL 3 07/19/2020 1452   VLDL 25.2 07/19/2020 1452   LDLCALC 70 01/24/2021 1320   LDLDIRECT 91.0 09/26/2018 1054    Physical Exam:    VS:  BP (!) 146/77   Pulse (!) 53   Ht '6\' 3"'$  (1.905 m)   Wt 220 lb (99.8 kg)   BMI 27.50 kg/m     Wt Readings from Last 3 Encounters:  10/28/21 220 lb (99.8 kg)  06/30/21 224 lb 9.6 oz (101.9 kg)  05/04/21 220 lb (99.8 kg)    VITAL SIGNS:  reviewed GEN:  no acute distress EYES:  sclerae anicteric, EOMI - Extraocular Movements Intact RESPIRATORY:  normal respiratory effort, symmetric expansion CARDIOVASCULAR:  no visible JVD SKIN:  no rash, lesions or ulcers. MUSCULOSKELETAL:  no obvious deformities. NEURO:  alert and oriented x 3, no obvious focal deficit PSYCH:  normal affect   ASSESSMENT:    1. Aortic atherosclerosis ([place])   2. Coronary artery calcification seen on CT scan   3. Pure hypercholesterolemia   4. Essential hypertension      PLAN:    Aortic atherosclerosis Coronary calcium on CT Hypercholesterolemia -last LDL per KPN 70, at goal -continue atorvastatin 20 mg daily -have discussed aspirin in the past  Hypertension: BP elevated today but has been at goal at other visits, most recently in June. Have had to reduced BP meds in the past so will not overtreat based on one number, but if remains elevated then would titrate meds further -continue lower dose of amlodipine, had to be reduced from 10 mg -continue HCTZ-lisinopril  Cardiac risk counseling and prevention recommendations: -recommend heart healthy/Mediterranean diet, with whole grains, fruits, vegetable, fish, lean meats, nuts, and olive oil. Limit salt. -recommend moderate walking, 3-5 times/week for 30-50 minutes each session. Aim for at least 150 minutes.week. Goal should be pace of 3 miles/hours, or walking 1.5 miles in 30 minutes -recommend avoidance of tobacco products. Avoid excess alcohol.  Plan  for follow up: 1 year or sooner as needed  Today, I have spent 12 minutes with the patient with telehealth technology discussing the above problems.  Additional time spent in chart review, documentation, and communication.   Buford Dresser, MD, PhD [place]  [facility]    Medication Adjustments/Labs and Tests Ordered: Current medicines are reviewed at length with the patient today.  Concerns regarding medicines are outlined above.   No orders of the defined types were placed in this encounter.  No orders of the defined types were placed in this encounter.  Patient Instructions  Medication Instructions:  Your Physician recommend you continue on your current medication as directed.    *  If you need a refill on your cardiac medications before your next appointment, please call your pharmacy*   Lab Work: None ordered today   Testing/Procedures: None ordered today   Follow-Up: At [facility], you and your health needs are our priority.  As part of our continuing mission to provide you with exceptional heart care, we have created designated Provider Care Teams.  These Care Teams include your primary Cardiologist (physician) and Advanced Practice Providers (APPs -  Physician Assistants and Nurse Practitioners) who all work together to provide you with the care you need, when you need it.  We recommend signing up for the patient portal called "MyChart".  Sign up information is provided on this After Visit Summary.  MyChart is used to connect with patients for Virtual Visits (Telemedicine).  Patients are able to view lab/test results, encounter notes, upcoming appointments, etc.  Non-urgent messages can be sent to your provider as well.   To learn more about what you can do with MyChart, go to [email or web address].    Your next appointment:   1 year(s)  The format for your next appointment:   In Person  Provider:   Buford Dresser, MD         Signed, Buford Dresser, MD PhD 10/28/2021     [place]

## 2021-10-28 NOTE — Patient Instructions (Signed)
Medication Instructions:  Your Physician recommend you continue on your current medication as directed.    *If you need a refill on your cardiac medications before your next appointment, please call your pharmacy*   Lab Work: None ordered today   Testing/Procedures: None ordered today   Follow-Up: At [facility], you and your health needs are our priority.  As part of our continuing mission to provide you with exceptional heart care, we have created designated Provider Care Teams.  These Care Teams include your primary Cardiologist (physician) and Advanced Practice Providers (APPs -  Physician Assistants and Nurse Practitioners) who all work together to provide you with the care you need, when you need it.  We recommend signing up for the patient portal called "MyChart".  Sign up information is provided on this After Visit Summary.  MyChart is used to connect with patients for Virtual Visits (Telemedicine).  Patients are able to view lab/test results, encounter notes, upcoming appointments, etc.  Non-urgent messages can be sent to your provider as well.   To learn more about what you can do with MyChart, go to [email or web address].    Your next appointment:   1 year(s)  The format for your next appointment:   In Person  Provider:   Bridgette Christopher, MD          

## 2021-11-06 IMAGING — CR DG LUMBAR SPINE 2-3V
3 series · 3 of 3 positions shown · non-contrast
Comparison: CT abdomen dated 10/02/2014.

CLINICAL DATA: 63-year-old male with bilateral lower extremity
weakness. No known injury.

EXAM:
LUMBAR SPINE - 2-3 VIEW

[w l-spine a.p. *]
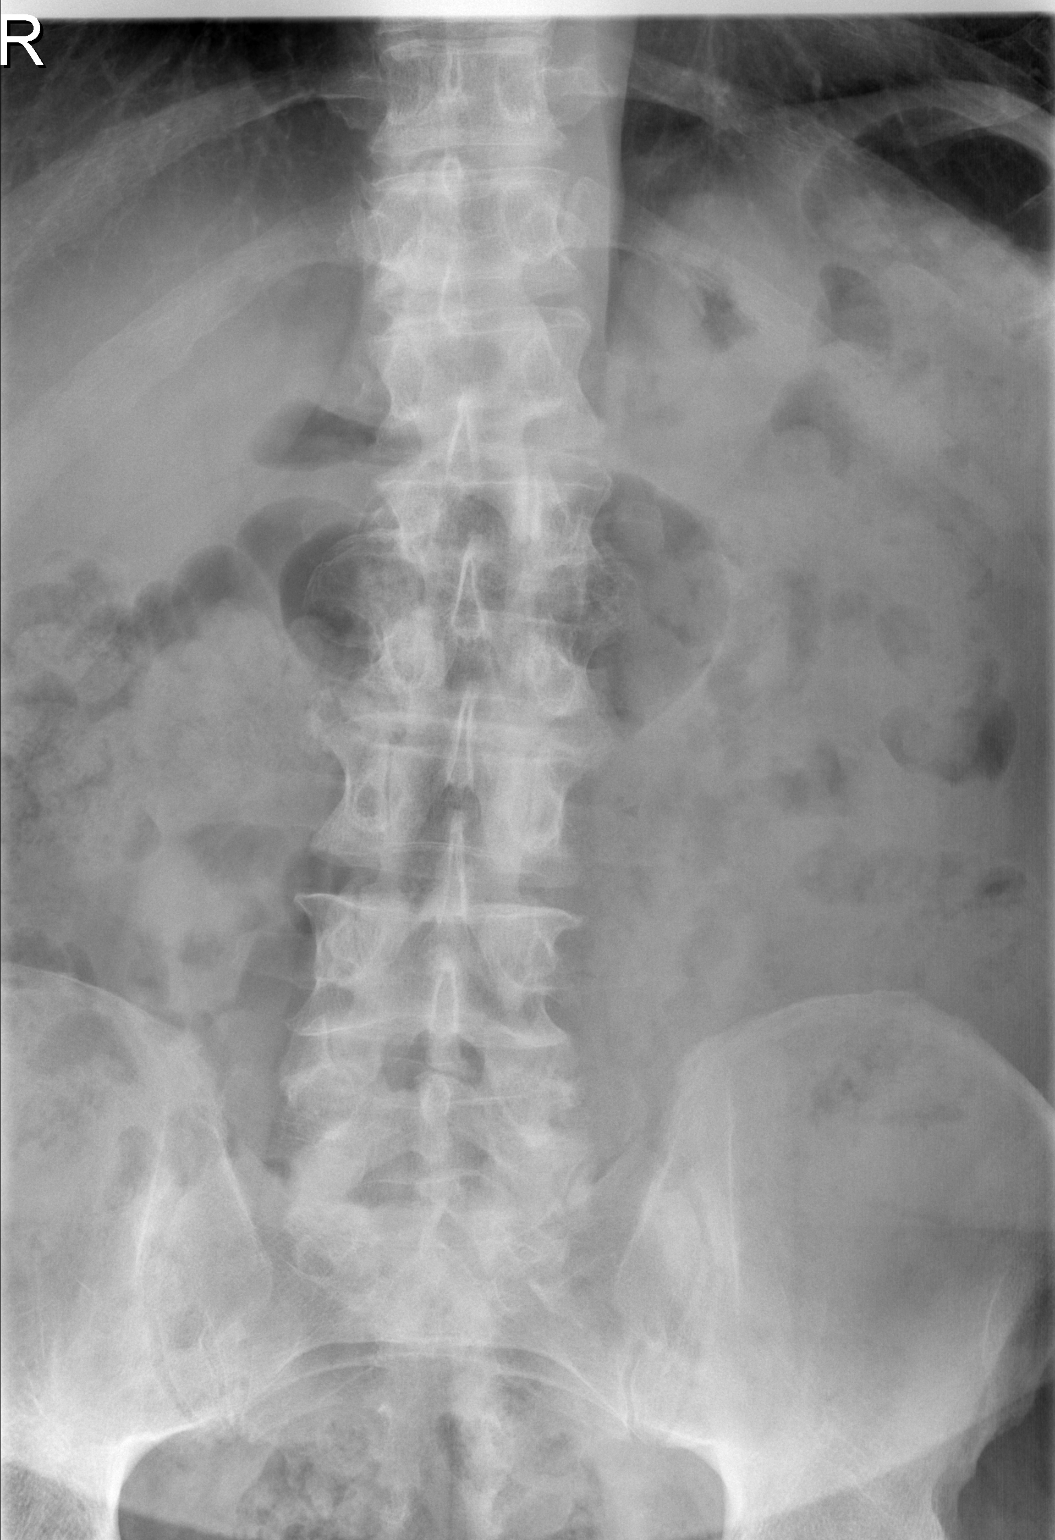

[w l-spine lat (1 of 2)]
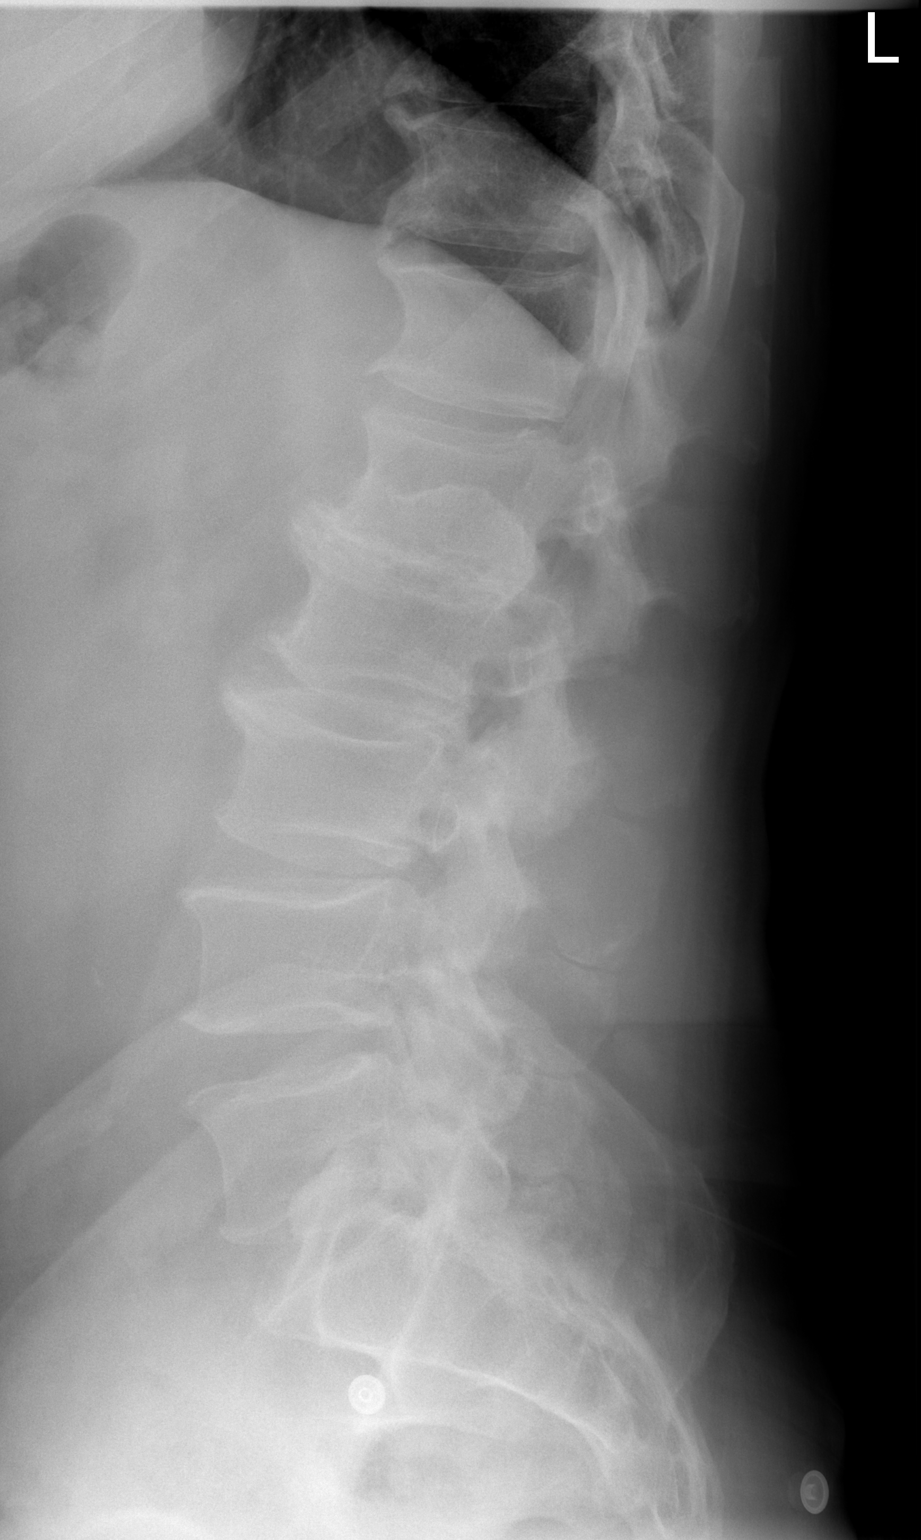

[w l-spine lat (2 of 2)]
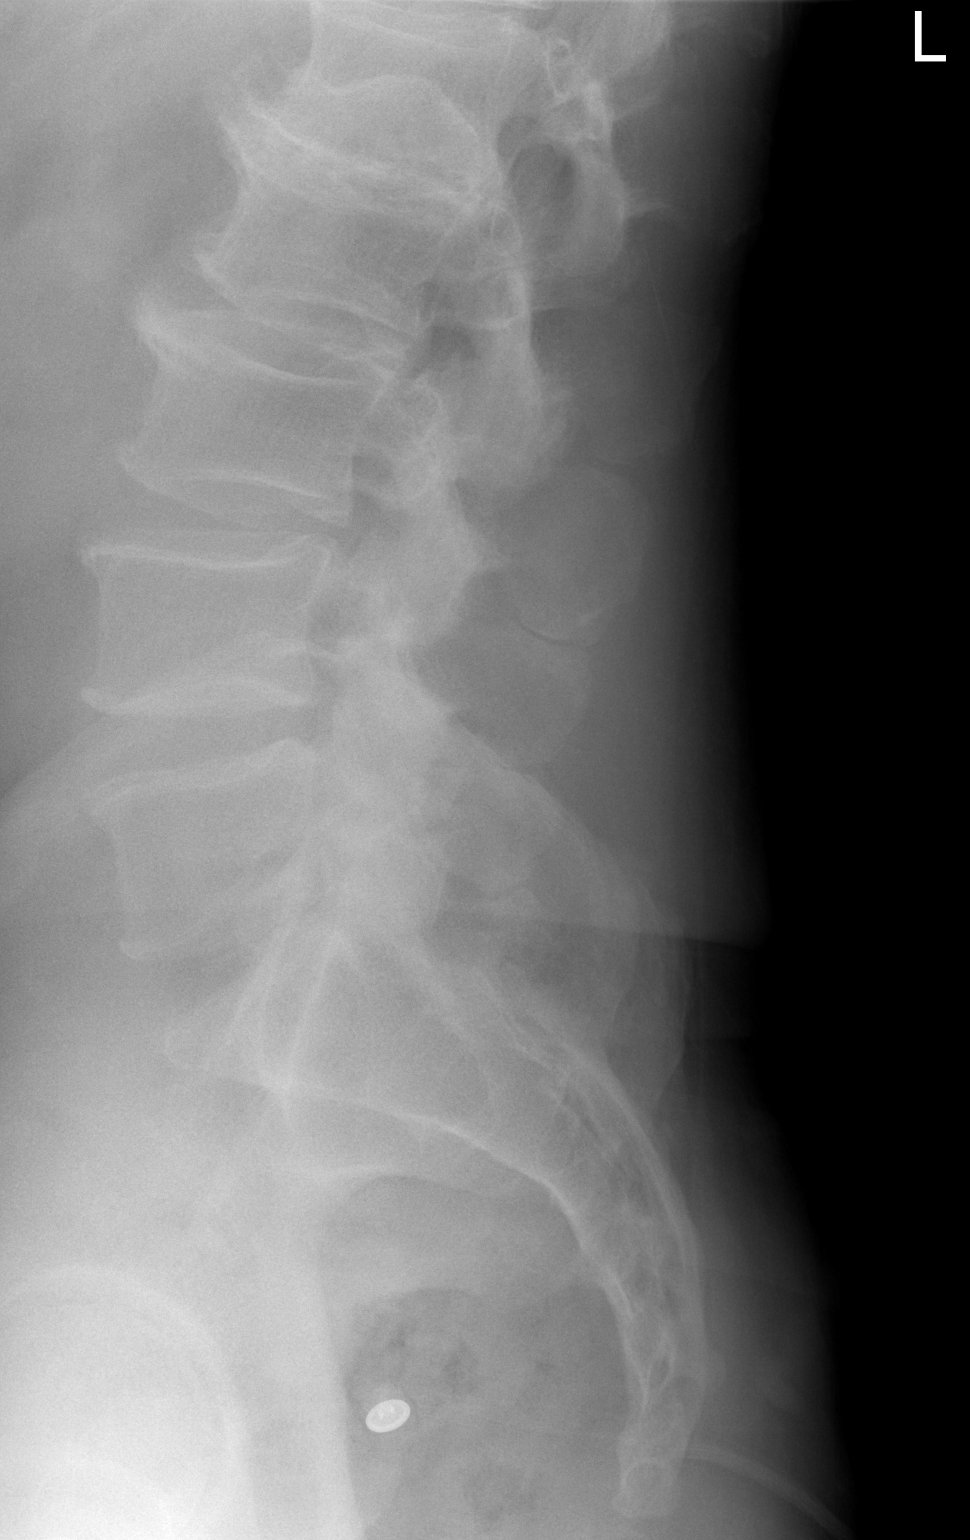

[3 of 3 positions shown; findings below may reference images not displayed]

FINDINGS: Six lumbar type appearing vertebra. There is no acute fracture or
subluxation of the lumbar spine. Multilevel degenerative changes
with disc space narrowing and osteophyte primarily at L2-L3. the
visualized posterior elements appear intact. Multilevel facet
arthropathy noted. The soft tissues are unremarkable.
IMPRESSION: 1. No acute fracture or dislocation.
2. Multilevel degenerative changes primarily at L2-L3.

## 2022-03-10 ENCOUNTER — Other Ambulatory Visit (HOSPITAL_BASED_OUTPATIENT_CLINIC_OR_DEPARTMENT_OTHER): Payer: Self-pay | Admitting: Cardiology

## 2022-03-10 DIAGNOSIS — E7800 Pure hypercholesterolemia, unspecified: Secondary | ICD-10-CM

## 2022-04-17 ENCOUNTER — Encounter: Payer: Self-pay | Admitting: *Deleted

## 2022-04-26 ENCOUNTER — Telehealth: Payer: Self-pay | Admitting: Family Medicine

## 2022-04-26 NOTE — Telephone Encounter (Signed)
Copied from CRM [id number]. Topic: Medicare AWV >> Apr 26, 2022 10:46 AM Payton Doughty wrote: Reason for CRM: Called patient to schedule Medicare Annual Wellness Visit (AWV). Left message for patient to call back and schedule Medicare Annual Wellness Visit (AWV).  Last date of AWV: 05/04/21  Please schedule an appointment at any time with Zachary Rowe, CMA  .  If any questions, please contact me.  Thank you ,  Zachary Rowe; Care Guide Ambulatory Clinical Support [place] [facility] Direct Dial: [phone]

## 2022-05-20 ENCOUNTER — Encounter: Payer: Self-pay | Admitting: Family Medicine

## 2022-05-22 ENCOUNTER — Other Ambulatory Visit: Payer: Self-pay | Admitting: Family Medicine

## 2022-05-22 DIAGNOSIS — R3911 Hesitancy of micturition: Secondary | ICD-10-CM

## 2022-06-01 ENCOUNTER — Ambulatory Visit (INDEPENDENT_AMBULATORY_CARE_PROVIDER_SITE_OTHER): Payer: Medicare Other | Admitting: Urology

## 2022-06-01 ENCOUNTER — Encounter: Payer: Self-pay | Admitting: Urology

## 2022-06-01 VITALS — BP 143/78 | HR 58 | Wt 210.0 lb

## 2022-06-01 DIAGNOSIS — N401 Enlarged prostate with lower urinary tract symptoms: Secondary | ICD-10-CM | POA: Diagnosis not present

## 2022-06-01 LAB — URINALYSIS, ROUTINE W REFLEX MICROSCOPIC
Bilirubin, UA: NEGATIVE
Glucose, UA: NEGATIVE
Ketones, UA: NEGATIVE
Leukocytes,UA: NEGATIVE
Nitrite, UA: NEGATIVE
Protein,UA: NEGATIVE
RBC, UA: NEGATIVE
Specific Gravity, UA: 1.015 (ref 1.005–1.030)
Urobilinogen, Ur: 0.2 mg/dL (ref 0.2–1.0)
pH, UA: 6 (ref 5.0–7.5)

## 2022-06-01 LAB — BLADDER SCAN AMB NON-IMAGING

## 2022-06-01 MED ORDER — TAMSULOSIN HCL 0.4 MG PO CAPS: 0.4000 mg | ORAL_CAPSULE | Freq: Every day | ORAL | 3 refills | Status: DC

## 2022-06-01 NOTE — Progress Notes (Signed)
Assessment: 1. BPH with obstruction/lower urinary tract symptoms      Plan: Today I had a long discussion with the patient regarding his lower urinary tract symptoms including the natural history and standard treatment options.  Patient elects to begin new medical therapy. Rx: Tamsulosin 0.4 mg daily Nature of medication including proper utilization as well as potential adverse events and side effects reviewed. Patient will return in 3 months with PSA prior to visit for symptom assessment.  Chief Complaint:   History of Present Illness:  Zachary Rowe is a 67 y.o. male who is seen in consultation from Bradd Canary, MD for evaluation of lower urinary tract symptoms. Patient has a past medical history of hypertension, hyperlipidemia, and CKD with creatinine 1.6-1.8/GFR in the 40s.  Patient reports progressive lower urinary tract symptoms over the last few years. 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   Arthritis    Back pain 04/02/2014   Bilateral carotid bruits 11/07/2016   Bradycardia 08/06/2016   Chicken pox as a child   Chronic sinusitis 01-03-2000   Constipation 08/06/2016   Depression with anxiety 05/07/2016   Elevated BP 09/02/2010   Family history of liver cancer    Family history of lung cancer    Family history of prostate cancer    Fatigue 11/01/2010   Hard of hearing    wears bilateral hearing aids   History of chicken pox 09/02/2010   History of measles 09/02/2010   HTN (hypertension) 09/02/2010   Hyperglycemia 08/04/2016   Hyperlipidemia    Hypertension    Measles as a child   Multiple allergies 09/02/2010   Overweight(278.02) 09/02/2010   Personal history of colonic polyps - adenoma  04/17/2007   04/2007 - 3 sigmoid polyps - at least 1 tubular adenoma   Peyronie disease 10/14/2012   Preventative health care 11/17/2012   Raynaud disease 04/11/2011   Thyroid disease     Past Surgical History:  Past Surgical History:  Procedure Laterality Date   COLONOSCOPY  03/06/2013   x2 repeated 2020   SKIN BIOPSY     BB in forehead    Allergies:  No Known Allergies  Family History:  Family History  Problem Relation Age of Onset   Hypertension Mother    Diabetes Mother        borderline   Neuropathy Mother    Macular degeneration Mother    COPD Mother        recent pneumonia   Cancer Mother        lung, refused chemo, tolerated radiation   Lung cancer Mother    Hypertension Father    Diabetes Father        type 2   Cancer Father 72       liver   Liver cancer Father    Heart disease Father    Diabetes Maternal Grandmother    Parkinson's disease Maternal Grandmother    Emphysema Maternal Grandfather    COPD Maternal Grandfather    Stroke Paternal Grandfather    Cancer Maternal Uncle        prostate cancer   Colon cancer Neg Hx    Esophageal cancer Neg Hx  Rectal cancer Neg Hx    Stomach cancer Neg Hx    Colon polyps Neg Hx     Social History:  Social History   Tobacco Use   Smoking status: Former    Types: Cigarettes    Quit date: 01/03/2007    Years since quitting: 15.4   Smokeless tobacco: Former    Types: Chew    Quit date: 2016   Tobacco comments:    dip  Vaping Use   Vaping Use: Never used  Substance Use Topics   Alcohol use: Yes    Alcohol/week: 15.0 - 20.0 standard drinks of alcohol    Types: 15 - 20 Glasses of wine per week   Drug use: No    Review of symptoms:  Constitutional:  Negative for unexplained weight loss, night sweats, fever, chills ENT:  Negative for nose bleeds, sinus pain, painful swallowing CV:  Negative for chest pain, shortness of breath, exercise intolerance, palpitations, loss of consciousness Resp:  Negative  for cough, wheezing, shortness of breath GI:  Negative for nausea, vomiting, diarrhea, bloody stools GU:  Positives noted in HPI; otherwise negative for gross hematuria, dysuria, urinary incontinence Neuro:  Negative for seizures, poor balance, limb weakness, slurred speech Psych:  Negative for lack of energy, depression, anxiety Endocrine:  Negative for polydipsia, polyuria, symptoms of hypoglycemia (dizziness, hunger, sweating) Hematologic:  Negative for anemia, purpura, petechia, prolonged or excessive bleeding, use of anticoagulants  Allergic:  Negative for difficulty breathing or choking as a result of exposure to anything; no shellfish allergy; no allergic response (rash/itch) to materials, foods  Physical exam: BP (!) 143/78   Pulse (!) 58   Wt 210 lb (95.3 kg)   BMI 26.25 kg/m  GENERAL APPEARANCE:  Well appearing, well developed, well nourished, NAD  GU: Normal external genitalia DRE: Normal sphincter tone; prostate is approximately 40 g without evidence of nodules or induration.  Results: Results for orders placed or performed in visit on 06/01/22 (from the past 24 hour(s))  Bladder Scan (Post Void Residual) in office   Collection Time: 06/01/22  1:14 PM  Result Value Ref Range   Scan Result 1ml

## 2022-06-12 ENCOUNTER — Other Ambulatory Visit (INDEPENDENT_AMBULATORY_CARE_PROVIDER_SITE_OTHER): Payer: Medicare Other

## 2022-06-12 ENCOUNTER — Encounter (HOSPITAL_BASED_OUTPATIENT_CLINIC_OR_DEPARTMENT_OTHER): Payer: Self-pay

## 2022-06-12 DIAGNOSIS — R55 Syncope and collapse: Secondary | ICD-10-CM

## 2022-06-12 NOTE — Telephone Encounter (Signed)
Blood pressure shows some rather low readings. Ensure eating regular meals, staying well hydrated. Hold Amlodipine. Continue Lisinopril-HCTZ 20-25.   Recommend live ZIO monitor. F/u office visit in 4-6 weeks. Can determine need for echo or carotid duplex at that time.   Alver Sorrow, NP

## 2022-06-12 NOTE — Telephone Encounter (Signed)
Okay to order live monitor for patient and have him follow up in 6-8 weeks?

## 2022-06-12 NOTE — Progress Notes (Unsigned)
Enrolled for [employer] to mail a ZIO AT Live Telemetry monitor to patients address on file.   Dr. Bridgette Christopher to read. 

## 2022-06-15 DIAGNOSIS — R55 Syncope and collapse: Secondary | ICD-10-CM | POA: Diagnosis not present

## 2022-07-04 ENCOUNTER — Encounter (HOSPITAL_BASED_OUTPATIENT_CLINIC_OR_DEPARTMENT_OTHER): Payer: Self-pay

## 2022-07-28 ENCOUNTER — Telehealth: Payer: Self-pay | Admitting: Family Medicine

## 2022-07-28 NOTE — Telephone Encounter (Signed)
Copied from CRM [id number]. Topic: Medicare AWV >> Jul 28, 2022 11:01 AM Payton Doughty wrote: Reason for CRM: LM 07/28/2022 to schedule AWV   Verlee Rossetti; Care Guide Ambulatory Clinical Support [place] [facility] Direct Dial: [phone]

## 2022-08-02 ENCOUNTER — Ambulatory Visit (HOSPITAL_BASED_OUTPATIENT_CLINIC_OR_DEPARTMENT_OTHER): Payer: Medicare Other | Admitting: Cardiology

## 2022-08-02 ENCOUNTER — Encounter (HOSPITAL_BASED_OUTPATIENT_CLINIC_OR_DEPARTMENT_OTHER): Payer: Self-pay | Admitting: Cardiology

## 2022-08-02 VITALS — BP 137/78 | HR 43 | Ht 75.0 in | Wt 214.0 lb

## 2022-08-02 DIAGNOSIS — Z712 Person consulting for explanation of examination or test findings: Secondary | ICD-10-CM | POA: Diagnosis not present

## 2022-08-02 DIAGNOSIS — I10 Essential (primary) hypertension: Secondary | ICD-10-CM

## 2022-08-02 DIAGNOSIS — I700 Atherosclerosis of aorta: Secondary | ICD-10-CM

## 2022-08-02 DIAGNOSIS — R55 Syncope and collapse: Secondary | ICD-10-CM

## 2022-08-02 DIAGNOSIS — E7800 Pure hypercholesterolemia, unspecified: Secondary | ICD-10-CM

## 2022-08-02 DIAGNOSIS — I2510 Atherosclerotic heart disease of native coronary artery without angina pectoris: Secondary | ICD-10-CM

## 2022-08-02 DIAGNOSIS — I951 Orthostatic hypotension: Secondary | ICD-10-CM

## 2022-08-02 MED ORDER — LISINOPRIL 20 MG PO TABS: 20.0000 mg | ORAL_TABLET | Freq: Every day | ORAL | 11 refills | Status: DC

## 2022-08-02 NOTE — Progress Notes (Signed)
Cardiology Office Note:  .    Date:  08/02/2022  ID:  Zachary Rowe, DOB May 23, 1955, MRN [id number] PCP: Zachary Canary, MD  [facility] Providers Cardiologist:  Zachary Red, MD     History of Present Illness: .    Zachary Rowe is a 67 y.o. male with a hx of aortic atherosclerosis, hypertension, hyperlipidemia who is seen in follow up.    CV history: prior CT chest showed calcium in aortic arch, mild calcium left coronary system. Has hypertension, hyperlipidemia, former smoker.   Seen by video visit 10/2021 and was doing well. Running for exercise in a class. Ran a 5k. No symptoms. LDL at goal. BP running high at that visit but typically well controlled. Tolerated medications well, no issues.  He wore a monitor 06/2022 revealing predominantly sinus rhythm. First Degree AV Block was present. 4 Supraventricular Tachycardia runs occurred. No VT, atrial fibrillation, high degree block, or pauses noted. Isolated atrial and ventricular ectopy was rare (<1%). There were 24 triggered events, which were sinus. After the monitor was completed, he reported an episode of dizziness on 07/03/22 in the evening after his work out class. At onset he softly fell to the ground without injury; his dizziness lasted about 5 minutes before subsiding.  Today, he reports feeling presyncopal with dizziness a couple times. Typically he would sit down and wait for his symptoms to subside; seems to randomly occur. We also reviewed his more severe episode on 7/1: he believes that he stressed his muscles too much and lost too much oxygen. During his exercise class he stood up off the bike and felt the fuzziness in his head. He caught himself on the bike but still went down to the floor.   Additionally, he describes having other episodes of lightheadedness every once in a great while. He notes that whenever he recorded events while wearing the monitor, the episodes lasted less than 5 minutes. Usually he eats  and drinks well throughout the day. He is consciously trying to stay more hydrated.  His regimen includes lisinopril-HCTZ, atorvastatin, and melatonin in the evenings. In the mornings he takes his levothyroxine, folic acid, B1, and iron. He confirms that he is no longer taking amlodipine at this time.  Continues to exercise 3 times per week, sometimes a fairly aggressive workout.  He denies any palpitations, chest pain, shortness of breath, peripheral edema, headaches, orthopnea, or PND.  ROS:  Please see the history of present illness. ROS otherwise negative except as noted.  (+) Presyncope (+) Dizziness/Lightheadedness  Studies Reviewed: Marland Kitchen         Monitor 06/2022: Patch Wear Time:  12 days and 19 hours (2024-06-13T11:40:46-0400 to 2024-06-26T07:16:03-398)   Patient had a min HR of 37 bpm, max HR of 152 bpm, and avg HR of 57 bpm. Predominant underlying rhythm was Sinus Rhythm. First Degree AV Block was present. 4 Supraventricular Tachycardia runs occurred, the run with the fastest interval lasting 5 beats with a max rate of 152 bpm, the longest lasting 8 beats with an avg rate of 90 bpm. No VT, atrial fibrillation, high degree block, or pauses noted. Isolated atrial and ventricular ectopy was rare (<1%). There were 24 triggered events, which were sinus. No high risk arrhythmias detected.   Physical Exam:    VS:  BP 137/78   Pulse (!) 43   Ht 6\' 3"  (1.905 m)   Wt 214 lb (97.1 kg)   BMI 26.75 kg/m    Wt Readings from  Last 3 Encounters:  08/02/22 214 lb (97.1 kg)  06/01/22 210 lb (95.3 kg)  10/28/21 220 lb (99.8 kg)    Orthostatics reviewed, positive Lying 168/87, HR 41 Sitting 130/79, HR 49 Standing 107/69, HR 54 Standing 3 min 99/65, HR 67  GEN: Well nourished, well developed in no acute distress HEENT: Normal, moist mucous membranes NECK: No JVD CARDIAC: regular rhythm, normal S1 and S2, no rubs or gallops. No murmur. VASCULAR: Radial and DP pulses 2+ bilaterally. No  carotid bruits RESPIRATORY:  Clear to auscultation without rales, wheezing or rhonchi  ABDOMEN: Soft, non-tender, non-distended MUSCULOSKELETAL:  Ambulates independently SKIN: Warm and dry, no edema NEUROLOGIC:  Alert and oriented x 3. No focal neuro deficits noted. PSYCHIATRIC:  Normal affect   ASSESSMENT AND PLAN: .    Syncope and collapse History of hypertension -orthostatic on exam today -no longer on amlodipine, instructed not to restart -will change from lisinopril-hydrochlorothiazide to lisinopril alone today (stop hydrochlorothiazide) -discussed hydration, compression -discussed monitoring home BP numbers -reviewed Rowe flag warning signs that need immediate medical attention -close follow up  Aortic atherosclerosis Coronary calcium on CT Hypercholesterolemia -continue atorvastatin 20 mg daily -have discussed aspirin in the past   Cardiac risk counseling and prevention recommendations: -recommend heart healthy/Mediterranean diet, with whole grains, fruits, vegetable, fish, lean meats, nuts, and olive oil. Limit salt. -recommend moderate walking, 3-5 times/week for 30-50 minutes each session. Aim for at least 150 minutes.week. Goal should be pace of 3 miles/hours, or walking 1.5 miles in 30 minutes -recommend avoidance of tobacco products. Avoid excess alcohol.  Dispo: Follow-up in 6 weeks, or sooner as needed.  I,Mathew Stumpf,acting as a Neurosurgeon for [employer], MD.,have documented all relevant documentation on the behalf of Zachary Red, MD,as directed by  Zachary Red, MD while in the presence of Zachary Red, MD.  I, Zachary Red, MD, have reviewed all documentation for this visit. The documentation on 08/09/22 for the exam, diagnosis, procedures, and orders are all accurate and complete.   Signed, Zachary Red, MD

## 2022-08-02 NOTE — Patient Instructions (Addendum)
We are going to change from the lisinopril-hydrochlorothiazide combo to lisinopril alone. This is because your blood pressure drops significantly with changing position. Please continue to work to stay hydrated. If you have worsening dizziness/lightheadedness, please let me know. Do not restart the amlodipine.   Orthostatic VS for the past 24 hrs (Last 3 readings):  BP- Lying Pulse- Lying BP- Sitting Pulse- Sitting BP- Standing at 0 minutes Pulse- Standing at 0 minutes BP- Standing at 3 minutes Pulse- Standing at 3 minutes  08/02/22 1425 168/87 (!) 41 130/79 (!) 49 107/69 54 99/65 67   Monitor your blood pressure at home, bring readings to follow up   Your physician recommends that you schedule a follow-up appointment in: 6 weeks with Leonel Ramsay NP or Dr Cristal Deer

## 2022-08-09 ENCOUNTER — Encounter (HOSPITAL_BASED_OUTPATIENT_CLINIC_OR_DEPARTMENT_OTHER): Payer: Self-pay | Admitting: Cardiology

## 2022-09-10 ENCOUNTER — Other Ambulatory Visit: Payer: Self-pay | Admitting: Family Medicine

## 2022-09-12 ENCOUNTER — Other Ambulatory Visit: Payer: Medicare Other

## 2022-09-12 DIAGNOSIS — N138 Other obstructive and reflux uropathy: Secondary | ICD-10-CM

## 2022-09-13 LAB — PSA: Prostate Specific Ag, Serum: 0.4 ng/mL (ref 0.0–4.0)

## 2022-09-18 ENCOUNTER — Ambulatory Visit (HOSPITAL_BASED_OUTPATIENT_CLINIC_OR_DEPARTMENT_OTHER): Payer: Medicare Other | Admitting: Family

## 2022-09-18 ENCOUNTER — Encounter (HOSPITAL_BASED_OUTPATIENT_CLINIC_OR_DEPARTMENT_OTHER): Payer: Self-pay | Admitting: Family

## 2022-09-18 VITALS — BP 176/84 | HR 50 | Ht 75.0 in | Wt 220.4 lb

## 2022-09-18 DIAGNOSIS — E785 Hyperlipidemia, unspecified: Secondary | ICD-10-CM | POA: Diagnosis not present

## 2022-09-18 DIAGNOSIS — I700 Atherosclerosis of aorta: Secondary | ICD-10-CM | POA: Diagnosis not present

## 2022-09-18 DIAGNOSIS — I10 Essential (primary) hypertension: Secondary | ICD-10-CM

## 2022-09-18 DIAGNOSIS — I2510 Atherosclerotic heart disease of native coronary artery without angina pectoris: Secondary | ICD-10-CM | POA: Diagnosis not present

## 2022-09-18 MED ORDER — AMLODIPINE BESYLATE 5 MG PO TABS: 5.0000 mg | ORAL_TABLET | Freq: Every day | ORAL | Status: DC

## 2022-09-18 NOTE — Progress Notes (Signed)
Cardiology Office Note:  .   Date:  09/20/2022  ID:  Zachary Rowe, DOB 1955/12/09, MRN [id number] PCP: Bradd Canary, MD  [facility] Providers Cardiologist:  Jodelle Red, MD    History of Present Illness: .   Zachary Rowe is a 67 y.o. male with history of aortic atherosclerosis, hypertension, hyperlipidemia, prior tobacco use.  Prior monitor sick/2024 predominantly normal sinus rhythm with first-degree AV block.  4 runs of SVT.  No high risk arrhythmias.  24 triggered events associated with sinus rhythm.  After completing monitor report episode of dizziness 07/03/2022 in the evening after workout [place] fell to the ground without injury.  Seen 08/02/22 with presyncope and was orthostatic on exam. Lisinopril-hydrochlorothiazide was transitioned to Lisinopril. Encouraged to hydrate. Amlodipine previously held for hypotension.   Presents today for follow up independently. No recurrent lightheadedness, dizziness. Has BP cuff at home but not checking routinely. Works out with a Systems analyst. Last season was part of a run club but has not yet decided if he is goingto participate this year.  Endorses following a healthy diet. Reports no shortness of breath nor dyspnea on exertion. Reports no chest pain, pressure, or tightness. No edema, orthopnea, PND. Reports no palpitations.    ROS: Please see the history of present illness.    All other systems reviewed and are negative.   Studies Reviewed: Marland Kitchen   EKG Interpretation Date/Time:  Monday September 18 2022 14:05:52 EDT Ventricular Rate:  50 PR Interval:  198 QRS Duration:  102 QT Interval:  428 QTC Calculation: 390 R Axis:   -29  Text Interpretation: Sinus bradycardia No previous ECGs available Confirmed by Gillian Shields ([id number]) on 09/18/2022 2:32:40 PM    Cardiac Studies & Procedures     STRESS TESTS  EXERCISE TOLERANCE TEST (ETT) 06/22/2016  Narrative  Blood pressure demonstrated a normal response to  exercise.  There was no ST segment deviation noted during stress.  Normal exercise stress test with no EKG criteria of ischemia.  The patient exercised for 11.8 mets achieving 86% MPHR. He has no symtpoms.  This is a low risk study.     MONITORS  LONG TERM MONITOR-LIVE TELEMETRY (3-14 DAYS) 07/04/2022  Narrative Patch Wear Time:  12 days and 19 hours (2024-06-13T11:40:46-0400 to 2024-06-26T07:16:03-398)  Patient had a min HR of 37 bpm, max HR of 152 bpm, and avg HR of 57 bpm. Predominant underlying rhythm was Sinus Rhythm. First Degree AV Block was present. 4 Supraventricular Tachycardia runs occurred, the run with the fastest interval lasting 5 beats with a max rate of 152 bpm, the longest lasting 8 beats with an avg rate of 90 bpm. No VT, atrial fibrillation, high degree block, or pauses noted. Isolated atrial and ventricular ectopy was rare (<1%). There were 24 triggered events, which were sinus. No high risk arrhythmias detected.           Risk Assessment/Calculations:            Physical Exam:   VS:  BP (!) 176/84 (BP Location: Left Arm, Patient Position: Sitting, Cuff Size: Large)   Pulse (!) 50   Ht 6\' 3"  (1.905 m)   Wt 220 lb 6.4 oz (100 kg)   BMI 27.55 kg/m    Wt Readings from Last 3 Encounters:  09/18/22 220 lb 6.4 oz (100 kg)  08/02/22 214 lb (97.1 kg)  06/01/22 210 lb (95.3 kg)    GEN: Well nourished, well developed in no acute distress NECK: No  JVD; No carotid bruits CARDIAC: RRR, no murmurs, rubs, gallops RESPIRATORY:  Clear to auscultation without rales, wheezing or rhonchi  ABDOMEN: Soft, non-tender, non-distended EXTREMITIES:  No edema; No deformity   ASSESSMENT AND PLAN: .    Syncope -monitor 07/04/2022 no significant arrhythmias.  Episode felt to be orthostatic and hydrochlorothiazide was discontinued.  No recurrence, no indication for further workup at this time.  Hypertension-lisinopril-HCTZ previously transition to lisinopril alone due to orthostasis,  near syncope.  Amlodipine previously discontinued due to near syncope.  However, BP today markedly elevated on exam.  He has not been checking at home routinely but will begin to do so.  Continue lisinopril.  Resume amlodipine 5 mg daily.  Could further uptitrate as needed.  Aortic atherosclerosis / Coronary calcification on CT / HLD, LDL goal <70 - Stable with no anginal symptoms. No indication for ischemic evaluation.  GDMT atorvastatin.       Dispo: follow up 6-8 weeks  Signed, Alver Sorrow, NP

## 2022-09-18 NOTE — Patient Instructions (Addendum)
Medication Instructions:   Your physician has recommended you make the following change in your medication:   RESUME Amlodipine 5mg  daily  CONTINUE Lisinopril 20mg  daily   *If you need a refill on your cardiac medications before your next appointment, please call your pharmacy*  Testing/Procedures: Your EKG today showed sinus bradycardia which is a normal heart rate that is a little bit slow. This is stable for you.   Follow-Up: At [facility], you and your health needs are our priority.  As part of our continuing mission to provide you with exceptional heart care, we have created designated Provider Care Teams.  These Care Teams include your primary Cardiologist (physician) and Advanced Practice Providers (APPs -  Physician Assistants and Nurse Practitioners) who all work together to provide you with the care you need, when you need it.  We recommend signing up for the patient portal called "MyChart".  Sign up information is provided on this After Visit Summary.  MyChart is used to connect with patients for Virtual Visits (Telemedicine).  Patients are able to view lab/test results, encounter notes, upcoming appointments, etc.  Non-urgent messages can be sent to your provider as well.   To learn more about what you can do with MyChart, go to [email or web address].    Your next appointment:   6-8 week(s)  Provider:   Jodelle Red, MD or Gillian Shields, NP    Other Instructions  Heart Healthy Diet Recommendations: A low-salt diet is recommended. Meats should be grilled, baked, or boiled. Avoid fried foods. Focus on lean protein sources like fish or chicken with vegetables and fruits. The American Heart Association is a Chief Technology Officer!  American Heart Association Diet and Lifeystyle Recommendations   Exercise recommendations: The American Heart Association recommends 150 minutes of moderate intensity exercise weekly. Try 30 minutes of moderate intensity exercise 4-5  times per week. This could include walking, jogging, or swimming.

## 2022-09-19 ENCOUNTER — Ambulatory Visit: Payer: Medicare Other | Admitting: Urology

## 2022-09-20 ENCOUNTER — Encounter (HOSPITAL_BASED_OUTPATIENT_CLINIC_OR_DEPARTMENT_OTHER): Payer: Self-pay | Admitting: Family

## 2022-09-27 ENCOUNTER — Ambulatory Visit (INDEPENDENT_AMBULATORY_CARE_PROVIDER_SITE_OTHER): Payer: Medicare Other | Admitting: Urology

## 2022-09-27 ENCOUNTER — Encounter: Payer: Self-pay | Admitting: Urology

## 2022-09-27 VITALS — BP 119/71 | HR 53 | Ht 75.0 in | Wt 215.0 lb

## 2022-09-27 DIAGNOSIS — N401 Enlarged prostate with lower urinary tract symptoms: Secondary | ICD-10-CM | POA: Diagnosis not present

## 2022-09-27 DIAGNOSIS — N529 Male erectile dysfunction, unspecified: Secondary | ICD-10-CM | POA: Diagnosis not present

## 2022-09-27 DIAGNOSIS — N138 Other obstructive and reflux uropathy: Secondary | ICD-10-CM

## 2022-09-27 MED ORDER — TADALAFIL 5 MG PO TABS: 5.0000 mg | ORAL_TABLET | Freq: Every day | ORAL | 3 refills | Status: AC | PRN

## 2022-09-27 NOTE — Progress Notes (Signed)
Assessment: 1. BPH with obstruction/lower urinary tract symptoms   2. Erectile dysfunction of organic origin     Plan: Today had a long discussion with the patient regarding his lower urinary tract symptoms and erectile dysfunction.  We discussed medical management options in detail today. Following our discussion today, patient elects to begin new medical therapy. Rx: Tadalafil 5 mg daily Nature of medication including proper utilization as well as potential adverse events and side effects reviewed. Patient return in 6 months or sooner if problems arise Symptom assessment on follow-up  Chief Complaint: Luts/ED  HPI: Zachary Rowe is a 67 y.o. male who presents for continued evaluation of BPH/lower urinary tract symptoms. Patient has new c/o ED. this was not mentioned at his prior visit but he states that over the past year or so he has had progressive worsening erectile dysfunction now to the point where he does not get rigid enough for penetration.   Please see my note 06/01/2022 at the time of initial visit for detailed history and exam. Patient presented with a history of worsening lower urinary tract symptoms over the last few years.  He also has a past medical history of hypertension and CKD with a creatinine in the 1.6-1.8 range.  Patient was prescribed tamsulosin at the time of his initial visit but only took it a few days because it made him dizzy.  Baseline IPSS = 14 Ipss today= 8 PVR = 1 There is a family history of prostate cancer in maternal uncle DRE = 40 g benign feeling prostate   PSA DATA: 03/2014             0.65 05/2016             0.35 05/2017             2.90  saw urologist- observed back to baseline 11/20191.50 03/2018             0.59 07/2020             0.32 09/2022  0.40   Portions of the above documentation were copied from a prior visit for review purposes only.  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   Arthritis    Back pain 04/02/2014   Bilateral carotid bruits 11/07/2016   Bradycardia 08/06/2016   Chicken pox as a child   Chronic sinusitis 01-03-2000   Constipation 08/06/2016   Depression with anxiety 05/07/2016   Elevated BP 09/02/2010   Family history of liver cancer    Family history of lung cancer    Family history of prostate cancer    Fatigue 11/01/2010   Hard of hearing    wears bilateral hearing aids   History of chicken pox 09/02/2010   History of measles 09/02/2010   HTN (hypertension) 09/02/2010   Hyperglycemia 08/04/2016   Hyperlipidemia    Hypertension    Measles as a child   Multiple allergies 09/02/2010   Overweight(278.02) 09/02/2010   Personal history of colonic polyps - adenoma 04/17/2007   04/2007 - 3 sigmoid polyps - at least 1 tubular adenoma   Peyronie disease 10/14/2012   Preventative health care 11/17/2012   Raynaud disease 04/11/2011   Thyroid disease     PSH: Past Surgical History:  Procedure Laterality Date   COLONOSCOPY  03/06/2013   x2 repeated 2020   SKIN BIOPSY     BB in forehead  SH: Social History   Tobacco Use   Smoking status: Former    Current packs/day: 0.00    Types: Cigarettes    Quit date: 01/03/2007    Years since quitting: 15.7   Smokeless tobacco: Former    Types: Chew    Quit date: 2016   Tobacco comments:    dip  Vaping Use   Vaping status: Never Used  Substance Use Topics   Alcohol use: Yes    Alcohol/week: 15.0 - 20.0 standard drinks of alcohol    Types: 15 - 20 Glasses of wine per week   Drug use: No    ROS: Constitutional:  Negative for fever, chills, weight loss CV: Negative for chest pain, previous MI, hypertension Respiratory:  Negative for shortness of breath, wheezing, sleep apnea, frequent cough GI:  Negative for nausea, vomiting, bloody stool, GERD  PE: BP 119/71   Pulse (!) 53   Ht 6\' 3"  (1.905 m)   Wt 215 lb (97.5 kg)   BMI 26.87 kg/m  GENERAL APPEARANCE:   Well appearing, well developed, well nourished, NAD    Results: UA neg

## 2022-10-02 LAB — URINALYSIS, ROUTINE W REFLEX MICROSCOPIC
Bilirubin, UA: NEGATIVE
Glucose, UA: NEGATIVE
Leukocytes,UA: NEGATIVE
Nitrite, UA: NEGATIVE
RBC, UA: NEGATIVE
Specific Gravity, UA: 1.025 (ref 1.005–1.030)
Urobilinogen, Ur: 1 mg/dL (ref 0.2–1.0)
pH, UA: 6 (ref 5.0–7.5)

## 2022-10-04 ENCOUNTER — Other Ambulatory Visit: Payer: Self-pay | Admitting: Family Medicine

## 2022-10-04 DIAGNOSIS — I10 Essential (primary) hypertension: Secondary | ICD-10-CM

## 2022-10-05 ENCOUNTER — Telehealth: Payer: Self-pay | Admitting: Family Medicine

## 2022-10-05 ENCOUNTER — Encounter (HOSPITAL_BASED_OUTPATIENT_CLINIC_OR_DEPARTMENT_OTHER): Payer: Self-pay

## 2022-10-05 DIAGNOSIS — I10 Essential (primary) hypertension: Secondary | ICD-10-CM

## 2022-10-05 MED ORDER — AMLODIPINE BESYLATE 5 MG PO TABS: 5.0000 mg | ORAL_TABLET | Freq: Every day | ORAL | 3 refills | Status: DC

## 2022-10-05 NOTE — Telephone Encounter (Signed)
Pharmacy is calling to confirm it is okay for a manufacturer change with pt's levothyroxine. Changing from amneal to lupin.    [facility] - [place], [place] - [address] [place] [address] Phone: [phone]  Fax: [phone]

## 2022-10-06 NOTE — Telephone Encounter (Signed)
Aram Beecham from [facility] called back and stated that they need a verbal order or a letter faxed to them stating the approval to change the manufacturer on the medication. Tele number is [phone]. Fax #: [phone].

## 2022-10-09 ENCOUNTER — Telehealth: Payer: Self-pay | Admitting: Family Medicine

## 2022-10-09 NOTE — Telephone Encounter (Signed)
[facility] called to see if DR. Abner Greenspan is okay with a manufacturer change on patient's levothyroxine 88 mcg. Please call #[phone]. Ref # [id number]

## 2022-10-09 NOTE — Telephone Encounter (Signed)
Called [facility] back and verbal order was given  to change the manufacturer on the medication per Dr.Blyth.

## 2022-11-05 NOTE — Progress Notes (Unsigned)
Cardiology Office Note:    Date:  11/06/2022  ID:  ESTEPHAN GALLARDO, DOB [birth year], MRN [id number] PCP: Bradd Canary, MD  [facility] Providers Cardiologist:  Jodelle Red, MD       Patient Profile:      Aortic atherosclerosis Hypertension Hyperlipidemia Prior tobacco use      History of Present Illness:  Discussed the use of AI scribe software for clinical note transcription with the patient, who gave verbal consent to proceed.  Zachary Rowe is a 67 y.o. male who returns for follow-up for his hypertension.  ER monitor 06/22/2022 showing predominantly sinus rhythm, first-degree AV block was present.  4 SVT runs occurred.  SVT, atrial fibrillation, high degree block, or pauses noted.  There were 24 triggered events which were all sinus.  On 07/03/2018 4 in the evening after his workout class he reported an episode of dizziness where he softly fell to the ground without injury and his dizziness lasted around 5 minutes before subsiding.    He was seen by Dr. Cristal Deer on 08/02/2022 where he reported near syncope with dizziness a handful of times.  He was orthostatic in clinic and his lisinopril-hydrochlorothiazide was transitioned to just lisinopril. Of note on 06/12/2022 he had messaged the office due to dizziness and his amlodipine was held due to hypotension.  Last seen by Luther Parody, NP on 09/18/2022, he reported no recurrent lightheadedness or dizziness.  His blood pressure was markedly elevated in clinic at 176/84. He was to continue his lisinopril and begin his amlodipine 5 mg daily again.  Today:  He presents today for follow-up independently.  He is a physically active individual who continues to workout at least 3 times a week.  He endorses a healthy diet.  He notes he takes his blood pressure infrequently at home, he does have a wrist cuff.  He notes on 09/21/2022 shortly after his amlodipine was restarted he did have an episode of dizziness and lightheadedness with  associated hypotension at 82/44.  However after this his home blood pressures have ranged from 130s to 150s with one reading in the 160s after this event.  He denies any other lightheadedness/dizziness events.  He is not having any chest pain, pressure, tightness.  No lower extremity edema, orthopnea, PND.  He denies any further orthostatic hypotension while at the gym or working out, he notes he has been staying hydrated.            Review of Systems  Constitutional: Negative for weight gain and weight loss.  Cardiovascular:  Negative for chest pain, claudication, cyanosis, dyspnea on exertion, irregular heartbeat, leg swelling, near-syncope, orthopnea, palpitations, paroxysmal nocturnal dyspnea and syncope.  Respiratory:  Negative for cough, hemoptysis and shortness of breath.   Gastrointestinal:  Negative for abdominal pain, hematochezia and melena.  Genitourinary:  Negative for hematuria.  Neurological:  Negative for dizziness, headaches and light-headedness.     See HPI     Studies Reviewed:       Cardiac Monitor 07/04/22 Risk Assessment/Calculations:             Physical Exam:   VS:  BP 136/78 (BP Location: Right Arm)   Pulse (!) 53   Temp 97.9 F (36.6 C) (Temporal)   Resp 18   Ht 6\' 3"  (1.905 m)   Wt 214 lb 6.4 oz (97.3 kg)   SpO2 99%   BMI 26.80 kg/m    Wt Readings from Last 3 Encounters:  11/06/22 214 lb 6.4 oz (  97.3 kg)  09/27/22 215 lb (97.5 kg)  09/18/22 220 lb 6.4 oz (100 kg)    Constitutional:      Appearance: Normal and healthy appearance.  Neck:     Vascular: JVD normal.  Pulmonary:     Effort: Pulmonary effort is normal.     Breath sounds: Normal breath sounds.  Chest:     Chest wall: Not tender to palpatation.  Cardiovascular:     PMI at left midclavicular line. Normal rate. Regular rhythm. Normal S1. Normal S2.      Murmurs: There is no murmur.     No gallop.  No click. No rub.  Pulses:    Intact distal pulses.  Edema:    Peripheral edema  absent.  Musculoskeletal: Normal range of motion.     Cervical back: Normal range of motion and neck supple. Skin:    General: Skin is warm and dry.  Neurological:     General: No focal deficit present.     Mental Status: Alert and oriented to person, place and time.  Psychiatric:        Behavior: Behavior is cooperative.        Assessment and Plan:  Hypertension -Lisinopril-HCTZ previously transitioned to Lisinopril 20mg  alone due to orthostasis, amlodipine 5 mg recently added due to markedly elevated BPs -Hypotension with associated dizziness/lightheadedness event on 9/19 with BP 82/44. Uses wrist cuff/encouraged bicep cuff. -He will obtain daily a.m./p.m. BP readings at home over the next week and send to [place] via MyChart -If he stays consistently over 130/80, we will increase his amlodipine to 10 mg. -Continue lisinopril, amlodipine -Update CBC, CMP  Hyperlipidemia / aortic atherosclerosis / coronary calcification on CT -Stable with no anginal symptoms, no indication for ischemic evaluation -Continue GDMT atorvastatin 20 mg -Plan to repeat lipid panel today  Syncope and collapse -Thought to be related to orthostasis.  HCTZ was discontinued after this event. -Heart monitor 07-04-22 with no significant arrhythmia -He denies any reoccurrences, no indication for workup -Encourage adequate hydration                 Dispo:  Return in about 6 months (around 05/06/2023).  Signed, Denyce Robert, NP

## 2022-11-06 ENCOUNTER — Ambulatory Visit (INDEPENDENT_AMBULATORY_CARE_PROVIDER_SITE_OTHER): Payer: Medicare Other | Admitting: Emergency Medicine

## 2022-11-06 ENCOUNTER — Encounter (HOSPITAL_BASED_OUTPATIENT_CLINIC_OR_DEPARTMENT_OTHER): Payer: Self-pay | Admitting: Emergency Medicine

## 2022-11-06 VITALS — BP 136/78 | HR 53 | Temp 97.9°F | Resp 18 | Ht 75.0 in | Wt 214.4 lb

## 2022-11-06 DIAGNOSIS — R55 Syncope and collapse: Secondary | ICD-10-CM | POA: Diagnosis not present

## 2022-11-06 DIAGNOSIS — E785 Hyperlipidemia, unspecified: Secondary | ICD-10-CM | POA: Diagnosis not present

## 2022-11-06 DIAGNOSIS — I2510 Atherosclerotic heart disease of native coronary artery without angina pectoris: Secondary | ICD-10-CM | POA: Diagnosis not present

## 2022-11-06 DIAGNOSIS — I10 Essential (primary) hypertension: Secondary | ICD-10-CM | POA: Diagnosis not present

## 2022-11-06 NOTE — Patient Instructions (Addendum)
Medication Instructions:  Continue your current medications.   *If you need a refill on your cardiac medications before your next appointment, please call your pharmacy*  Lab Work: Your physician recommends that you return for lab work today: CMP, CBC, lipid panel  If you have labs (blood work) drawn today and your tests are completely normal, you will receive your results only by: MyChart Message (if you have MyChart) OR A paper copy in the mail If you have any lab test that is abnormal or we need to change your treatment, we will call you to review the results.  Follow-Up: At [facility], you and your health needs are our priority.  As part of our continuing mission to provide you with exceptional heart care, we have created designated Provider Care Teams.  These Care Teams include your primary Cardiologist (physician) and Advanced Practice Providers (APPs -  Physician Assistants and Nurse Practitioners) who all work together to provide you with the care you need, when you need it.  We recommend signing up for the patient portal called "MyChart".  Sign up information is provided on this After Visit Summary.  MyChart is used to connect with patients for Virtual Visits (Telemedicine).  Patients are able to view lab/test results, encounter notes, upcoming appointments, etc.  Non-urgent messages can be sent to your provider as well.   To learn more about what you can do with MyChart, go to [email or web address].    Your next appointment:   6 month(s)  Provider:   Jodelle Red, MD or Gillian Shields, NP    Other Instructions  Tips to Measure your Blood Pressure Correctly  Check blood pressure at least an hour after your medications.  Please check twice per day for 1 week. Send [place] readings via Mychart in one week to determine whether further changes needed.   Here's what you can do to ensure a correct reading:  Don't drink a caffeinated beverage or smoke during the  30 minutes before the test.  Sit quietly for five minutes before the test begins.  During the measurement, sit in a chair with your feet on the floor and your arm supported so your elbow is at about heart level.  The inflatable part of the cuff should completely cover at least 80% of your upper arm, and the cuff should be placed on bare skin, not over a shirt.  Don't talk during the measurement.  Blood pressure categories  Blood pressure category SYSTOLIC (upper number)  DIASTOLIC (lower number)  Normal Less than 120 mm Hg and Less than 80 mm Hg  Elevated 120-129 mm Hg and Less than 80 mm Hg  High blood pressure: Stage 1 hypertension 130-139 mm Hg or 80-89 mm Hg  High blood pressure: Stage 2 hypertension 140 mm Hg or higher or 90 mm Hg or higher  Hypertensive crisis (consult your doctor immediately) Higher than 180 mm Hg and/or Higher than 120 mm Hg  Source: American Heart Association and American Stroke Association. For more on getting your blood pressure under control, buy Controlling Your Blood Pressure, a Special Health Report from [facility].   Blood Pressure Log   Date   Time  Blood Pressure  Example: Nov 1 9 AM 124/78

## 2022-11-07 LAB — CBC
Hematocrit: 43.1 % (ref 37.5–51.0)
Hemoglobin: 14.7 g/dL (ref 13.0–17.7)
MCH: 33.5 pg — ABNORMAL HIGH (ref 26.6–33.0)
MCHC: 34.1 g/dL (ref 31.5–35.7)
MCV: 98 fL — ABNORMAL HIGH (ref 79–97)
Platelets: 247 10*3/uL (ref 150–450)
RBC: 4.39 x10E6/uL (ref 4.14–5.80)
RDW: 11.9 % (ref 11.6–15.4)
WBC: 5.9 10*3/uL (ref 3.4–10.8)

## 2022-11-07 LAB — COMPREHENSIVE METABOLIC PANEL
ALT: 37 [IU]/L (ref 0–44)
AST: 47 [IU]/L — ABNORMAL HIGH (ref 0–40)
Albumin: 4.4 g/dL (ref 3.9–4.9)
Alkaline Phosphatase: 67 [IU]/L (ref 44–121)
BUN/Creatinine Ratio: 12 (ref 10–24)
BUN: 16 mg/dL (ref 8–27)
Bilirubin Total: 0.6 mg/dL (ref 0.0–1.2)
CO2: 18 mmol/L — ABNORMAL LOW (ref 20–29)
Calcium: 8.9 mg/dL (ref 8.6–10.2)
Chloride: 103 mmol/L (ref 96–106)
Creatinine, Ser: 1.35 mg/dL — ABNORMAL HIGH (ref 0.76–1.27)
Globulin, Total: 2.9 g/dL (ref 1.5–4.5)
Glucose: 78 mg/dL (ref 70–99)
Potassium: 4.1 mmol/L (ref 3.5–5.2)
Sodium: 141 mmol/L (ref 134–144)
Total Protein: 7.3 g/dL (ref 6.0–8.5)
eGFR: 58 mL/min/{1.73_m2} — ABNORMAL LOW (ref >59)

## 2022-11-07 LAB — LIPID PANEL
Chol/HDL Ratio: 1.8 ratio (ref 0.0–5.0)
Cholesterol, Total: 173 mg/dL (ref 100–199)
HDL: 96 mg/dL (ref >39)
LDL Chol Calc (NIH): 64 mg/dL (ref 0–99)
Triglycerides: 67 mg/dL (ref 0–149)
VLDL Cholesterol Cal: 13 mg/dL (ref 5–40)

## 2022-11-09 ENCOUNTER — Telehealth (HOSPITAL_BASED_OUTPATIENT_CLINIC_OR_DEPARTMENT_OTHER): Payer: Self-pay

## 2022-11-09 NOTE — Telephone Encounter (Signed)
Called patient, no answer at this time.

## 2022-11-09 NOTE — Telephone Encounter (Signed)
-----   Message from Denyce Robert sent at 11/08/2022  8:55 AM EST ----- Your lab work is stable.  Your kidney function (creatinine) has improved.  Your LDL cholesterol is 64, which is excellent.  Good result!

## 2022-11-13 ENCOUNTER — Encounter (HOSPITAL_BASED_OUTPATIENT_CLINIC_OR_DEPARTMENT_OTHER): Payer: Self-pay

## 2022-11-13 MED ORDER — AMLODIPINE BESYLATE 10 MG PO TABS: 10.0000 mg | ORAL_TABLET | Freq: Every day | ORAL | 3 refills | Status: DC

## 2022-11-13 NOTE — Telephone Encounter (Signed)
Hey! Here is the patient's last week of blood pressure readings. Pt stated they were all taken at various times.

## 2023-01-22 NOTE — Telephone Encounter (Signed)
Lm to call back ./cy 

## 2023-01-28 ENCOUNTER — Encounter: Payer: Self-pay | Admitting: Internal Medicine

## 2023-02-14 ENCOUNTER — Other Ambulatory Visit: Payer: Self-pay | Admitting: Family Medicine

## 2023-02-14 ENCOUNTER — Encounter: Payer: Self-pay | Admitting: Family Medicine

## 2023-02-14 DIAGNOSIS — M21969 Unspecified acquired deformity of unspecified lower leg: Secondary | ICD-10-CM

## 2023-02-14 NOTE — Telephone Encounter (Signed)
Called and spoke with patient. The cysts are are on the toes and they would like a podiatry referral

## 2023-02-16 ENCOUNTER — Other Ambulatory Visit (HOSPITAL_BASED_OUTPATIENT_CLINIC_OR_DEPARTMENT_OTHER): Payer: Self-pay | Admitting: Cardiology

## 2023-02-16 DIAGNOSIS — E7800 Pure hypercholesterolemia, unspecified: Secondary | ICD-10-CM

## 2023-03-07 ENCOUNTER — Ambulatory Visit: Payer: Medicare Other | Admitting: Urology

## 2023-03-12 ENCOUNTER — Telehealth: Payer: Self-pay | Admitting: *Deleted

## 2023-03-12 NOTE — Telephone Encounter (Signed)
 Pt aware of recommendations on increasing Lisinopril to 40 mg every day and will monitor B/P and send results via mychart in the next week or so Will forward message to Rise Paganini NP for review and recommendations Attempted to schedule an appt with NP and no template  was available

## 2023-03-12 NOTE — Telephone Encounter (Deleted)
Lm to call back ./cy 

## 2023-03-12 NOTE — Telephone Encounter (Signed)
 Lm to call back re my chart message on B/P controll ./cy

## 2023-03-16 DIAGNOSIS — I951 Orthostatic hypotension: Secondary | ICD-10-CM

## 2023-03-16 DIAGNOSIS — I10 Essential (primary) hypertension: Secondary | ICD-10-CM

## 2023-03-16 MED ORDER — LISINOPRIL 40 MG PO TABS: 40.0000 mg | ORAL_TABLET | Freq: Every day | ORAL | 3 refills | Status: AC

## 2023-04-02 NOTE — Telephone Encounter (Signed)
See my chart message .Zachary Rowe

## 2023-05-10 ENCOUNTER — Ambulatory Visit: Attending: Emergency Medicine | Admitting: Emergency Medicine

## 2023-05-10 ENCOUNTER — Ambulatory Visit

## 2023-05-10 ENCOUNTER — Telehealth: Payer: Self-pay

## 2023-05-10 ENCOUNTER — Encounter: Payer: Self-pay | Admitting: Emergency Medicine

## 2023-05-10 VITALS — BP 110/52 | HR 44 | Ht 75.0 in | Wt 219.0 lb

## 2023-05-10 DIAGNOSIS — I2510 Atherosclerotic heart disease of native coronary artery without angina pectoris: Secondary | ICD-10-CM | POA: Diagnosis not present

## 2023-05-10 DIAGNOSIS — I10 Essential (primary) hypertension: Secondary | ICD-10-CM

## 2023-05-10 DIAGNOSIS — R55 Syncope and collapse: Secondary | ICD-10-CM | POA: Diagnosis present

## 2023-05-10 DIAGNOSIS — I700 Atherosclerosis of aorta: Secondary | ICD-10-CM

## 2023-05-10 DIAGNOSIS — R001 Bradycardia, unspecified: Secondary | ICD-10-CM

## 2023-05-10 DIAGNOSIS — I951 Orthostatic hypotension: Secondary | ICD-10-CM

## 2023-05-10 NOTE — Progress Notes (Incomplete)
 Cardiology Office Note:    Date:  05/10/2023  ID:  Zachary Rowe, DOB Nov 08, 1955, MRN [id number] PCP: Neda Balk, MD  [facility] Providers Cardiologist:  Sheryle Donning, MD { Click to update primary MD,subspecialty MD or APP then REFRESH:1}    {Click to Open Review  :1}   Patient Profile:      Chief Complaint: Follow-up for hypertension History of Present Illness:  Zachary Rowe is a 68 y.o. male with visit-pertinent history of hypertension, hyperlipidemia, aortic atherosclerosis, coronary calcification on CT  Zio monitor 06/22/2022 showing predominantly sinus rhythm, first-degree AV block was present.  4 SVT runs occurred.  SVT, atrial fibrillation, high degree block, or pauses noted.  There were 24 triggered events which were all sinus.  On 07/03/2022 in the evening after his workout class he reported an episode of dizziness where he softly fell to the ground without injury and his dizziness lasted around 5 minutes before subsiding.     He was seen by Dr. Veryl Gottron on 08/02/2022 where he reported near syncope with dizziness a handful of times.  He was orthostatic in clinic and his lisinopril -hydrochlorothiazide  was transitioned to just lisinopril . Of note on 06/12/2022 he had messaged the office due to dizziness and his amlodipine  was held due to hypotension.   Seen by Jerrold Morgan, NP on 09/18/2022, he reported no recurrent lightheadedness or dizziness.  His blood pressure was markedly elevated in clinic at 176/84. He was to continue his lisinopril  and begin his amlodipine  5 mg daily again.  Last seen on 11/06/2022.  He was doing well from a cardiac perspective.  He had not had 1 episode of dizziness/lightheadedness shortly after beginning his amlodipine .  Home blood pressures range 130s to 150s.  He was told to send in his blood pressure readings over the next several weeks.  His blood pressure was noted to be consistently greater than 130/80 and his amlodipine  was increased to 10  mg and lisinopril  was increased to 40 mg.  Discussed the use of AI scribe software for clinical note transcription with the patient, who gave verbal consent to proceed.  History of Present Illness Zachary Rowe is a 68 year old male with hypertension who presents with concerns about blood pressure variability and recent dizziness.  He experiences variability in blood pressure readings at home, with systolic measurements ranging from the 130s to the 160s. His blood pressure is better controlled today than at home. He measures his blood pressure a couple of hours after taking lisinopril  and amlodipine .  Dizziness has occurred on a couple of occasions in the past two weeks, lasting about two to three minutes each time. Similar dizziness occurred in the past with lisinopril -hydrochlorothiazide , resolving after discontinuing hydrochlorothiazide . He denies chest pain, headaches, syncope, presyncope or lightheadedness.  His current medications include lisinopril  and amlodipine . Orthostatic hypotension was identified during a previous evaluation with orthostatic blood pressure measurements.  He maintains an active lifestyle, working out three days a week and running two days a week, having started running about four weeks ago.  He  denies chest pain, shortness of breath, lower extremity edema, fatigue, palpitations, melena, hematuria, hemoptysis, diaphoresis, weakness, presyncope, syncope, orthopnea, and PND.  Review of systems:  Please see the history of present illness. All other systems are reviewed and otherwise negative.     Home Medications:    Current Meds  Medication Sig  . atorvastatin  (LIPITOR) 20 MG tablet TAKE 1 TABLET BY MOUTH ONCE  DAILY  . Azelastine  HCl  0.15 % SOLN USE 2 SPRAY(S) ONCE DAILY AS NEEDED  . folic acid  (FOLVITE ) 800 MCG tablet Take 400 mcg by mouth daily.  . levothyroxine  (SYNTHROID ) 88 MCG tablet TAKE 1 TABLET BY MOUTH DAILY  BEFORE BREAKFAST  . lisinopril  (ZESTRIL )  40 MG tablet Take 1 tablet (40 mg total) by mouth daily.  Aaron Aas MELATONIN PO Take 1 tablet by mouth at bedtime.  Aaron Aas omeprazole (PRILOSEC) 20 MG capsule Take 20 mg by mouth daily.  . tadalafil  (CIALIS ) 5 MG tablet Take 1 tablet (5 mg total) by mouth daily as needed for erectile dysfunction.  . thiamine 250 MG tablet Take 250 mg by mouth daily.   Studies Reviewed:       ZIO 06/12/2022 Patch Wear Time:  12 days and 19 hours (2024-06-13T11:40:46-0400 to 2024-06-26T07:16:03-398)   Patient had a min HR of 37 bpm, max HR of 152 bpm, and avg HR of 57 bpm. Predominant underlying rhythm was Sinus Rhythm. First Degree AV Block was present. 4 Supraventricular Tachycardia runs occurred, the run with the fastest interval lasting 5 beats with a max rate of 152 bpm, the longest lasting 8 beats with an avg rate of 90 bpm. No VT, atrial fibrillation, high degree block, or pauses noted. Isolated atrial and ventricular ectopy was rare (<1%). There were 24 triggered events, which were sinus. No high risk arrhythmias detected.  Risk Assessment/Calculations:             Physical Exam:   VS:  BP (!) 110/52 (BP Location: Left Arm, Patient Position: Sitting, Cuff Size: Normal)   Pulse (!) 44   Ht 6\' 3"  (1.905 m)   Wt 219 lb (99.3 kg)   SpO2 98%   BMI 27.37 kg/m    Wt Readings from Last 3 Encounters:  05/10/23 219 lb (99.3 kg)  11/06/22 214 lb 6.4 oz (97.3 kg)  09/27/22 215 lb (97.5 kg)    GEN: Well nourished, well developed in no acute distress NECK: No JVD; No carotid bruits CARDIAC: RRR, no murmurs, rubs, gallops RESPIRATORY:  Clear to auscultation without rales, wheezing or rhonchi  ABDOMEN: Soft, non-tender, non-distended EXTREMITIES:  No edema; No acute deformity     Assessment and Plan:  Hypertension Blood pressure today is 110/52 and repeat 108/54 currently under adequate control Blood pressures at home have been ranging from 130s to 160s Unfortunately he did not bring his blood pressure cuff into  the office today for comparison - Plan will be to bring patient in for nurse visit within the next 2 weeks for repeat blood pressure check and comparison with his home blood pressure monitor - Continue amlodipine  10 mg daily and lisinopril  40 mg daily  Syncope ZIO monitor 06/2022 showed no significant arrhythmias with average HR 57 bpm Previously seen in 07/2022 where he was found to be orthostatic in office and lisinopril -hydrochlorothiazide  was transition just to lisinopril . - Today he reported dizziness on a couple occasions over the past 2 weeks lasting 2 to 3 minutes each time - His EKG today did show sinus bradycardia with heart rate 42 bpm  - Plan will be to allow for some permissive hypertension in order to avoid further orthostasis - Would avoid AV nodal blocking agents in the future given baseline bradycardia  Aortic atherosclerosis / Coronary calcification on CT - Today patient is without any anginal symptoms, no indication for further ischemic evaluation at this time - Continue atorvastatin  20 mg daily     {Are you ordering a CV Procedure (e.g.  stress test, cath, DCCV, TEE, etc)?   Press F2        :[id number]}  Dispo:  Return in about 6 months (around 11/10/2023).  Signed, Ava Boatman, NP

## 2023-05-10 NOTE — Telephone Encounter (Signed)
 Patient identification verified by 2 forms. Sims Duck, RN     Called and spoke to patient. Scheduled for nurse visit BP check and home cuff check on 05/17/23   Patient agrees with plan, no questions at this time

## 2023-05-10 NOTE — Progress Notes (Unsigned)
 Cardiology Office Note:    Date:  05/10/2023  ID:  Zachary Rowe, DOB 05-11-1955, MRN [id number] PCP: Neda Balk, MD  [facility] Providers Cardiologist:  Sheryle Donning, MD { Click to update primary MD,subspecialty MD or APP then REFRESH:1}    {Click to Open Review  :1}   Patient Profile:      Chief Complaint: Follow-up for hypertension History of Present Illness:  Zachary Rowe is a 68 y.o. male with visit-pertinent history of hypertension, hyperlipidemia, aortic atherosclerosis, coronary calcification on CT  Zio monitor 06/22/2022 showing predominantly sinus rhythm, first-degree AV block was present.  4 SVT runs occurred.  SVT, atrial fibrillation, high degree block, or pauses noted.  There were 24 triggered events which were all sinus.  On 07/03/2022 in the evening after his workout class he reported an episode of dizziness where he softly fell to the ground without injury and his dizziness lasted around 5 minutes before subsiding.     He was seen by Dr. Veryl Gottron on 08/02/2022 where he reported near syncope with dizziness a handful of times.  He was orthostatic in clinic and his lisinopril -hydrochlorothiazide  was transitioned to just lisinopril . Of note on 06/12/2022 he had messaged the office due to dizziness and his amlodipine  was held due to hypotension.   Seen by Jerrold Morgan, NP on 09/18/2022, he reported no recurrent lightheadedness or dizziness.  His blood pressure was markedly elevated in clinic at 176/84. He was to continue his lisinopril  and begin his amlodipine  5 mg daily again.  Last seen on 11/06/2022.  He was doing well from a cardiac perspective.  He had not had 1 episode of dizziness/lightheadedness shortly after beginning his amlodipine .  Home blood pressures range 130s to 150s.  He was told to send in his blood pressure readings over the next several weeks.  His blood pressure was noted to be consistently greater than 130/80 and his amlodipine  was increased to 10  mg and lisinopril  was increased to 40 mg.  Discussed the use of AI scribe software for clinical note transcription with the patient, who gave verbal consent to proceed.  History of Present Illness Zachary Rowe is a 68 year old male with hypertension who presents with concerns about blood pressure variability and recent dizziness.  He experiences variability in blood pressure readings at home, with systolic measurements ranging from the 130s to the 160s. His blood pressure is better controlled today than at home. He measures his blood pressure a couple of hours after taking lisinopril  and amlodipine .  Dizziness has occurred on a couple of occasions in the past two weeks, lasting about two to three minutes each time. Similar dizziness occurred in the past with lisinopril -hydrochlorothiazide , resolving after discontinuing hydrochlorothiazide . He denies chest pain, headaches, syncope, presyncope or lightheadedness.  His current medications include lisinopril  and amlodipine . Orthostatic hypotension was identified during a previous evaluation with orthostatic blood pressure measurements.  He maintains an active lifestyle, working out three days a week and running two days a week, having started running about four weeks ago.  He  denies chest pain, shortness of breath, lower extremity edema, fatigue, palpitations, melena, hematuria, hemoptysis, diaphoresis, weakness, presyncope, syncope, orthopnea, and PND.  Review of systems:  Please see the history of present illness. All other systems are reviewed and otherwise negative.     Home Medications:    Current Meds  Medication Sig  . atorvastatin  (LIPITOR) 20 MG tablet TAKE 1 TABLET BY MOUTH ONCE  DAILY  . Azelastine  HCl  0.15 % SOLN USE 2 SPRAY(S) ONCE DAILY AS NEEDED  . folic acid  (FOLVITE ) 800 MCG tablet Take 400 mcg by mouth daily.  . levothyroxine  (SYNTHROID ) 88 MCG tablet TAKE 1 TABLET BY MOUTH DAILY  BEFORE BREAKFAST  . lisinopril  (ZESTRIL )  40 MG tablet Take 1 tablet (40 mg total) by mouth daily.  Aaron Aas MELATONIN PO Take 1 tablet by mouth at bedtime.  Aaron Aas omeprazole (PRILOSEC) 20 MG capsule Take 20 mg by mouth daily.  . tadalafil  (CIALIS ) 5 MG tablet Take 1 tablet (5 mg total) by mouth daily as needed for erectile dysfunction.  . thiamine 250 MG tablet Take 250 mg by mouth daily.   Studies Reviewed:       ZIO 06/12/2022 Patch Wear Time:  12 days and 19 hours (2024-06-13T11:40:46-0400 to 2024-06-26T07:16:03-398)   Patient had a min HR of 37 bpm, max HR of 152 bpm, and avg HR of 57 bpm. Predominant underlying rhythm was Sinus Rhythm. First Degree AV Block was present. 4 Supraventricular Tachycardia runs occurred, the run with the fastest interval lasting 5 beats with a max rate of 152 bpm, the longest lasting 8 beats with an avg rate of 90 bpm. No VT, atrial fibrillation, high degree block, or pauses noted. Isolated atrial and ventricular ectopy was rare (<1%). There were 24 triggered events, which were sinus. No high risk arrhythmias detected.  Risk Assessment/Calculations:             Physical Exam:   VS:  BP (!) 110/52 (BP Location: Left Arm, Patient Position: Sitting, Cuff Size: Normal)   Pulse (!) 44   Ht 6\' 3"  (1.905 m)   Wt 219 lb (99.3 kg)   SpO2 98%   BMI 27.37 kg/m    Wt Readings from Last 3 Encounters:  05/10/23 219 lb (99.3 kg)  11/06/22 214 lb 6.4 oz (97.3 kg)  09/27/22 215 lb (97.5 kg)    GEN: Well nourished, well developed in no acute distress NECK: No JVD; No carotid bruits CARDIAC: RRR, no murmurs, rubs, gallops RESPIRATORY:  Clear to auscultation without rales, wheezing or rhonchi  ABDOMEN: Soft, non-tender, non-distended EXTREMITIES:  No edema; No acute deformity     Assessment and Plan:  Hypertension Blood pressure today is 110/52 and repeat 108/54 currently under adequate control Blood pressures at home have been ranging from 130s to 160s Unfortunately he did not bring his blood pressure cuff into  the office today for comparison - Plan will be to bring patient in for nurse visit within the next 2 weeks for repeat blood pressure check and comparison with his home blood pressure monitor - Continue amlodipine  10 mg daily and lisinopril  40 mg daily  Syncope ZIO monitor 06/2022 showed no significant arrhythmias with average HR 57 bpm Previously seen in 07/2022 where he was found to be orthostatic in office and lisinopril -hydrochlorothiazide  was transition just to lisinopril . - Today he reported dizziness on a couple occasions over the past 2 weeks lasting 2 to 3 minutes each time - His EKG today did show sinus bradycardia with heart rate 42 bpm  - Plan will be to allow for some permissive hypertension in order to avoid further orthostasis - Would avoid AV nodal blocking agents in the future given baseline bradycardia  Aortic atherosclerosis / Coronary calcification on CT - Today patient is without any anginal symptoms, no indication for further ischemic evaluation at this time - Continue atorvastatin  20 mg daily     {Are you ordering a CV Procedure (e.g.  stress test, cath, DCCV, TEE, etc)?   Press F2        :[id number]}  Dispo:  Return in about 6 months (around 11/10/2023).  Signed, Ava Boatman, NP

## 2023-05-10 NOTE — Patient Instructions (Addendum)
 Medication Instructions:  NO CHANGES   Lab Work: NONE   Testing/Procedures: NONE  Follow-Up: At [facility], you and your health needs are our priority.  As part of our continuing mission to provide you with exceptional heart care, our providers are all part of one team.  This team includes your primary Cardiologist (physician) and Advanced Practice Providers or APPs (Physician Assistants and Nurse Practitioners) who all work together to provide you with the care you need, when you need it.  Your next appointment:   1 WEEK FOR NURSE VISIT TO CHECK BLOOD PRESSURE AND BLOOD PRESSURE CUFF 6 MONTHS  Provider:   Sheryle Donning, MD OR Palmer Bobo, [place]

## 2023-05-13 ENCOUNTER — Encounter: Payer: Self-pay | Admitting: Emergency Medicine

## 2023-05-17 ENCOUNTER — Ambulatory Visit: Attending: Cardiovascular Disease

## 2023-05-17 DIAGNOSIS — I10 Essential (primary) hypertension: Secondary | ICD-10-CM | POA: Insufficient documentation

## 2023-05-17 NOTE — Patient Instructions (Signed)
 Medication Instructions:   *If you need a refill on your cardiac medications before your next appointment, please call your pharmacy*  Lab Work:  If you have labs (blood work) drawn today and your tests are completely normal, you will receive your results only by: MyChart Message (if you have MyChart) OR A paper copy in the mail If you have any lab test that is abnormal or we need to change your treatment, we will call you to review the results.  Testing/Procedures:   Follow-Up: At [facility], you and your health needs are our priority.  As part of our continuing mission to provide you with exceptional heart care, our providers are all part of one team.  This team includes your primary Cardiologist (physician) and Advanced Practice Providers or APPs (Physician Assistants and Nurse Practitioners) who all work together to provide you with the care you need, when you need it.   We recommend signing up for the patient portal called "MyChart".  Sign up information is provided on this After Visit Summary.  MyChart is used to connect with patients for Virtual Visits (Telemedicine).  Patients are able to view lab/test results, encounter notes, upcoming appointments, etc.  Non-urgent messages can be sent to your provider as well.   To learn more about what you can do with MyChart, go to [email or web address].   Other Instructions

## 2023-05-17 NOTE — Progress Notes (Signed)
   Nurse Visit   Date of Encounter: 05/17/2023 ID: Zachary Rowe, DOB Oct 23, 1955, MRN [id number]  PCP:  Neda Balk, MD   [facility] Providers Cardiologist:  Sheryle Donning, MD      Visit Details   VS:  BP 126/68 (BP Location: Right Arm, Patient Position: Sitting, Cuff Size: Normal)   Pulse (!) 48   Ht 6\' 3"  (1.905 m)   Wt 219 lb (99.3 kg)   SpO2 98%   BMI 27.37 kg/m  , BMI Body mass index is 27.37 kg/m.  Wt Readings from Last 3 Encounters:  05/17/23 219 lb (99.3 kg)  05/10/23 219 lb (99.3 kg)  11/06/22 214 lb 6.4 oz (97.3 kg)     Reason for visit: BP check to check against his home cuff per Palmer Bobo NP Performed today: vital signs  Changes (medications, testing, etc.) : no changes  Length of Visit: 10 minutes   Pt was sent to have a BP check today to see how his readings at home to compare to our here in the office manually.   My readings were about 15-18 points less than his cuff from home. He will take this into consideration when checking at home and let us  know if any higher than "normal" readings.   Pt says that he is feeling well and will let us  know if he needs us .    Medications Adjustments/Labs and Tests Ordered: No orders of the defined types were placed in this encounter.  No orders of the defined types were placed in this encounter.    Signed, Alanna Alley, RN  05/17/2023 2:22 PM

## 2023-05-21 IMAGING — US US ABDOMEN COMPLETE
1 series · 14 of 25 positions shown · non-contrast
Comparison: 10/02/2014 CT abdomen

CLINICAL DATA: Abdominal pain

Elevated liver function tests
EXAM:
ABDOMEN ULTRASOUND COMPLETE

[Series 1: us abdomen complete · 14 of 68 slices shown]
[im 1/68]
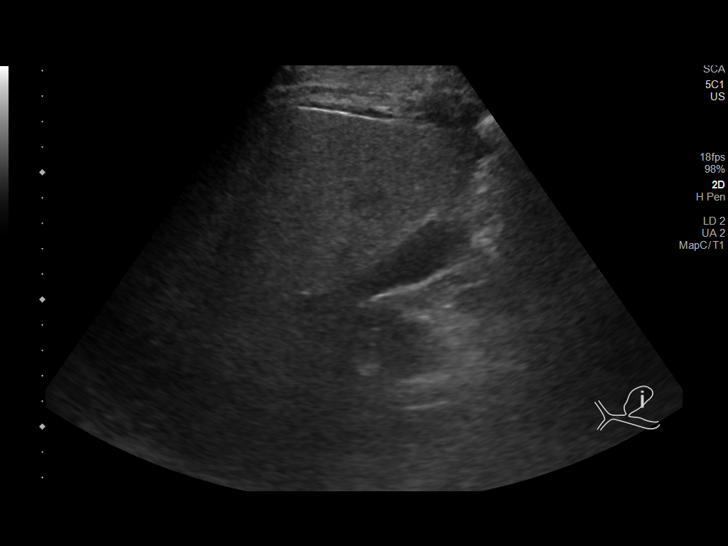
[im 6/68]
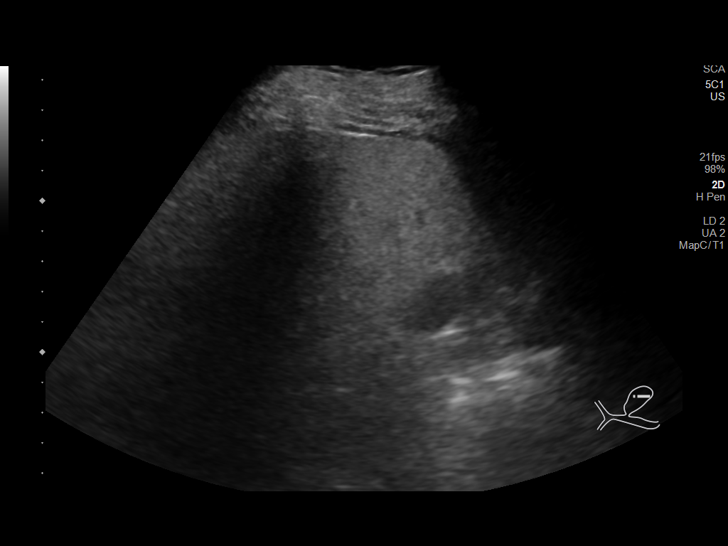
[im 12/68]
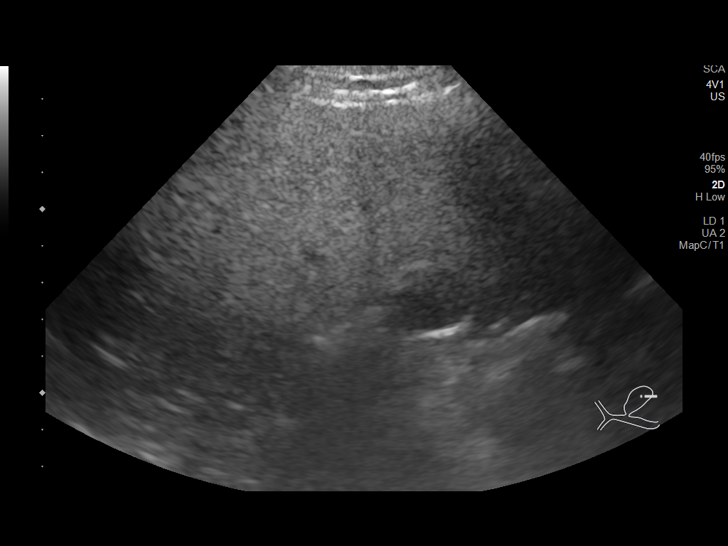
[im 17/68]
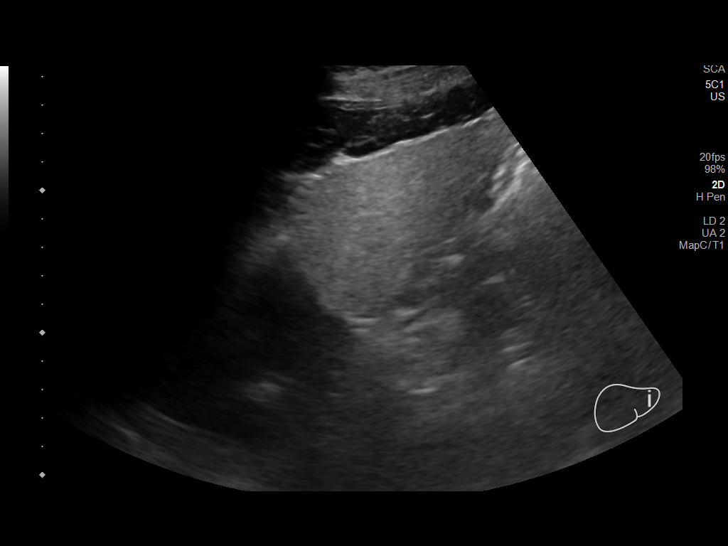
[im 23/68]
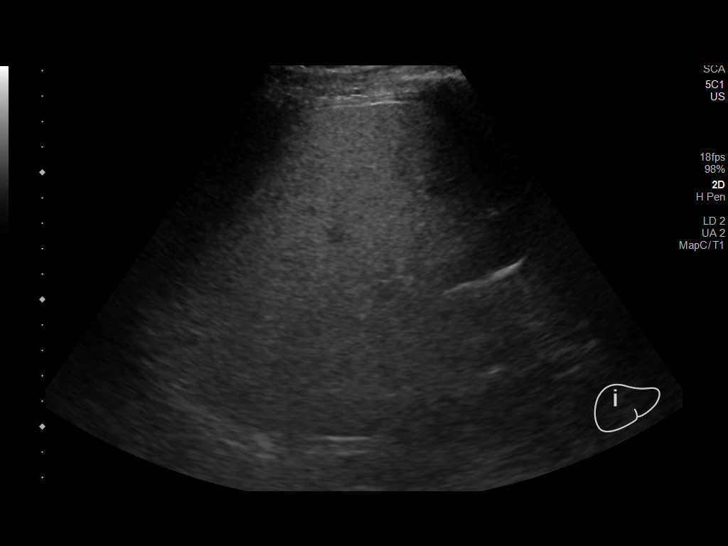
[im 26/68]
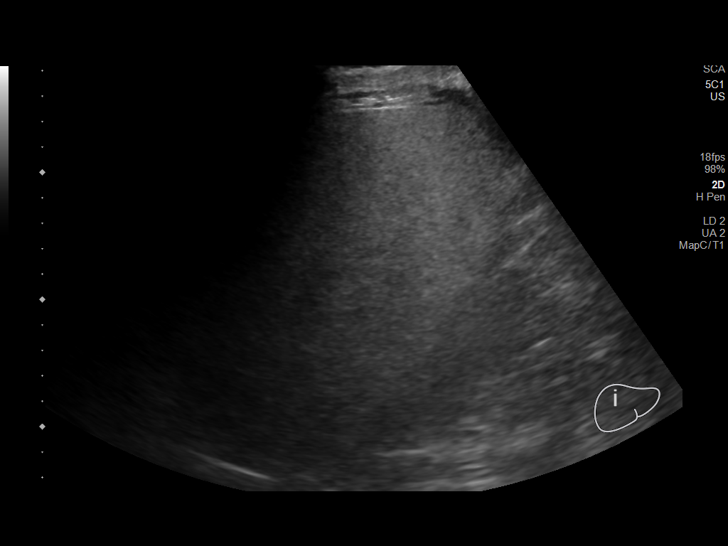
[im 31/68]
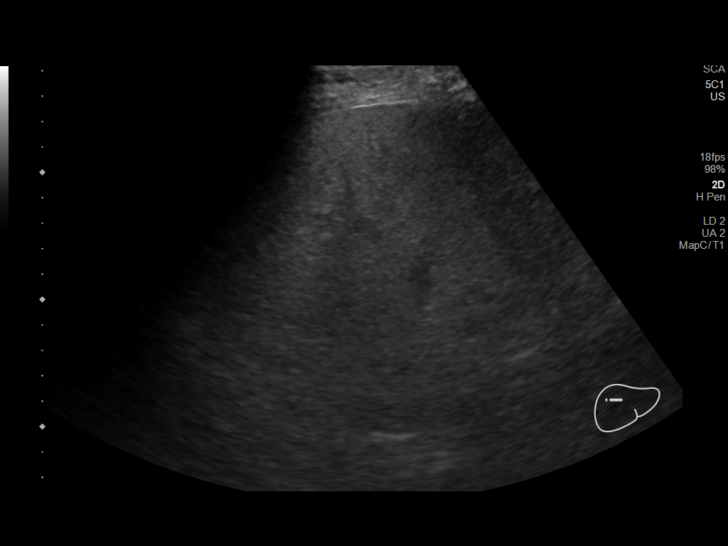
[im 37/68]
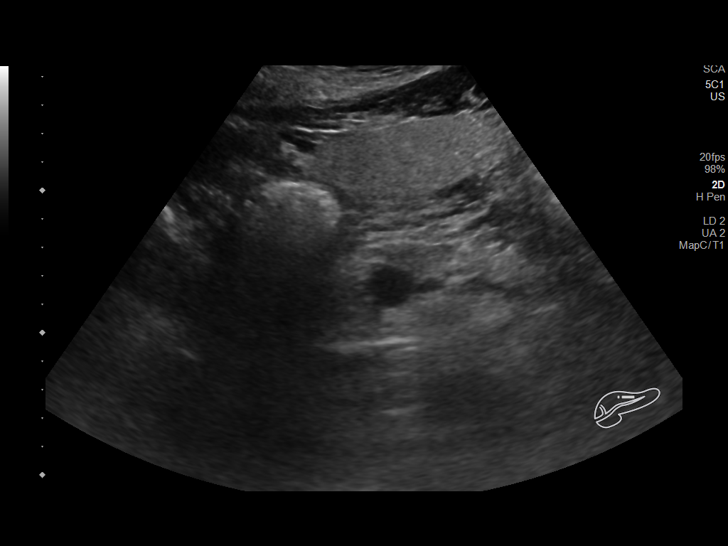
[im 42/68]
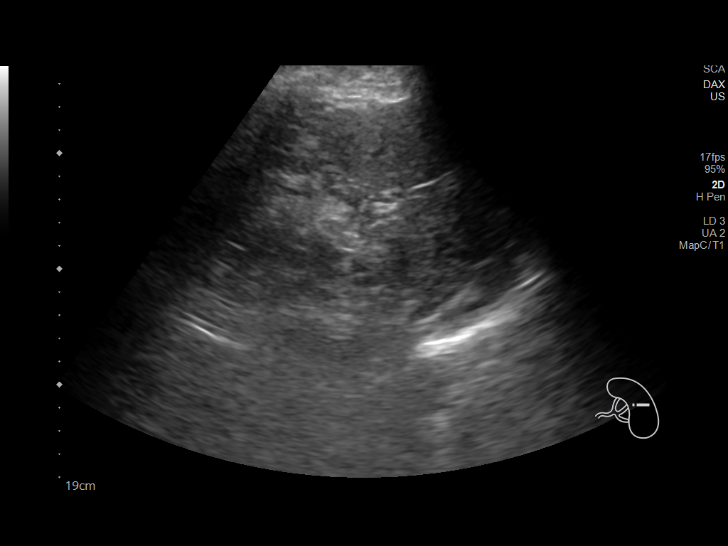
[im 45/68]
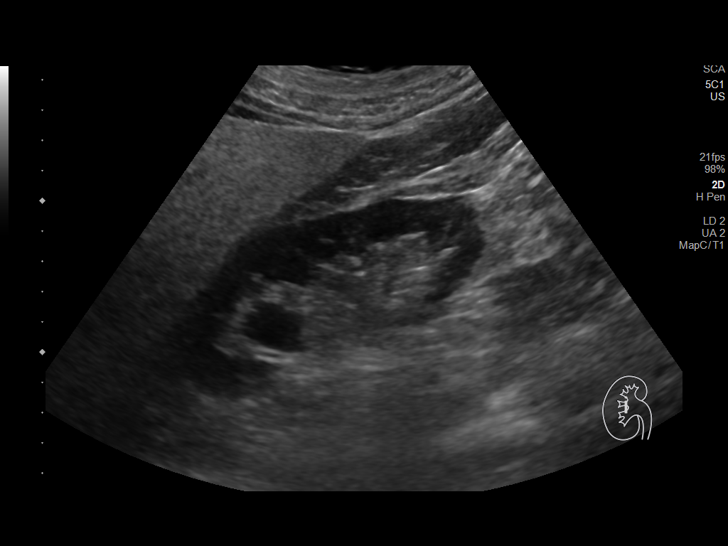
[im 51/68]
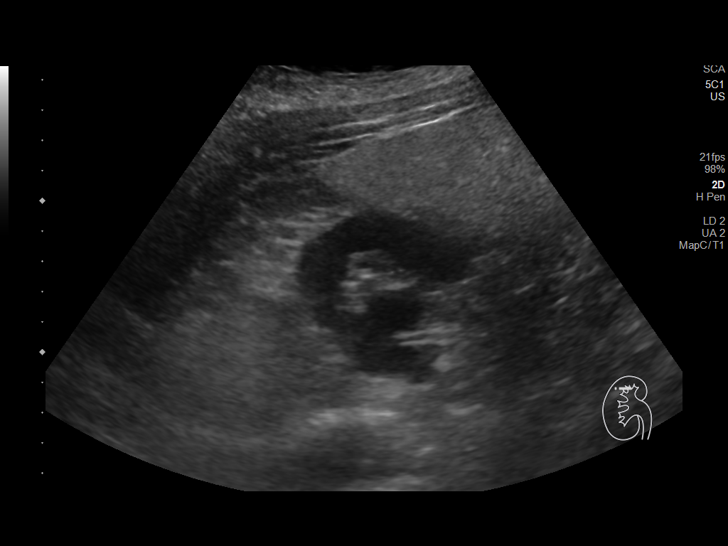
[im 56/68]
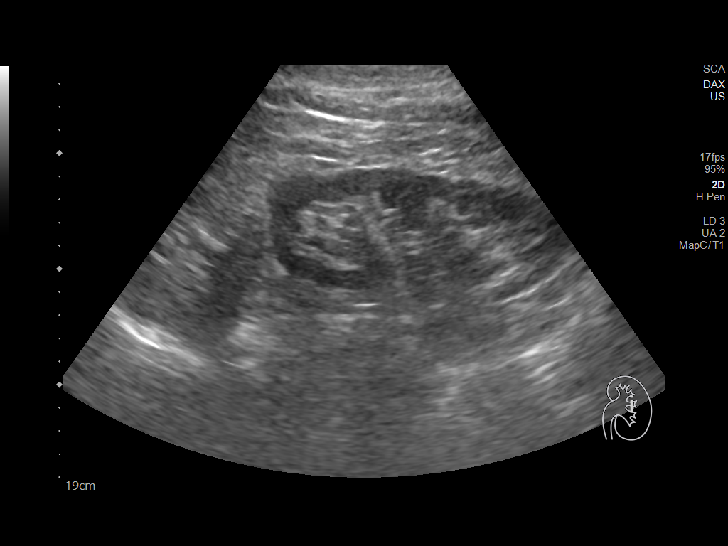
[im 62/68]
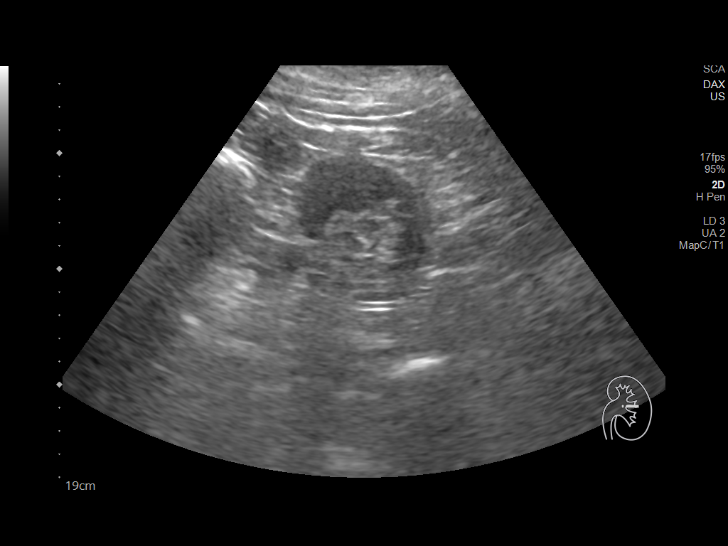
[im 68/68]
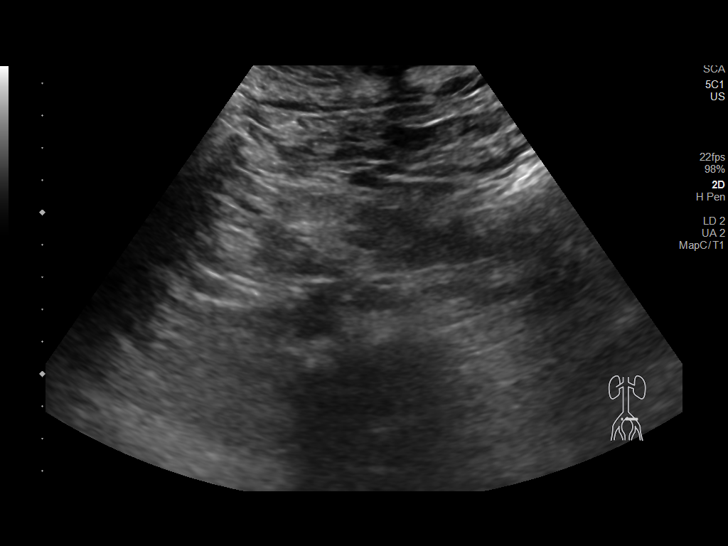

[14 of 25 positions shown; findings below may reference images not displayed]

FINDINGS: Gallbladder: No gallstones or wall thickening visualized. No
sonographic Murphy sign noted by sonographer.

Common bile duct: Diameter: 4 mm

Liver:

No focal lesion.

Diffusely increased parenchymal echogenicity.

Portal vein is patent on color Doppler imaging with normal direction
of blood flow towards the liver.

IVC: No abnormality visualized.

Pancreas: Visualized portion unremarkable.

Spleen: Size and appearance within normal limits.

Right Kidney: Length: 11.7 cm. Echogenicity within normal limits. No
mass or hydronephrosis visualized. 2.3 cm simple cyst present in the
upper pole.

Left Kidney: Length: 12.9 cm. Echogenicity within normal limits. No
mass or hydronephrosis visualized.

Abdominal aorta: No aneurysm visualized.

Other findings: None.
IMPRESSION: Diffuse increased echogenicity of the hepatic parenchyma is a
nonspecific indicator of hepatocellular dysfunction, most commonly
steatosis.

## 2023-05-25 ENCOUNTER — Encounter: Payer: Self-pay | Admitting: Family Medicine

## 2023-05-29 ENCOUNTER — Other Ambulatory Visit: Payer: Self-pay | Admitting: Family Medicine

## 2023-05-29 DIAGNOSIS — L729 Follicular cyst of the skin and subcutaneous tissue, unspecified: Secondary | ICD-10-CM

## 2023-06-14 ENCOUNTER — Ambulatory Visit: Payer: Self-pay | Admitting: Surgery

## 2023-06-14 NOTE — H&P (View-Only) (Signed)
 Zachary Rowe [id number]   Referring Provider:  Neda Balk, MD   Subjective   Chief Complaint: New Consultation (cysts)     History of Present Illness:    68 year old man with multiple medical problems including alcohol abuse in remission, arthritis, constipation, depression and anxiety, hypertension, hyperlipidemia, Peyronie's disease, Raynaud's disease, thyroid  disease, aortic atherosclerosis, coronary calcification on CT who presents for evaluation of 2 subcutaneous cysts, 1 on the back of his neck and 1 on his right posterior shoulder.  These have been present for several months.  No procedures in the past for either of these however he did have a sebaceous cyst removed from his neck at some point in the 80s.  The lesion on his neck spontaneously drained a couple weeks ago.  This is the first time this has happened.   Review of Systems: A complete review of systems was obtained from the patient.  I have reviewed this information and discussed as appropriate with the patient.  See HPI as well for other ROS.   Medical History: Past Medical History:  Diagnosis Date   GERD (gastroesophageal reflux disease)    Hypertension    Thyroid  disease     There is no problem list on file for this patient.   History reviewed. No pertinent surgical history.   No Known Allergies  Current Outpatient Medications on File Prior to Visit  Medication Sig Dispense Refill   amLODIPine  (NORVASC ) 10 MG tablet Take 10 mg by mouth once daily     atorvastatin  (LIPITOR) 20 MG tablet Take 20 mg by mouth once daily     folic acid  (FOLVITE ) 800 MCG tablet Take 800 mcg by mouth once daily     levothyroxine  (SYNTHROID ) 88 MCG tablet Take 88 mcg by mouth once daily     lisinopriL  (ZESTRIL ) 40 MG tablet Take 40 mg by mouth once daily     MELATONIN ORAL Take by mouth     thiamine 250 MG tablet Take 250 mg by mouth once daily     No current facility-administered medications on file prior to visit.     Family History  Problem Relation Age of Onset   High blood pressure (Hypertension) Mother    High blood pressure (Hypertension) Father    Skin cancer Brother      Social History   Tobacco Use  Smoking Status Former   Types: Cigarettes  Smokeless Tobacco Never     Social History   Socioeconomic History   Marital status: Married  Tobacco Use   Smoking status: Former    Types: Cigarettes   Smokeless tobacco: Never  Vaping Use   Vaping status: Never Used  Substance and Sexual Activity   Alcohol use: Yes    Alcohol/week: 4.0 - 14.0 standard drinks of alcohol    Types: 4 - 14 Standard drinks or equivalent per week   Drug use: Never   Social Drivers of Health   Housing Stability: Unknown (06/14/2023)   Housing Stability Vital Sign    Homeless in the Last Year: No    Objective:    Vitals:   06/14/23 0947  BP: 134/70  Pulse: 60  Temp: 36.8 C (98.2 F)  SpO2: 99%  Weight: 99.9 kg (220 lb 3.2 oz)  Height: 190.5 cm (6' 3)  PainSc: 0-No pain    Body mass index is 27.52 kg/m.  Gen: A&Ox3, no distress  Chest: respiratory effort is normal. Neuro: no gross deficit Psych: appropriate mood and affect, normal insight/judgment  intact  Skin: warm and dry.  Approximately 1 cm subcutaneous cyst with central pore on the right posterior neck.  Approximately 3 x 3 cm smooth mobile subcutaneous mass consistent with lipoma on right posterior shoulder.   Assessment and Plan:  Diagnoses and all orders for this visit:  Sebaceous cyst  Subcutaneous mass  Desires excision.  We discussed the procedure and risks of bleeding, infection, pain, scarring, injury to underlying structures, hematoma/seroma/poor wound healing or undesired cosmetic result, recurrence.  Questions welcomed and answered to satisfaction.  He wishes to proceed.    Aldon Hung MD FACS

## 2023-06-14 NOTE — H&P (Signed)
 Zachary Rowe [id number]   Referring Provider:  Neda Balk, MD   Subjective   Chief Complaint: New Consultation (cysts)     History of Present Illness:    68 year old man with multiple medical problems including alcohol abuse in remission, arthritis, constipation, depression and anxiety, hypertension, hyperlipidemia, Peyronie's disease, Raynaud's disease, thyroid  disease, aortic atherosclerosis, coronary calcification on CT who presents for evaluation of 2 subcutaneous cysts, 1 on the back of his neck and 1 on his right posterior shoulder.  These have been present for several months.  No procedures in the past for either of these however he did have a sebaceous cyst removed from his neck at some point in the 80s.  The lesion on his neck spontaneously drained a couple weeks ago.  This is the first time this has happened.   Review of Systems: A complete review of systems was obtained from the patient.  I have reviewed this information and discussed as appropriate with the patient.  See HPI as well for other ROS.   Medical History: Past Medical History:  Diagnosis Date   GERD (gastroesophageal reflux disease)    Hypertension    Thyroid  disease     There is no problem list on file for this patient.   History reviewed. No pertinent surgical history.   No Known Allergies  Current Outpatient Medications on File Prior to Visit  Medication Sig Dispense Refill   amLODIPine  (NORVASC ) 10 MG tablet Take 10 mg by mouth once daily     atorvastatin  (LIPITOR) 20 MG tablet Take 20 mg by mouth once daily     folic acid  (FOLVITE ) 800 MCG tablet Take 800 mcg by mouth once daily     levothyroxine  (SYNTHROID ) 88 MCG tablet Take 88 mcg by mouth once daily     lisinopriL  (ZESTRIL ) 40 MG tablet Take 40 mg by mouth once daily     MELATONIN ORAL Take by mouth     thiamine 250 MG tablet Take 250 mg by mouth once daily     No current facility-administered medications on file prior to visit.     Family History  Problem Relation Age of Onset   High blood pressure (Hypertension) Mother    High blood pressure (Hypertension) Father    Skin cancer Brother      Social History   Tobacco Use  Smoking Status Former   Types: Cigarettes  Smokeless Tobacco Never     Social History   Socioeconomic History   Marital status: Married  Tobacco Use   Smoking status: Former    Types: Cigarettes   Smokeless tobacco: Never  Vaping Use   Vaping status: Never Used  Substance and Sexual Activity   Alcohol use: Yes    Alcohol/week: 4.0 - 14.0 standard drinks of alcohol    Types: 4 - 14 Standard drinks or equivalent per week   Drug use: Never   Social Drivers of Health   Housing Stability: Unknown (06/14/2023)   Housing Stability Vital Sign    Homeless in the Last Year: No    Objective:    Vitals:   06/14/23 0947  BP: 134/70  Pulse: 60  Temp: 36.8 C (98.2 F)  SpO2: 99%  Weight: 99.9 kg (220 lb 3.2 oz)  Height: 190.5 cm (6' 3)  PainSc: 0-No pain    Body mass index is 27.52 kg/m.  Gen: A&Ox3, no distress  Chest: respiratory effort is normal. Neuro: no gross deficit Psych: appropriate mood and affect, normal insight/judgment  intact  Skin: warm and dry.  Approximately 1 cm subcutaneous cyst with central pore on the right posterior neck.  Approximately 3 x 3 cm smooth mobile subcutaneous mass consistent with lipoma on right posterior shoulder.   Assessment and Plan:  Diagnoses and all orders for this visit:  Sebaceous cyst  Subcutaneous mass  Desires excision.  We discussed the procedure and risks of bleeding, infection, pain, scarring, injury to underlying structures, hematoma/seroma/poor wound healing or undesired cosmetic result, recurrence.  Questions welcomed and answered to satisfaction.  He wishes to proceed.    Aldon Hung MD FACS

## 2023-06-20 NOTE — Progress Notes (Addendum)
 COVID Vaccine received:  []  No [x]  Yes Date of any COVID positive Test in last 90 days:  PCP -  Randie Bustle, MD  Cardiologist - Sheryle Donning, MD, Palmer Bobo, NP  05-10-23 Epic note  Chest x-ray -  EKG - 05-10-2023  Epic  Stress Test -  ECHO - 11-07-2010  Epic Cardiac Cath -  CT Coronary Calcium  score:  ZIO Monitor- 07-04-2022  Epic  Pacemaker / ICD device [x]  No []  Yes   Spinal Cord Stimulator:[x]  No []  Yes       History of Sleep Apnea? [x]  No []  Yes   CPAP used?- [x]  No []  Yes    Does the patient monitor blood sugar?   [x]  N/A   []  No []  Yes  Patient has: [x]  NO Hx DM   []  Pre-DM   []  DM1  []   DM2 Last A1c was:  5.7 on  07-19-2020     Blood Thinner / Instructions: none Aspirin Instructions:  none  ERAS Protocol Ordered: [x]  No  []  Yes Patient is to be NPO after: MN prior  Dental hx: []  Dentures:  []  N/A      []  Bridge or Partial:                   []  Loose or Damaged teeth:   Comments:   Activity level: Able to walk up 2 flights of stairs without becoming significantly short of breath or having chest pain?  []  No   []    Yes   Anesthesia review: CAD, near syncope- bradycardia, HTN, GERD, Raynaud's, Bilateral carotid stenosis, Remote ETOH- ?LFTs, HOH-HAs, depression  Patient denies shortness of breath, fever, cough and chest pain at PAT appointment.  Patient verbalized understanding and agreement to the Pre-Surgical Instructions that were given to them at this PAT appointment. Patient was also educated of the need to review these PAT instructions again prior to his surgery.I reviewed the appropriate phone numbers to call if they have any and questions or concerns.

## 2023-06-20 NOTE — Patient Instructions (Signed)
 SURGICAL WAITING ROOM VISITATION Patients having surgery or a procedure may have no more than 2 support people in the waiting area - these visitors may rotate in the visitor waiting room.   If the patient needs to stay at the hospital during part of their recovery, the visitor guidelines for inpatient rooms apply.  PRE-OP VISITATION  Pre-op nurse will coordinate an appropriate time for 1 support person to accompany the patient in pre-op.  This support person may not rotate.  This visitor will be contacted when the time is appropriate for the visitor to come back in the pre-op area.  Please refer to the [facility] website for the visitor guidelines for Inpatients (after your surgery is over and you are in a regular room).  You are not required to quarantine at this time prior to your surgery. However, you must do this: Hand Hygiene often Do NOT share personal items Notify your provider if you are in close contact with someone who has COVID or you develop fever 100.4 or greater, new onset of sneezing, cough, sore throat, shortness of breath or body aches.  If you test positive for Covid or have been in contact with anyone that has tested positive in the last 10 days please notify you surgeon.    Your procedure is scheduled on:  MONDAY  June 25, 2023  Report to [facility] Main Entrance: [address] entrance where the [employer] is available.   Report to admitting at:  11:15 AM  Call this number if you have any questions or problems the morning of surgery [phone]  DO NOT EAT OR DRINK ANYTHING AFTER MIDNIGHT THE NIGHT PRIOR TO YOUR SURGERY / PROCEDURE.   FOLLOW  ANY ADDITIONAL PRE OP INSTRUCTIONS YOU RECEIVED FROM YOUR SURGEON'S OFFICE!!!   Oral Hygiene is also important to reduce your risk of infection.        Remember - BRUSH YOUR TEETH THE MORNING OF SURGERY WITH YOUR REGULAR TOOTHPASTE  Do NOT smoke after Midnight the night before surgery.  STOP TAKING all Vitamins,  Herbs and supplements 1 week before your surgery.   Take ONLY these medicines the morning of surgery with A SIP OF WATER: levothyroxine , omeprazole, amlodipine  and you may use your nasal spray if needed.                   You may not have any metal on your body including jewelry, and body piercing  Do not wear  lotions, powders,  cologne, or deodorant  Men may shave face and neck.  Contacts, Hearing Aids, dentures or bridgework may not be worn into surgery. DENTURES WILL BE REMOVED PRIOR TO SURGERY PLEASE DO NOT APPLY Poly grip OR ADHESIVES!!!  Patients discharged on the day of surgery will not be allowed to drive home.  Someone NEEDS to stay with you for the first 24 hours after anesthesia.  Do not bring your home medications to the hospital. The Pharmacy will dispense medications listed on your medication list to you during your admission in the Hospital.  Please read over the following fact sheets you were given: IF YOU HAVE QUESTIONS ABOUT YOUR PRE-OP INSTRUCTIONS, PLEASE CALL [phone].   [facility] - Preparing for Surgery Before surgery, you can play an important role.  Because skin is not sterile, your skin needs to be as free of germs as possible.  You can reduce the number of germs on your skin by washing with CHG (chlorahexidine gluconate) soap before surgery.  CHG is an antiseptic cleaner which kills germs and bonds with the skin to continue killing germs even after washing. Please DO NOT use if you have an allergy  to CHG or antibacterial soaps.  If your skin becomes reddened/irritated stop using the CHG and inform your nurse when you arrive at Short Stay. Do not shave (including legs and underarms) for at least 48 hours prior to the first CHG shower.  You may shave your face/neck.  Please follow these instructions carefully:  1.  Shower with CHG Soap the night before surgery and the  morning of surgery.  2.  If you choose to wash your hair, wash your hair first as usual  with your normal  shampoo.  3.  After you shampoo, rinse your hair and body thoroughly to remove the shampoo.                             4.  Use CHG as you would any other liquid soap.  You can apply chg directly to the skin and wash.  Gently with a scrungie or clean washcloth.  5.  Apply the CHG Soap to your body ONLY FROM THE NECK DOWN.   Do not use on face/ open                           Wound or open sores. Avoid contact with eyes, ears mouth and genitals (private parts).                       Wash face,  Genitals (private parts) with your normal soap.             6.  Wash thoroughly, paying special attention to the area where your  surgery  will be performed.  7.  Thoroughly rinse your body with warm water from the neck down.  8.  DO NOT shower/wash with your normal soap after using and rinsing off the CHG Soap.            9.  Pat yourself dry with a clean towel.            10.  Wear clean pajamas.            11.  Place clean sheets on your bed the night of your first shower and do not  sleep with pets.  ON THE DAY OF SURGERY : Do not apply any lotions/deodorants the morning of surgery.  Please wear clean clothes to the hospital/surgery center.    FAILURE TO FOLLOW THESE INSTRUCTIONS MAY RESULT IN THE CANCELLATION OF YOUR SURGERY  PATIENT SIGNATURE_________________________________  NURSE SIGNATURE__________________________________  ________________________________________________________________________

## 2023-06-21 ENCOUNTER — Other Ambulatory Visit: Payer: Self-pay

## 2023-06-21 ENCOUNTER — Encounter (HOSPITAL_COMMUNITY)
Admission: RE | Admit: 2023-06-21 | Discharge: 2023-06-21 | Disposition: A | Discharge status: Home / Self Care | Source: Ambulatory Visit | Attending: Surgery | Admitting: Surgery

## 2023-06-21 ENCOUNTER — Encounter (HOSPITAL_COMMUNITY): Payer: Self-pay

## 2023-06-21 VITALS — BP 114/70 | Temp 98.5°F | Resp 12 | Ht 75.0 in | Wt 216.0 lb

## 2023-06-21 DIAGNOSIS — E039 Hypothyroidism, unspecified: Secondary | ICD-10-CM | POA: Diagnosis not present

## 2023-06-21 DIAGNOSIS — I129 Hypertensive chronic kidney disease with stage 1 through stage 4 chronic kidney disease, or unspecified chronic kidney disease: Secondary | ICD-10-CM | POA: Insufficient documentation

## 2023-06-21 DIAGNOSIS — R7989 Other specified abnormal findings of blood chemistry: Secondary | ICD-10-CM | POA: Insufficient documentation

## 2023-06-21 DIAGNOSIS — Z01812 Encounter for preprocedural laboratory examination: Secondary | ICD-10-CM | POA: Insufficient documentation

## 2023-06-21 DIAGNOSIS — I10 Essential (primary) hypertension: Secondary | ICD-10-CM

## 2023-06-21 DIAGNOSIS — Z87891 Personal history of nicotine dependence: Secondary | ICD-10-CM | POA: Insufficient documentation

## 2023-06-21 DIAGNOSIS — I700 Atherosclerosis of aorta: Secondary | ICD-10-CM | POA: Insufficient documentation

## 2023-06-21 DIAGNOSIS — R209 Unspecified disturbances of skin sensation: Secondary | ICD-10-CM

## 2023-06-21 DIAGNOSIS — I2510 Atherosclerotic heart disease of native coronary artery without angina pectoris: Secondary | ICD-10-CM | POA: Diagnosis not present

## 2023-06-21 DIAGNOSIS — R229 Localized swelling, mass and lump, unspecified: Secondary | ICD-10-CM | POA: Insufficient documentation

## 2023-06-21 DIAGNOSIS — F1011 Alcohol abuse, in remission: Secondary | ICD-10-CM | POA: Diagnosis not present

## 2023-06-21 DIAGNOSIS — Z01818 Encounter for other preprocedural examination: Secondary | ICD-10-CM

## 2023-06-21 DIAGNOSIS — N189 Chronic kidney disease, unspecified: Secondary | ICD-10-CM | POA: Diagnosis not present

## 2023-06-21 HISTORY — DX: Gastro-esophageal reflux disease without esophagitis: K21.9

## 2023-06-21 HISTORY — DX: Hypothyroidism, unspecified: E03.9

## 2023-06-21 HISTORY — DX: Cardiac arrhythmia, unspecified: I49.9

## 2023-06-21 HISTORY — DX: Anxiety disorder, unspecified: F41.9

## 2023-06-21 HISTORY — DX: Atherosclerotic heart disease of native coronary artery without angina pectoris: I25.10

## 2023-06-21 HISTORY — DX: Syncope and collapse: R55

## 2023-06-21 HISTORY — DX: Myoneural disorder, unspecified: G70.9

## 2023-06-21 HISTORY — DX: Abnormal levels of other serum enzymes: R74.8

## 2023-06-21 HISTORY — DX: Alcohol abuse, uncomplicated: F10.10

## 2023-06-21 LAB — COMPREHENSIVE METABOLIC PANEL WITH GFR
ALT: 50 U/L — ABNORMAL HIGH (ref 0–44)
AST: 56 U/L — ABNORMAL HIGH (ref 15–41)
Albumin: 4.5 g/dL (ref 3.5–5.0)
Alkaline Phosphatase: 48 U/L (ref 38–126)
Anion gap: 7 (ref 5–15)
BUN: 24 mg/dL — ABNORMAL HIGH (ref 8–23)
CO2: 26 mmol/L (ref 22–32)
Calcium: 9.2 mg/dL (ref 8.9–10.3)
Chloride: 106 mmol/L (ref 98–111)
Creatinine, Ser: 1.53 mg/dL — ABNORMAL HIGH (ref 0.61–1.24)
GFR, Estimated: 49 mL/min — ABNORMAL LOW (ref >60)
Glucose, Bld: 113 mg/dL — ABNORMAL HIGH (ref 70–99)
Potassium: 4.2 mmol/L (ref 3.5–5.1)
Sodium: 139 mmol/L (ref 135–145)
Total Bilirubin: 1.1 mg/dL (ref 0.0–1.2)
Total Protein: 7.4 g/dL (ref 6.5–8.1)

## 2023-06-21 LAB — CBC
HCT: 42.2 % (ref 39.0–52.0)
Hemoglobin: 14 g/dL (ref 13.0–17.0)
MCH: 33.7 pg (ref 26.0–34.0)
MCHC: 33.2 g/dL (ref 30.0–36.0)
MCV: 101.7 fL — ABNORMAL HIGH (ref 80.0–100.0)
Platelets: 264 10*3/uL (ref 150–400)
RBC: 4.15 MIL/uL — ABNORMAL LOW (ref 4.22–5.81)
RDW: 12.9 % (ref 11.5–15.5)
WBC: 5.9 10*3/uL (ref 4.0–10.5)
nRBC: 0 % (ref 0.0–0.2)

## 2023-06-22 ENCOUNTER — Encounter (HOSPITAL_COMMUNITY): Payer: Self-pay

## 2023-06-22 NOTE — Anesthesia Preprocedure Evaluation (Signed)
 Anesthesia Evaluation  Patient identified by MRN, date of birth, ID band Patient awake    Reviewed: Allergy  & Precautions, NPO status , Patient's Chart, lab work & pertinent test results  History of Anesthesia Complications Negative for: history of anesthetic complications  Airway Mallampati: I  TM Distance: >3 FB Neck ROM: Full    Dental  (+) Dental Advisory Given   Pulmonary former smoker   breath sounds clear to auscultation       Cardiovascular hypertension, Pt. on medications (-) angina  Rhythm:Regular Rate:Normal  '18 Stress test: normal, low risk   Neuro/Psych   Anxiety Depression       GI/Hepatic Neg liver ROS,GERD  Medicated and Controlled,,  Endo/Other  Hypothyroidism    Renal/GU Renal InsufficiencyRenal disease     Musculoskeletal  (+) Arthritis ,    Abdominal   Peds  Hematology negative hematology ROS (+) Hb 14.0, plt 264k   Anesthesia Other Findings   Reproductive/Obstetrics                             Anesthesia Physical Anesthesia Plan  ASA: 2  Anesthesia Plan: MAC   Post-op Pain Management: Tylenol PO (pre-op)*   Induction:   PONV Risk Score and Plan: 1 and Treatment may vary due to age or medical condition  Airway Management Planned: Natural Airway and Simple Face Mask  Additional Equipment:   Intra-op Plan:   Post-operative Plan:   Informed Consent: I have reviewed the patients History and Physical, chart, labs and discussed the procedure including the risks, benefits and alternatives for the proposed anesthesia with the patient or authorized representative who has indicated his/her understanding and acceptance.     Dental advisory given  Plan Discussed with: CRNA and Surgeon  Anesthesia Plan Comments: (See PAT note from 6/19)        Anesthesia Quick Evaluation

## 2023-06-22 NOTE — Progress Notes (Addendum)
 Case: [id number] Date/Time: 06/25/23 1320   Procedures:      EXCISION, MASS, UPPER EXTREMITY (Right) - EXCISION OF SUBCUTANEOUS MASS RIGHT SHOULDER     EXCISION, MASS, NECK - EXCISON OF SUBCUTANEOUS CYST POSTERIOR NECK   Anesthesia type: Monitor Anesthesia Care   Diagnosis: Subcutaneous mass [R22.9]   Pre-op diagnosis: SUBCUTANEOUS MASS   Location: [facility] ROOM 01 / [facility]   Surgeons: Adalberto Acton, MD       DISCUSSION: Zachary Rowe is a 68 yo male who presents to PAT prior to surgery above. PMH of former smoking, HTN, CAD/aortic atherosclerosis (by CT), bradycardia, GERD, hypothyroid, CKD, hx of ETOH abuse, anxiety, arthritis.  Patient follows with Cardiology for coronary calcification and aortic atherosclerosis on CT as well as hx of near syncope. Near syncope was thought to be related to orthostasis and blood pressure medicines were reduced. Cardiac monitoring was unrevealing other than brief SVT. No echo. Last seen in clinic on 05/10/23. He reported being very active. Has had several episodes of dizziness which have been brief. He has baseline bradycardia in the 40s. He was advised if syncope recurs then he should be referred to EP. Advised avoid AV nodal agents  VS: BP 114/70   Temp 36.9 C (Oral)   Resp 12   Ht 6' 3 (1.905 m)   Wt 98 kg   SpO2 100%   BMI 27.00 kg/m   PROVIDERS: Neda Balk, MD   LABS: Labs reviewed: Acceptable for surgery. Kidney function stable (all labs ordered are listed, but only abnormal results are displayed)  Labs Reviewed  COMPREHENSIVE METABOLIC PANEL WITH GFR - Abnormal; Notable for the following components:      Result Value   Glucose, Bld 113 (*)    BUN 24 (*)    Creatinine, Ser 1.53 (*)    AST 56 (*)    ALT 50 (*)    GFR, Estimated 49 (*)    All other components within normal limits  CBC - Abnormal; Notable for the following components:   RBC 4.15 (*)    MCV 101.7 (*)    All other components within normal limits      IMAGES:   EKG 05/10/23:  Sinus bradycardia, rate 42 Septal infarct , age undetermined  CV: Cardiac monitor 07/04/22:  Patch Wear Time:  12 days and 19 hours (2024-06-13T11:40:46-0400 to 2024-06-26T07:16:03-398)   Patient had a min HR of 37 bpm, max HR of 152 bpm, and avg HR of 57 bpm. Predominant underlying rhythm was Sinus Rhythm. First Degree AV Block was present. 4 Supraventricular Tachycardia runs occurred, the run with the fastest interval lasting 5 beats with a max rate of 152 bpm, the longest lasting 8 beats with an avg rate of 90 bpm. No VT, atrial fibrillation, high degree block, or pauses noted. Isolated atrial and ventricular ectopy was rare (<1%). There were 24 triggered events, which were sinus. No high risk arrhythmias detected.   Stress test 06/22/2016:  Blood pressure demonstrated a normal response to exercise. There was no ST segment deviation noted during stress. Normal exercise stress test with no EKG criteria of ischemia. The patient exercised for 11.8 mets achieving 86% MPHR. He has no symtpoms. This is a low risk study.   Past Medical History:  Diagnosis Date   Anxiety    Arthritis    Back pain 04/02/2014   Bilateral carotid bruits 11/07/2016   Bradycardia 08/06/2016   Chicken pox as a child   Chronic sinusitis 01/03/2000  Constipation 08/06/2016   Coronary artery disease    Dysrhythmia    bradycardia   Elevated liver enzymes    ETOH abuse    has a couple of shots per day,  was in remission but started back drinking   Family history of liver cancer    Family history of lung cancer    Family history of prostate cancer    Fatigue 11/01/2010   GERD (gastroesophageal reflux disease)    Hard of hearing    wears bilateral hearing aids   Hyperglycemia 08/04/2016   Hyperlipidemia    Hypertension    Hypothyroidism    Measles as a child   Near syncope    several times   Overweight(278.02) 09/02/2010   Personal history of colonic polyps - adenoma  04/17/2007   04/2007 - 3 sigmoid polyps - at least 1 tubular adenoma   Peyronie disease 10/14/2012   Raynaud disease 04/11/2011    Past Surgical History:  Procedure Laterality Date   COLONOSCOPY  03/06/2013   x2 repeated 2020   NO PAST SURGERIES     SKIN BIOPSY     BB in forehead    MEDICATIONS:  amLODipine  (NORVASC ) 10 MG tablet   atorvastatin  (LIPITOR) 20 MG tablet   Azelastine  HCl 0.15 % SOLN   folic acid  (FOLVITE ) 800 MCG tablet   levothyroxine  (SYNTHROID ) 88 MCG tablet   lisinopril  (ZESTRIL ) 40 MG tablet   MELATONIN PO   omeprazole (PRILOSEC) 20 MG capsule   tadalafil  (CIALIS ) 5 MG tablet   thiamine 250 MG tablet   No current facility-administered medications for this encounter.    Antoinette Kirschner [facility] Surgical Short Stay/Anesthesiology [facility] Phone [phone] 06/22/2023 9:36 AM

## 2023-06-25 ENCOUNTER — Encounter (HOSPITAL_COMMUNITY)
Admission: RE | Disposition: A | Discharge status: Home / Self Care | Payer: Self-pay | Source: Home / Self Care | Attending: Surgery

## 2023-06-25 ENCOUNTER — Other Ambulatory Visit: Payer: Self-pay

## 2023-06-25 ENCOUNTER — Ambulatory Visit (HOSPITAL_COMMUNITY): Payer: Self-pay | Admitting: Medical

## 2023-06-25 ENCOUNTER — Ambulatory Visit (HOSPITAL_COMMUNITY)
Admission: RE | Admit: 2023-06-25 | Discharge: 2023-06-25 | Disposition: A | Discharge status: Home / Self Care | Attending: Surgery | Admitting: Surgery

## 2023-06-25 ENCOUNTER — Encounter (HOSPITAL_COMMUNITY): Payer: Self-pay | Admitting: Surgery

## 2023-06-25 ENCOUNTER — Other Ambulatory Visit (HOSPITAL_COMMUNITY): Payer: Self-pay

## 2023-06-25 ENCOUNTER — Ambulatory Visit (HOSPITAL_BASED_OUTPATIENT_CLINIC_OR_DEPARTMENT_OTHER): Payer: Self-pay

## 2023-06-25 DIAGNOSIS — I2510 Atherosclerotic heart disease of native coronary artery without angina pectoris: Secondary | ICD-10-CM | POA: Diagnosis not present

## 2023-06-25 DIAGNOSIS — R229 Localized swelling, mass and lump, unspecified: Secondary | ICD-10-CM | POA: Diagnosis present

## 2023-06-25 DIAGNOSIS — F1011 Alcohol abuse, in remission: Secondary | ICD-10-CM | POA: Diagnosis not present

## 2023-06-25 DIAGNOSIS — Z87891 Personal history of nicotine dependence: Secondary | ICD-10-CM | POA: Insufficient documentation

## 2023-06-25 DIAGNOSIS — N486 Induration penis plastica: Secondary | ICD-10-CM | POA: Diagnosis not present

## 2023-06-25 DIAGNOSIS — E785 Hyperlipidemia, unspecified: Secondary | ICD-10-CM | POA: Diagnosis not present

## 2023-06-25 DIAGNOSIS — D1721 Benign lipomatous neoplasm of skin and subcutaneous tissue of right arm: Secondary | ICD-10-CM | POA: Insufficient documentation

## 2023-06-25 DIAGNOSIS — E039 Hypothyroidism, unspecified: Secondary | ICD-10-CM | POA: Diagnosis not present

## 2023-06-25 DIAGNOSIS — I7300 Raynaud's syndrome without gangrene: Secondary | ICD-10-CM | POA: Diagnosis not present

## 2023-06-25 DIAGNOSIS — I10 Essential (primary) hypertension: Secondary | ICD-10-CM | POA: Diagnosis not present

## 2023-06-25 DIAGNOSIS — K219 Gastro-esophageal reflux disease without esophagitis: Secondary | ICD-10-CM | POA: Diagnosis not present

## 2023-06-25 DIAGNOSIS — Z79899 Other long term (current) drug therapy: Secondary | ICD-10-CM | POA: Diagnosis not present

## 2023-06-25 DIAGNOSIS — L723 Sebaceous cyst: Secondary | ICD-10-CM | POA: Insufficient documentation

## 2023-06-25 DIAGNOSIS — D171 Benign lipomatous neoplasm of skin and subcutaneous tissue of trunk: Secondary | ICD-10-CM | POA: Insufficient documentation

## 2023-06-25 DIAGNOSIS — M1990 Unspecified osteoarthritis, unspecified site: Secondary | ICD-10-CM | POA: Diagnosis not present

## 2023-06-25 HISTORY — PX: EXCISION, MASS, UPPER EXTREMITY: SHX7567

## 2023-06-25 HISTORY — PX: EXCISION MASS NECK: SHX6703

## 2023-06-25 SURGERY — EXCISION, MASS, UPPER EXTREMITY
Anesthesia: Monitor Anesthesia Care | Laterality: Right

## 2023-06-25 MED ORDER — PROPOFOL 1000 MG/100ML IV EMUL
INTRAVENOUS | Status: AC
  Filled 2023-06-25: qty 100

## 2023-06-25 MED ORDER — ONDANSETRON HCL 4 MG/2ML IJ SOLN
INTRAMUSCULAR | Status: DC | PRN
Start: 2023-06-25 — End: 2023-06-25
  Administered 2023-06-25: 4 mg via INTRAVENOUS

## 2023-06-25 MED ORDER — MIDAZOLAM HCL 2 MG/2ML IJ SOLN
INTRAMUSCULAR | Status: AC
  Filled 2023-06-25: qty 2

## 2023-06-25 MED ORDER — OXYCODONE HCL 5 MG PO TABS: 5.0000 mg | ORAL_TABLET | Freq: Once | ORAL | Status: DC | PRN

## 2023-06-25 MED ORDER — MIDAZOLAM HCL 2 MG/2ML IJ SOLN: 0.5000 mg | Freq: Once | INTRAMUSCULAR | Status: DC | PRN

## 2023-06-25 MED ORDER — FENTANYL CITRATE PF 50 MCG/ML IJ SOSY: 25.0000 ug | PREFILLED_SYRINGE | INTRAMUSCULAR | Status: DC | PRN

## 2023-06-25 MED ORDER — CHLORHEXIDINE GLUCONATE 4 % EX SOLN: 60.0000 mL | Freq: Once | CUTANEOUS | Status: DC

## 2023-06-25 MED ORDER — EPHEDRINE 5 MG/ML INJ
INTRAVENOUS | Status: AC
  Filled 2023-06-25: qty 5

## 2023-06-25 MED ORDER — BUPIVACAINE-EPINEPHRINE (PF) 0.25% -1:200000 IJ SOLN
INTRAMUSCULAR | Status: AC
  Filled 2023-06-25: qty 30

## 2023-06-25 MED ORDER — OXYCODONE HCL 5 MG/5ML PO SOLN: 5.0000 mg | Freq: Once | ORAL | Status: DC | PRN

## 2023-06-25 MED ORDER — MIDAZOLAM HCL 5 MG/5ML IJ SOLN
INTRAMUSCULAR | Status: DC | PRN
  Administered 2023-06-25: 2 mg via INTRAVENOUS

## 2023-06-25 MED ORDER — CHLORHEXIDINE GLUCONATE 0.12 % MT SOLN
15.0000 mL | Freq: Once | OROMUCOSAL | Status: AC
  Administered 2023-06-25: 15 mL via OROMUCOSAL

## 2023-06-25 MED ORDER — CEFAZOLIN SODIUM-DEXTROSE 2-4 GM/100ML-% IV SOLN
2.0000 g | INTRAVENOUS | Status: AC
  Administered 2023-06-25: 2 g via INTRAVENOUS
  Filled 2023-06-25: qty 100

## 2023-06-25 MED ORDER — ORAL CARE MOUTH RINSE: 15.0000 mL | Freq: Once | OROMUCOSAL | Status: AC

## 2023-06-25 MED ORDER — TRAMADOL HCL 50 MG PO TABS
50.0000 mg | ORAL_TABLET | Freq: Four times a day (QID) | ORAL | 0 refills | Status: AC | PRN
  Filled 2023-06-25: qty 10, 3d supply, fill #0

## 2023-06-25 MED ORDER — DOCUSATE SODIUM 100 MG PO CAPS
100.0000 mg | ORAL_CAPSULE | Freq: Two times a day (BID) | ORAL | 0 refills | Status: AC
  Filled 2023-06-25: qty 30, 15d supply, fill #0

## 2023-06-25 MED ORDER — FENTANYL CITRATE (PF) 100 MCG/2ML IJ SOLN
INTRAMUSCULAR | Status: AC
  Filled 2023-06-25: qty 2

## 2023-06-25 MED ORDER — ACETAMINOPHEN 500 MG PO TABS
1000.0000 mg | ORAL_TABLET | ORAL | Status: AC
  Administered 2023-06-25: 1000 mg via ORAL
  Filled 2023-06-25: qty 2

## 2023-06-25 MED ORDER — EPHEDRINE SULFATE-NACL 50-0.9 MG/10ML-% IV SOSY
PREFILLED_SYRINGE | INTRAVENOUS | Status: DC | PRN
  Administered 2023-06-25 (×3): 5 mg via INTRAVENOUS

## 2023-06-25 MED ORDER — LIDOCAINE HCL (PF) 2 % IJ SOLN
INTRAMUSCULAR | Status: DC | PRN
  Administered 2023-06-25: 40 mg via INTRADERMAL

## 2023-06-25 MED ORDER — FENTANYL CITRATE (PF) 100 MCG/2ML IJ SOLN
INTRAMUSCULAR | Status: DC | PRN
  Administered 2023-06-25 (×4): 25 ug via INTRAVENOUS

## 2023-06-25 MED ORDER — ACETAMINOPHEN 500 MG PO TABS: 1000.0000 mg | ORAL_TABLET | Freq: Once | ORAL | Status: DC

## 2023-06-25 MED ORDER — LACTATED RINGERS IV SOLN: INTRAVENOUS | Status: DC

## 2023-06-25 MED ORDER — STERILE WATER FOR IRRIGATION IR SOLN
Status: DC | PRN
  Administered 2023-06-25: 1000 mL

## 2023-06-25 MED ORDER — PROPOFOL 500 MG/50ML IV EMUL
INTRAVENOUS | Status: DC | PRN
  Administered 2023-06-25: 120 ug/kg/min via INTRAVENOUS

## 2023-06-25 MED ORDER — BUPIVACAINE-EPINEPHRINE 0.25% -1:200000 IJ SOLN
INTRAMUSCULAR | Status: DC | PRN
  Administered 2023-06-25: 10 mL

## 2023-06-25 SURGICAL SUPPLY — 25 items
BAG COUNTER SPONGE SURGICOUNT (BAG) IMPLANT
BENZOIN TINCTURE PRP APPL 2/3 (GAUZE/BANDAGES/DRESSINGS) IMPLANT
COVER SURGICAL LIGHT HANDLE (MISCELLANEOUS) ×2 IMPLANT
DERMABOND ADVANCED .7 DNX12 (GAUZE/BANDAGES/DRESSINGS) IMPLANT
DRAPE LAPAROSCOPIC ABDOMINAL (DRAPES) IMPLANT
DRAPE LAPAROTOMY T 102X78X121 (DRAPES) IMPLANT
DRAPE LAPAROTOMY TRNSV 102X78 (DRAPES) IMPLANT
ELECT REM PT RETURN 15FT ADLT (MISCELLANEOUS) ×2 IMPLANT
GAUZE SPONGE 4X4 12PLY STRL (GAUZE/BANDAGES/DRESSINGS) ×2 IMPLANT
GLOVE BIO SURGEON STRL SZ 6 (GLOVE) ×2 IMPLANT
GLOVE INDICATOR 6.5 STRL GRN (GLOVE) ×2 IMPLANT
GOWN STRL REUS W/ TWL LRG LVL3 (GOWN DISPOSABLE) ×2 IMPLANT
KIT BASIN OR (CUSTOM PROCEDURE TRAY) ×2 IMPLANT
KIT TURNOVER KIT A (KITS) ×2 IMPLANT
MARKER SKIN DUAL TIP RULER LAB (MISCELLANEOUS) IMPLANT
NDL HYPO 22X1.5 SAFETY MO (MISCELLANEOUS) ×2 IMPLANT
NEEDLE HYPO 22X1.5 SAFETY MO (MISCELLANEOUS) ×2 IMPLANT
PACK GENERAL/GYN (CUSTOM PROCEDURE TRAY) ×2 IMPLANT
SPIKE FLUID TRANSFER (MISCELLANEOUS) IMPLANT
STRIP CLOSURE SKIN 1/2X4 (GAUZE/BANDAGES/DRESSINGS) IMPLANT
SUT MNCRL AB 4-0 PS2 18 (SUTURE) ×2 IMPLANT
SUT VIC AB 3-0 SH 27XBRD (SUTURE) ×2 IMPLANT
SYR CONTROL 10ML LL (SYRINGE) ×2 IMPLANT
TAPE CLOTH SURG 4X10 WHT LF (GAUZE/BANDAGES/DRESSINGS) IMPLANT
TOWEL OR 17X26 10 PK STRL BLUE (TOWEL DISPOSABLE) ×2 IMPLANT

## 2023-06-25 NOTE — Transfer of Care (Signed)
 Immediate Anesthesia Transfer of Care Note  Patient: Zachary Rowe  Procedure(s) Performed: EXCISION, MASS, UPPER EXTREMITY (Right) EXCISION, MASS, NECK  Patient Location: PACU  Anesthesia Type:MAC  Level of Consciousness: awake  Airway & Oxygen Therapy: Patient Spontanous Breathing  Post-op Assessment: Report given to RN and Post -op Vital signs reviewed and stable  Post vital signs: Reviewed and stable  Last Vitals:  Vitals Value Taken Time  BP    Temp    Pulse 63 06/25/23 13:15  Resp 15 06/25/23 13:15  SpO2 100 % 06/25/23 13:15  Vitals shown include unfiled device data.  Last Pain:  Vitals:   06/25/23 1100  TempSrc: Oral         Complications: No notable events documented.

## 2023-06-25 NOTE — Anesthesia Postprocedure Evaluation (Signed)
 Anesthesia Post Note  Patient: Zachary Rowe  Procedure(s) Performed: EXCISION, MASS, UPPER EXTREMITY (Right) EXCISION, MASS, NECK     Patient location during evaluation: PACU Anesthesia Type: MAC Level of consciousness: awake and alert, oriented and patient cooperative Pain management: pain level controlled Vital Signs Assessment: post-procedure vital signs reviewed and stable Respiratory status: spontaneous breathing, nonlabored ventilation and respiratory function stable Cardiovascular status: stable and blood pressure returned to baseline Postop Assessment: no apparent nausea or vomiting and able to ambulate Anesthetic complications: no  No notable events documented.  Last Vitals:  Vitals:   06/25/23 1345 06/25/23 1354  BP: (!) 150/71   Pulse: (!) 59   Resp: (!) 23 20  Temp: 36.7 C 36.6 C  SpO2: 94%     Last Pain:  Vitals:   06/25/23 1345  TempSrc:   PainSc: 0-No pain                 Smt Lokey,E. Denell Cothern

## 2023-06-25 NOTE — Interval H&P Note (Signed)
 History and Physical Interval Note:  06/25/2023 10:56 AM  Zachary Rowe  has presented today for surgery, with the diagnosis of SUBCUTANEOUS MASS.  The various methods of treatment have been discussed with the patient and family. After consideration of risks, benefits and other options for treatment, the patient has consented to  Procedure(s) with comments: EXCISION, MASS, UPPER EXTREMITY (Right) - EXCISION OF SUBCUTANEOUS MASS RIGHT SHOULDER EXCISION, MASS, NECK (N/A) - EXCISON OF SUBCUTANEOUS CYST POSTERIOR NECK as a surgical intervention.  The patient's history has been reviewed, patient examined, no change in status, stable for surgery.  I have reviewed the patient's chart and labs.  Questions were answered to the patient's satisfaction.     Toshiro Hanken DELENA Freund

## 2023-06-25 NOTE — Op Note (Signed)
 Operative Note  Zachary Rowe  [id number]  [id number]  06/25/2023   Surgeon: Mitzie Freund MD FACS   Procedure performed: Excision of subcutaneous mass 3x3x3cm, right posterior shoulder; excision of sebaceous cyst, right posterior neck, 1 cm x 1 cm x 1 cm.   Preop diagnosis: Subcutaneous cyst on the right posterior neck, subcutaneous mass in the right posterior shoulder Post-op diagnosis/intraop findings: same   Specimens: Right posterior shoulder mass Retained items: no  EBL: minimal cc Complications: none   Description of procedure: After obtaining informed consent the patient was taken to the operating room and placed in left lateral decubitus position on the operating room table where MAC was initiated, preoperative antibiotics were administered, SCDs applied, and a formal timeout was performed.  The neck and right shoulder were prepped and draped in usual sterile fashion.  After infiltration with local, an elliptical incision was made over the palpable cyst on the right posterior neck.  Cautery was then used to dissect around the cyst which was then entirely excised and discarded.  Hemostasis was ensured within the wound.  This was closed with a deep dermal 3-0 Vicryl and a subcuticular 4-0 Monocryl.  We then turned to the right posterior shoulder mass where in a similar fashion a field block was created with quarter percent Marcaine epinephrine and an elliptical incision was made over the mass following the Langer's lines.  Cautery and blunt dissection were used to then dissect the mass free of surrounding attachments; this appeared to be an irregularly shaped lipomatous mass which was somewhat adherent to the underlying fascia and surrounding top tissue.  Once excised this was handed off for pathology.  Hemostasis was ensured within the wound which was then closed with interrupted deep dermal 3-0 Vicryl and running subcuticular 4 Monocryl.  Benzoin, Steri-Strips and a light pressure dressing  of 4 x 4's and tape were then applied to both incisions.  The patient was then awakened, returned to the supine position and taken to PACU in stable condition.    All counts were correct at the completion of the case.

## 2023-06-25 NOTE — Discharge Instructions (Signed)
 GENERAL SURGERY: POST OP INSTRUCTIONS  DIET: Follow a light bland diet & liquids the first 24 hours after arrival home, such as soup, liquids, starches, etc.  Be sure to drink plenty of fluids.  Quickly advance to a usual solid diet within a few days.  Avoid fast food or heavy meals as your are more likely to get nauseated or have irregular bowels.   Take your usually prescribed home medications unless otherwise directed. PAIN CONTROL: Pain is best controlled by a usual combination of three different methods TOGETHER: Ice/Heat Over the counter pain medication Prescription pain medication Most patients will experience some swelling and bruising around the incisions.  Ice packs or heating pads (30-60 minutes up to 6 times a day) will help. Use ice for the first few days to help decrease swelling and bruising, then switch to heat to help relax tight/sore spots and speed recovery.  Some people prefer to use ice alone, heat alone, alternating between ice & heat.  Experiment to what works for you.  Swelling and bruising can take several weeks to resolve.   It is helpful to take an over-the-counter pain medication regularly for the first few weeks.  Choose one of the following that works best for you: Naproxen (Aleve, etc)  Two 220mg  tabs twice a day OR Ibuprofen (Advil, etc) Three 200mg  tabs four times a day (every meal & bedtime) AND Acetaminophen (Tylenol, etc) 500-650mg  four times a day (every meal & bedtime) A  prescription for pain medication (such as oxycodone, hydrocodone, etc) should be given to you upon discharge.  Take your pain medication as prescribed.  If you are having problems/concerns with the prescription medicine (does not control pain, nausea, vomiting, rash, itching, etc), please call us  [phone] to see if we need to switch you to a different pain medicine that will work better for you and/or control your side effect better. If you need a refill on your pain medication, please  contact your pharmacy.  They will contact our office to request authorization. Prescriptions will not be filled after 5 pm or on week-ends. Avoid getting constipated.  Between the surgery and the pain medications, it is common to experience some constipation.  Increasing fluid intake and taking a fiber supplement (such as Metamucil, Citrucel, FiberCon, MiraLax, etc) 1-2 times a day regularly will usually help prevent this problem from occurring.  A mild laxative (prune juice, Milk of Magnesia, MiraLax, etc) should be taken according to package directions if there are no bowel movements after 48 hours.   Wash / shower every day, starting 2 days after surgery.  You may shower over the steri strips as they are waterproof.  Continue to shower over incision(s) after the dressing is off. No rubbing, scrubbing, lotions or ointments to incisions.  Remove your outer bandage 2 days after surgery; steri strips will peel off after 1-2 weeks.  You may leave the incision open to air.  You may have skin tapes (Steri Strips) covering the incision(s).  Leave them on until one week, then remove.  You may replace a dressing/Band-Aid to cover the incision for comfort if you wish.    ACTIVITIES as tolerated:   You may resume regular (light) daily activities beginning the next day--such as daily self-care, walking, climbing stairs--gradually increasing activities as tolerated.  If you can walk 30 minutes without difficulty, it is safe to try more intense activity such as jogging, treadmill, bicycling, low-impact aerobics, swimming, etc. Save the most intensive and strenuous activity for  last such as sit-ups, heavy lifting, contact sports, etc  Refrain from any heavy lifting or straining until you are off narcotics for pain control.   DO NOT PUSH THROUGH PAIN.  Let pain be your guide: If it hurts to do something, don't do it.  Pain is your body warning you to avoid that activity for another week until the pain goes down. You may  drive when you are no longer taking prescription pain medication, you can comfortably wear a seatbelt, and you can safely maneuver your car and apply brakes. You may have sexual intercourse when it is comfortable.  FOLLOW UP in our office Please call [facility] at [phone] to set up an appointment to see your surgeon in the office for a follow-up appointment approximately 2-3 weeks after your surgery. Make sure that you call for this appointment the day you arrive home to insure a convenient appointment time. 9. IF YOU HAVE DISABILITY OR FAMILY LEAVE FORMS, BRING THEM TO THE OFFICE FOR PROCESSING.  DO NOT GIVE THEM TO YOUR DOCTOR.   WHEN TO CALL US  [phone]: Poor pain control Reactions / problems with new medications (rash/itching, nausea, etc)  Fever over 101.5 F (38.5 C) Worsening swelling or bruising Continued bleeding from incision. Increased pain, redness, or drainage from the incision Difficulty breathing / swallowing   The clinic staff is available to answer your questions during regular business hours (8:30am-5pm).  Please don't hesitate to call and ask to speak to one of our nurses for clinical concerns.   If you have a medical emergency, go to the nearest emergency room or call 911.  A surgeon from [facility] is always on call at the [facility], [place] [address], [place], [place]  [address] ? MAIN: [phone] ? TOLL FREE: [phone] ?  FAX [phone] [email or web address]

## 2023-06-25 NOTE — Interval H&P Note (Signed)
 History and Physical Interval Note:  06/25/2023 10:57 AM  Zachary Rowe  has presented today for surgery, with the diagnosis of SUBCUTANEOUS MASS.  The various methods of treatment have been discussed with the patient and family. After consideration of risks, benefits and other options for treatment, the patient has consented to  Procedure(s) with comments: EXCISION, MASS, UPPER EXTREMITY (Right) - EXCISION OF SUBCUTANEOUS MASS RIGHT SHOULDER EXCISION, MASS, NECK (N/A) - EXCISON OF SUBCUTANEOUS CYST POSTERIOR NECK as a surgical intervention.  The patient's history has been reviewed, patient examined, no change in status, stable for surgery.  I have reviewed the patient's chart and labs.  Questions were answered to the patient's satisfaction.     Alphonzo Devera DELENA Freund

## 2023-06-26 ENCOUNTER — Encounter (HOSPITAL_COMMUNITY): Payer: Self-pay | Admitting: Surgery

## 2023-06-26 LAB — SURGICAL PATHOLOGY

## 2023-09-04 ENCOUNTER — Ambulatory Visit

## 2023-09-16 ENCOUNTER — Other Ambulatory Visit: Payer: Self-pay | Admitting: Family Medicine

## 2023-10-29 ENCOUNTER — Other Ambulatory Visit (HOSPITAL_BASED_OUTPATIENT_CLINIC_OR_DEPARTMENT_OTHER): Payer: Self-pay | Admitting: Emergency Medicine

## 2023-10-29 ENCOUNTER — Telehealth: Payer: Self-pay | Admitting: Family Medicine

## 2023-10-29 NOTE — Telephone Encounter (Signed)
 Copied from CRM [id number]. Topic: Medicare AWV >> Oct 29, 2023  1:29 PM Nathanel DEL wrote: Reason for CRM: Called LVM 10/29/2023 to schedule AWV. Please schedule in office only DUE TO [employer]  Nathanel Paschal; Care Guide Ambulatory Clinical Support [place] [facility] Direct Dial: [phone]

## 2023-10-30 NOTE — Progress Notes (Unsigned)
 Subjective:     Patient ID: Zachary Rowe, male    DOB: [birth year], 68 y.o.   MRN: [id number]  No chief complaint on file.   HPI  Discussed the use of AI scribe software for clinical note transcription with the patient, who gave verbal consent to proceed.  History of Present Illness Zachary Rowe is a 68 year old male who presents for an annual wellness exam and medication review.  He takes vitamin B1, folate, Synthroid  daily, and omeprazole every other day without issues. No refills are needed. He manages hypertension with amlodipine  in the morning and lisinopril  in the evening, maintaining stable blood pressure. Home blood pressure readings are generally normal. His heart rate is consistently around 50 bpm, aligning with his active lifestyle. No recent dizziness or fainting.  No urinary frequency or prostate issues. His last colonoscopy in 2021 showed polyps, and he is due for a repeat. He declined flu and pneumonia vaccines this year.  He is retired, enjoys database administrator, and remains active with home projects and travel.  Assessment and Plan    Follows with: cardiology  Hypertension Medications: lisinopril  40 mg daily, amlodipine  10 mg daily He does monitor home blood pressures. Blood pressures ranging on average from 130/70s He is compliant with medications. Patient has these side effects of medication: none He is adhering to a healthy diet overall.  Aortic atherosclerosis-Aspirin daily  HLD-atorvastatin  40 mg daily  Hypothyroidism-levothyroxine  88 mcg daily  GERD-omeprazole 20 mg daily   ED-follows with urology Cialis  5 mg daily as needed  HCM CRC screen: Last 2021-Hx of colon polyps; Repeat3 years- due Immunizations: Influenza, PNA- Pt declines  History of Present Illness              Health Maintenance Due  Topic Date Due   Pneumococcal Vaccine: 50+ Years (1 of 2 - PCV) Never done   Medicare Annual Wellness (AWV)  05/05/2022   Colonoscopy   07/25/2022    Past Medical History:  Diagnosis Date   Anxiety    Arthritis    Back pain 04/02/2014   Bilateral carotid bruits 11/07/2016   Bradycardia 08/06/2016   Chicken pox as a child   Chronic sinusitis 01/03/2000   Constipation 08/06/2016   Coronary artery disease    Dysrhythmia    bradycardia   Elevated liver enzymes    ETOH abuse    has a couple of shots per day,  was in remission but started back drinking   Family history of liver cancer    Family history of lung cancer    Family history of prostate cancer    Fatigue 11/01/2010   GERD (gastroesophageal reflux disease)    Hard of hearing    wears bilateral hearing aids   Hyperglycemia 08/04/2016   Hyperlipidemia    Hypertension    Hypothyroidism    Measles as a child   Near syncope    several times   Overweight(278.02) 09/02/2010   Personal history of colonic polyps - adenoma 04/17/2007   04/2007 - 3 sigmoid polyps - at least 1 tubular adenoma   Peyronie disease 10/14/2012   Raynaud disease 04/11/2011    Past Surgical History:  Procedure Laterality Date   COLONOSCOPY  03/06/2013   x2 repeated 2020   EXCISION MASS NECK N/A 06/25/2023   Procedure: EXCISION, MASS, NECK;  Surgeon: Signe Mitzie LABOR, MD;  Location: [facility];  Service: General;  Laterality: N/A;  EXCISON OF SUBCUTANEOUS CYST POSTERIOR NECK   EXCISION,  MASS, UPPER EXTREMITY Right 06/25/2023   Procedure: EXCISION, MASS, UPPER EXTREMITY;  Surgeon: Signe Mitzie LABOR, MD;  Location: [facility];  Service: General;  Laterality: Right;  EXCISION OF SUBCUTANEOUS MASS RIGHT SHOULDER   NO PAST SURGERIES     SKIN BIOPSY     BB in forehead    Family History  Problem Relation Age of Onset   Hypertension Mother    Diabetes Mother        borderline   Neuropathy Mother    Macular degeneration Mother    COPD Mother        recent pneumonia   Cancer Mother        lung, refused chemo, tolerated radiation   Lung cancer Mother    Hypertension Father    Diabetes  Father        type 2   Cancer Father 6       liver   Liver cancer Father    Heart disease Father    Diabetes Maternal Grandmother    Parkinson's disease Maternal Grandmother    Emphysema Maternal Grandfather    COPD Maternal Grandfather    Stroke Paternal Grandfather    Cancer Maternal Uncle        prostate cancer   Colon cancer Neg Hx    Esophageal cancer Neg Hx    Rectal cancer Neg Hx    Stomach cancer Neg Hx    Colon polyps Neg Hx     Social History   Socioeconomic History   Marital status: Married    Spouse name: Not on file   Number of children: Not on file   Years of education: Not on file   Highest education level: Some college, no degree  Occupational History   Not on file  Tobacco Use   Smoking status: Former    Current packs/day: 0.00    Types: Cigarettes    Quit date: 01/03/2007    Years since quitting: 16.8   Smokeless tobacco: Former    Types: Chew    Quit date: 2016   Tobacco comments:    dip  Vaping Use   Vaping status: Never Used  Substance and Sexual Activity   Alcohol use: Not Currently    Alcohol/week: 14.0 - 16.0 standard drinks of alcohol    Types: 14 - 16 Shots of liquor per week   Drug use: No   Sexual activity: Yes    Partners: Female    Comment: lives with wife  Other Topics Concern   Not on file  Social History Narrative   Not on file   Social Drivers of Health   Financial Resource Strain: Low Risk  (10/31/2023)   Overall Financial Resource Strain (CARDIA)    Difficulty of Paying Living Expenses: Not hard at all  Food Insecurity: No Food Insecurity (10/31/2023)   Hunger Vital Sign    Worried About Running Out of Food in the Last Year: Never true    Ran Out of Food in the Last Year: Never true  Transportation Needs: No Transportation Needs (10/31/2023)   PRAPARE - Administrator, Civil Service (Medical): No    Lack of Transportation (Non-Medical): No  Physical Activity: Sufficiently Active (10/31/2023)   Exercise  Vital Sign    Days of Exercise per Week: 3 days    Minutes of Exercise per Session: 60 min  Stress: No Stress Concern Present (10/31/2023)   [employer] of Occupational Health - Occupational Stress Questionnaire    Feeling  of Stress: Not at all  Social Connections: Unknown (10/31/2023)   Social Connection and Isolation Panel    Frequency of Communication with Friends and Family: Once a week    Frequency of Social Gatherings with Friends and Family: Patient declined    Attends Religious Services: Never    Database Administrator or Organizations: No    Attends Engineer, Structural: Not on file    Marital Status: Married  Catering Manager Violence: Not on file    Outpatient Medications Prior to Visit  Medication Sig Dispense Refill   amLODipine  (NORVASC ) 10 MG tablet Take 1 tablet by mouth once daily 90 tablet 1   atorvastatin  (LIPITOR) 20 MG tablet TAKE 1 TABLET BY MOUTH ONCE  DAILY 90 tablet 3   Azelastine  HCl 0.15 % SOLN USE 2 SPRAY(S) ONCE DAILY AS NEEDED (Patient taking differently: Place 2 sprays into the nose 2 (two) times daily as needed (congestion).) 30 mL 5   folic acid  (FOLVITE ) 800 MCG tablet Take 400 mcg by mouth daily.     levothyroxine  (SYNTHROID ) 88 MCG tablet Take 1 tablet (88 mcg total) by mouth daily before breakfast. Needs appt 90 tablet 0   lisinopril  (ZESTRIL ) 40 MG tablet Take 1 tablet (40 mg total) by mouth daily. 90 tablet 3   MELATONIN PO Take 1 tablet by mouth at bedtime.     omeprazole (PRILOSEC) 20 MG capsule Take 20 mg by mouth daily.     tadalafil  (CIALIS ) 5 MG tablet Take 1 tablet (5 mg total) by mouth daily as needed for erectile dysfunction. 90 tablet 3   thiamine 250 MG tablet Take 250 mg by mouth daily.     No facility-administered medications prior to visit.    No Known Allergies  ROS See HPI    Objective:    Physical Exam  General: No acute distress. Awake and conversant.  Eyes: Normal conjunctiva, anicteric. Round  symmetric pupils.  ENT: Hearing grossly intact. No nasal discharge.  Respiratory: CTAB. Respirations are non-labored. No wheezing.  Skin: Warm. No rashes or ulcers.  Psych: Alert and oriented. Cooperative, Appropriate mood and affect, Normal judgment.  CV: RRR. No murmur. No lower extremity edema.  MSK: Normal ambulation. No clubbing or cyanosis.  Neuro:  CN II-XII grossly normal.     BP 132/68   Pulse (!) 54   Temp 97.7 F (36.5 C)   Ht 6' 3 (1.905 m)   Wt 215 lb (97.5 kg)   SpO2 100%   BMI 26.87 kg/m  Wt Readings from Last 3 Encounters:  11/01/23 215 lb (97.5 kg)  06/25/23 216 lb (98 kg)  06/21/23 216 lb (98 kg)       Assessment & Plan:   Problem List Items Addressed This Visit     Essential hypertension (Chronic)   Well controlled, no changes to meds. Encouraged heart healthy diet such as the DASH diet and exercise as tolerated.         Relevant Orders   CBC with Differential/Platelet   TSH   History of colonic polyps   Colonoscopy is due. Last colonoscopy in 2021 showed polyps, repeat recommended in 3 years. - Send referral for colonoscopy.      Hyperlipidemia   Tolerating statin. Encourage heart healthy diet such as MIND or DASH diet, increase exercise, avoid trans fats, simple carbohydrates and processed foods, consider a krill or fish or flaxseed oil cap daily.         Relevant Orders  Comprehensive metabolic panel with GFR   Lipid panel   Nocturia   Relevant Orders   PSA   Preventative health care - Primary   Thyroid  disease   On levothyroxine .  Continue to monitor.      Relevant Orders   TSH   Other Visit Diagnoses       Vitamin B1 deficiency       Relevant Orders   Vitamin B1     Low folic acid        Relevant Orders   Folate     Screening for malignant neoplasm of prostate       Relevant Orders   PSA     Colon cancer screening       Relevant Orders   Ambulatory referral to Gastroenterology       Routine wellness visit with  no acute issues. Blood pressure is well-controlled. Declined flu and pneumonia vaccines.  - Continue current medications. - Schedule annual wellness exam  Vitamin B1 deficiency, folate deficiency Currently taking vitamin B1 and folate daily. Lab work to re-evaluate levels. - Perform lab work to check vitamin B1 and folate levels. - Continue current supplementation.    I am having Shyrl FREDRIK Pass maintain his folic acid , thiamine, Azelastine  HCl, omeprazole, MELATONIN PO, tadalafil , atorvastatin , lisinopril , levothyroxine , and amLODipine .  No orders of the defined types were placed in this encounter.

## 2023-11-01 ENCOUNTER — Encounter: Payer: Self-pay | Admitting: Student

## 2023-11-01 ENCOUNTER — Ambulatory Visit: Admitting: Student

## 2023-11-01 VITALS — BP 132/68 | HR 54 | Temp 97.7°F | Ht 75.0 in | Wt 215.0 lb

## 2023-11-01 DIAGNOSIS — R351 Nocturia: Secondary | ICD-10-CM | POA: Diagnosis not present

## 2023-11-01 DIAGNOSIS — Z860100 Personal history of colon polyps, unspecified: Secondary | ICD-10-CM

## 2023-11-01 DIAGNOSIS — Z0000 Encounter for general adult medical examination without abnormal findings: Secondary | ICD-10-CM

## 2023-11-01 DIAGNOSIS — E538 Deficiency of other specified B group vitamins: Secondary | ICD-10-CM

## 2023-11-01 DIAGNOSIS — E079 Disorder of thyroid, unspecified: Secondary | ICD-10-CM | POA: Diagnosis not present

## 2023-11-01 DIAGNOSIS — I10 Essential (primary) hypertension: Secondary | ICD-10-CM

## 2023-11-01 DIAGNOSIS — E519 Thiamine deficiency, unspecified: Secondary | ICD-10-CM

## 2023-11-01 DIAGNOSIS — Z1211 Encounter for screening for malignant neoplasm of colon: Secondary | ICD-10-CM

## 2023-11-01 DIAGNOSIS — Z125 Encounter for screening for malignant neoplasm of prostate: Secondary | ICD-10-CM

## 2023-11-01 DIAGNOSIS — E7800 Pure hypercholesterolemia, unspecified: Secondary | ICD-10-CM | POA: Diagnosis not present

## 2023-11-01 LAB — CBC WITH DIFFERENTIAL/PLATELET
Basophils Absolute: 0 K/uL (ref 0.0–0.1)
Basophils Relative: 0.8 % (ref 0.0–3.0)
Eosinophils Absolute: 0.2 K/uL (ref 0.0–0.7)
Eosinophils Relative: 4.2 % (ref 0.0–5.0)
HCT: 39.7 % (ref 39.0–52.0)
Hemoglobin: 13.5 g/dL (ref 13.0–17.0)
Lymphocytes Relative: 29.1 % (ref 12.0–46.0)
Lymphs Abs: 1.6 K/uL (ref 0.7–4.0)
MCHC: 33.9 g/dL (ref 30.0–36.0)
MCV: 99.9 fl (ref 78.0–100.0)
Monocytes Absolute: 0.7 K/uL (ref 0.1–1.0)
Monocytes Relative: 12.5 % — ABNORMAL HIGH (ref 3.0–12.0)
Neutro Abs: 3 K/uL (ref 1.4–7.7)
Neutrophils Relative %: 53.4 % (ref 43.0–77.0)
Platelets: 241 K/uL (ref 150.0–400.0)
RBC: 3.98 Mil/uL — ABNORMAL LOW (ref 4.22–5.81)
RDW: 13.6 % (ref 11.5–15.5)
WBC: 5.6 K/uL (ref 4.0–10.5)

## 2023-11-01 LAB — LIPID PANEL
Cholesterol: 174 mg/dL (ref 0–200)
HDL: 92.4 mg/dL (ref >39.00)
LDL Cholesterol: 70 mg/dL (ref 0–99)
NonHDL: 81.8
Total CHOL/HDL Ratio: 2
Triglycerides: 59 mg/dL (ref 0.0–149.0)
VLDL: 11.8 mg/dL (ref 0.0–40.0)

## 2023-11-01 LAB — COMPREHENSIVE METABOLIC PANEL WITH GFR
ALT: 58 U/L — ABNORMAL HIGH (ref 0–53)
AST: 56 U/L — ABNORMAL HIGH (ref 0–37)
Albumin: 4.6 g/dL (ref 3.5–5.2)
Alkaline Phosphatase: 46 U/L (ref 39–117)
BUN: 21 mg/dL (ref 6–23)
CO2: 28 meq/L (ref 19–32)
Calcium: 9.6 mg/dL (ref 8.4–10.5)
Chloride: 101 meq/L (ref 96–112)
Creatinine, Ser: 1.48 mg/dL (ref 0.40–1.50)
GFR: 48.17 mL/min — ABNORMAL LOW (ref >60.00)
Glucose, Bld: 99 mg/dL (ref 70–99)
Potassium: 4.6 meq/L (ref 3.5–5.1)
Sodium: 139 meq/L (ref 135–145)
Total Bilirubin: 0.9 mg/dL (ref 0.2–1.2)
Total Protein: 7.2 g/dL (ref 6.0–8.3)

## 2023-11-01 NOTE — Assessment & Plan Note (Signed)
 Tolerating statin. Encourage heart healthy diet such as MIND or DASH diet, increase exercise, avoid trans fats, simple carbohydrates and processed foods, consider a krill or fish or flaxseed oil cap daily.

## 2023-11-01 NOTE — Assessment & Plan Note (Signed)
 Colonoscopy is due. Last colonoscopy in 2021 showed polyps, repeat recommended in 3 years. - Send referral for colonoscopy.

## 2023-11-01 NOTE — Assessment & Plan Note (Signed)
 On levothyroxine .  Continue to monitor.

## 2023-11-01 NOTE — Assessment & Plan Note (Signed)
 Well controlled, no changes to meds. Encouraged heart healthy diet such as the DASH diet and exercise as tolerated.

## 2023-11-02 ENCOUNTER — Ambulatory Visit: Payer: Self-pay | Admitting: Student

## 2023-11-02 LAB — PSA: PSA: 0.84 ng/mL (ref 0.10–4.00)

## 2023-11-02 LAB — FOLATE: Folate: >23.7 ng/mL (ref >5.9)

## 2023-11-02 LAB — TSH: TSH: 3.78 u[IU]/mL (ref 0.35–5.50)

## 2023-11-06 LAB — VITAMIN B1: Vitamin B1 (Thiamine): 150 nmol/L — ABNORMAL HIGH (ref 8–30)

## 2023-11-20 ENCOUNTER — Ambulatory Visit

## 2023-12-14 ENCOUNTER — Other Ambulatory Visit: Payer: Self-pay | Admitting: Family Medicine

## 2024-01-15 ENCOUNTER — Other Ambulatory Visit: Payer: Self-pay

## 2024-01-15 DIAGNOSIS — E7800 Pure hypercholesterolemia, unspecified: Secondary | ICD-10-CM

## 2024-01-15 MED ORDER — LEVOTHYROXINE SODIUM 88 MCG PO TABS: 88.0000 ug | ORAL_TABLET | Freq: Every day | ORAL | 3 refills | Status: AC

## 2024-01-16 NOTE — Telephone Encounter (Signed)
 In accordance with refill protocols, please review and address the following requirements before this medication refill can be authorized:  Labs

## 2024-01-18 MED ORDER — ATORVASTATIN CALCIUM 20 MG PO TABS: 20.0000 mg | ORAL_TABLET | Freq: Every day | ORAL | 1 refills | Status: AC
